# Patient Record
Sex: Female | Born: 1955 | Race: White | Hispanic: No | Marital: Married | Smoking: Never smoker
Health system: Southern US, Community
[De-identification: ages and names within clinical notes are randomized; demographics above are authoritative.]

## PROBLEM LIST (undated history)

## (undated) DIAGNOSIS — Z8619 Personal history of other infectious and parasitic diseases: Secondary | ICD-10-CM

## (undated) DIAGNOSIS — K8020 Calculus of gallbladder without cholecystitis without obstruction: Secondary | ICD-10-CM

## (undated) DIAGNOSIS — C50919 Malignant neoplasm of unspecified site of unspecified female breast: Secondary | ICD-10-CM

## (undated) DIAGNOSIS — Z9889 Other specified postprocedural states: Secondary | ICD-10-CM

## (undated) DIAGNOSIS — Z923 Personal history of irradiation: Secondary | ICD-10-CM

## (undated) DIAGNOSIS — Z853 Personal history of malignant neoplasm of breast: Secondary | ICD-10-CM

## (undated) DIAGNOSIS — Z9221 Personal history of antineoplastic chemotherapy: Secondary | ICD-10-CM

## (undated) DIAGNOSIS — Z8679 Personal history of other diseases of the circulatory system: Secondary | ICD-10-CM

## (undated) HISTORY — PX: OTHER SURGICAL HISTORY: SHX169

## (undated) HISTORY — DX: Calculus of gallbladder without cholecystitis without obstruction: K80.20

## (undated) HISTORY — DX: Personal history of malignant neoplasm of breast: Z85.3

## (undated) HISTORY — DX: Malignant neoplasm of unspecified site of unspecified female breast: C50.919

## (undated) HISTORY — PX: COLONOSCOPY: SHX174

## (undated) HISTORY — DX: Personal history of other infectious and parasitic diseases: Z86.19

## (undated) HISTORY — DX: Personal history of other diseases of the circulatory system: Z86.79

---

## 1999-09-04 ENCOUNTER — Other Ambulatory Visit: Admission: RE | Admit: 1999-09-04 | Discharge: 1999-09-04 | Payer: Self-pay | Admitting: Internal Medicine

## 2000-03-10 DIAGNOSIS — Z9221 Personal history of antineoplastic chemotherapy: Secondary | ICD-10-CM

## 2000-03-10 DIAGNOSIS — Z923 Personal history of irradiation: Secondary | ICD-10-CM

## 2000-03-10 HISTORY — DX: Personal history of irradiation: Z92.3

## 2000-03-10 HISTORY — DX: Personal history of antineoplastic chemotherapy: Z92.21

## 2000-03-10 HISTORY — PX: BREAST LUMPECTOMY: SHX2

## 2000-08-26 ENCOUNTER — Encounter: Payer: Self-pay | Admitting: Internal Medicine

## 2000-08-26 ENCOUNTER — Encounter: Admission: RE | Admit: 2000-08-26 | Discharge: 2000-08-26 | Payer: Self-pay | Admitting: Internal Medicine

## 2000-08-26 ENCOUNTER — Encounter (INDEPENDENT_AMBULATORY_CARE_PROVIDER_SITE_OTHER): Payer: Self-pay | Admitting: *Deleted

## 2000-08-26 ENCOUNTER — Other Ambulatory Visit: Admission: RE | Admit: 2000-08-26 | Discharge: 2000-08-26 | Payer: Self-pay | Admitting: Internal Medicine

## 2000-09-02 ENCOUNTER — Encounter: Admission: RE | Admit: 2000-09-02 | Discharge: 2000-09-02 | Payer: Self-pay | Admitting: General Surgery

## 2000-09-02 ENCOUNTER — Encounter: Payer: Self-pay | Admitting: General Surgery

## 2000-09-04 ENCOUNTER — Encounter (INDEPENDENT_AMBULATORY_CARE_PROVIDER_SITE_OTHER): Payer: Self-pay | Admitting: Specialist

## 2000-09-04 ENCOUNTER — Encounter: Payer: Self-pay | Admitting: General Surgery

## 2000-09-04 ENCOUNTER — Ambulatory Visit (HOSPITAL_BASED_OUTPATIENT_CLINIC_OR_DEPARTMENT_OTHER): Admission: RE | Admit: 2000-09-04 | Discharge: 2000-09-04 | Payer: Self-pay | Admitting: General Surgery

## 2000-09-11 ENCOUNTER — Ambulatory Visit: Admission: RE | Admit: 2000-09-11 | Discharge: 2000-12-10 | Payer: Self-pay | Admitting: Radiation Oncology

## 2000-09-29 ENCOUNTER — Encounter: Payer: Self-pay | Admitting: *Deleted

## 2000-09-29 ENCOUNTER — Encounter: Admission: RE | Admit: 2000-09-29 | Discharge: 2000-09-29 | Payer: Self-pay | Admitting: *Deleted

## 2000-10-02 ENCOUNTER — Encounter: Payer: Self-pay | Admitting: *Deleted

## 2000-10-02 ENCOUNTER — Ambulatory Visit (HOSPITAL_COMMUNITY): Admission: RE | Admit: 2000-10-02 | Discharge: 2000-10-02 | Payer: Self-pay | Admitting: *Deleted

## 2000-10-05 ENCOUNTER — Encounter: Payer: Self-pay | Admitting: General Surgery

## 2000-10-05 ENCOUNTER — Ambulatory Visit (HOSPITAL_BASED_OUTPATIENT_CLINIC_OR_DEPARTMENT_OTHER): Admission: RE | Admit: 2000-10-05 | Discharge: 2000-10-05 | Payer: Self-pay | Admitting: General Surgery

## 2000-10-26 ENCOUNTER — Other Ambulatory Visit: Admission: RE | Admit: 2000-10-26 | Discharge: 2000-10-26 | Payer: Self-pay | Admitting: Internal Medicine

## 2001-01-06 ENCOUNTER — Ambulatory Visit: Admission: RE | Admit: 2001-01-06 | Discharge: 2001-04-06 | Payer: Self-pay | Admitting: Radiation Oncology

## 2001-02-12 ENCOUNTER — Ambulatory Visit (HOSPITAL_BASED_OUTPATIENT_CLINIC_OR_DEPARTMENT_OTHER): Admission: RE | Admit: 2001-02-12 | Discharge: 2001-02-12 | Payer: Self-pay | Admitting: General Surgery

## 2001-08-31 ENCOUNTER — Encounter: Admission: RE | Admit: 2001-08-31 | Discharge: 2001-08-31 | Payer: Self-pay | Admitting: *Deleted

## 2001-08-31 ENCOUNTER — Encounter: Payer: Self-pay | Admitting: *Deleted

## 2001-11-03 ENCOUNTER — Other Ambulatory Visit: Admission: RE | Admit: 2001-11-03 | Discharge: 2001-11-03 | Payer: Self-pay | Admitting: Internal Medicine

## 2002-09-02 ENCOUNTER — Encounter: Admission: RE | Admit: 2002-09-02 | Discharge: 2002-09-02 | Payer: Self-pay | Admitting: *Deleted

## 2002-09-02 ENCOUNTER — Encounter: Payer: Self-pay | Admitting: *Deleted

## 2002-11-16 ENCOUNTER — Other Ambulatory Visit: Admission: RE | Admit: 2002-11-16 | Discharge: 2002-11-16 | Payer: Self-pay | Admitting: Internal Medicine

## 2003-03-07 ENCOUNTER — Encounter: Admission: RE | Admit: 2003-03-07 | Discharge: 2003-03-07 | Payer: Self-pay | Admitting: Oncology

## 2003-09-04 ENCOUNTER — Encounter: Admission: RE | Admit: 2003-09-04 | Discharge: 2003-09-04 | Payer: Self-pay | Admitting: Oncology

## 2004-01-05 ENCOUNTER — Other Ambulatory Visit: Admission: RE | Admit: 2004-01-05 | Discharge: 2004-01-05 | Payer: Self-pay | Admitting: Internal Medicine

## 2004-01-12 ENCOUNTER — Encounter: Admission: RE | Admit: 2004-01-12 | Discharge: 2004-01-12 | Payer: Self-pay | Admitting: Internal Medicine

## 2004-01-12 ENCOUNTER — Encounter: Payer: Self-pay | Admitting: Internal Medicine

## 2004-02-07 ENCOUNTER — Ambulatory Visit: Payer: Self-pay | Admitting: Oncology

## 2004-03-21 ENCOUNTER — Ambulatory Visit (HOSPITAL_COMMUNITY): Admission: RE | Admit: 2004-03-21 | Discharge: 2004-03-21 | Payer: Self-pay | Admitting: Oncology

## 2004-03-29 ENCOUNTER — Ambulatory Visit: Payer: Self-pay | Admitting: Oncology

## 2004-04-01 ENCOUNTER — Ambulatory Visit (HOSPITAL_COMMUNITY): Admission: RE | Admit: 2004-04-01 | Discharge: 2004-04-01 | Payer: Self-pay | Admitting: Oncology

## 2004-06-25 ENCOUNTER — Ambulatory Visit: Payer: Self-pay | Admitting: Oncology

## 2004-06-26 ENCOUNTER — Ambulatory Visit (HOSPITAL_COMMUNITY): Admission: RE | Admit: 2004-06-26 | Discharge: 2004-06-26 | Payer: Self-pay | Admitting: Oncology

## 2004-09-04 ENCOUNTER — Encounter: Admission: RE | Admit: 2004-09-04 | Discharge: 2004-09-04 | Payer: Self-pay | Admitting: Internal Medicine

## 2004-11-06 ENCOUNTER — Ambulatory Visit: Payer: Self-pay | Admitting: Internal Medicine

## 2004-11-12 ENCOUNTER — Encounter: Payer: Self-pay | Admitting: Internal Medicine

## 2004-11-12 ENCOUNTER — Other Ambulatory Visit: Admission: RE | Admit: 2004-11-12 | Discharge: 2004-11-12 | Payer: Self-pay | Admitting: Internal Medicine

## 2004-11-12 ENCOUNTER — Ambulatory Visit: Payer: Self-pay | Admitting: Internal Medicine

## 2004-12-31 ENCOUNTER — Ambulatory Visit: Payer: Self-pay | Admitting: Oncology

## 2005-01-08 HISTORY — PX: PARATHYROIDECTOMY: SHX19

## 2005-01-20 ENCOUNTER — Ambulatory Visit: Payer: Self-pay | Admitting: Family Medicine

## 2005-01-20 ENCOUNTER — Ambulatory Visit: Payer: Self-pay | Admitting: Internal Medicine

## 2005-02-17 ENCOUNTER — Ambulatory Visit: Payer: Self-pay | Admitting: Internal Medicine

## 2005-02-19 ENCOUNTER — Encounter (HOSPITAL_COMMUNITY): Admission: RE | Admit: 2005-02-19 | Discharge: 2005-05-20 | Payer: Self-pay | Admitting: Internal Medicine

## 2005-04-14 ENCOUNTER — Encounter (INDEPENDENT_AMBULATORY_CARE_PROVIDER_SITE_OTHER): Payer: Self-pay | Admitting: Specialist

## 2005-04-14 ENCOUNTER — Ambulatory Visit (HOSPITAL_COMMUNITY): Admission: RE | Admit: 2005-04-14 | Discharge: 2005-04-14 | Payer: Self-pay | Admitting: Surgery

## 2005-07-01 ENCOUNTER — Ambulatory Visit: Payer: Self-pay | Admitting: Oncology

## 2005-08-15 LAB — CBC WITH DIFFERENTIAL/PLATELET
BASO%: 0.4 % (ref 0.0–2.0)
Eosinophils Absolute: 0.1 10*3/uL (ref 0.0–0.5)
MONO#: 0.4 10*3/uL (ref 0.1–0.9)
NEUT#: 3.7 10*3/uL (ref 1.5–6.5)
RBC: 4.47 10*6/uL (ref 3.70–5.32)
RDW: 13.7 % (ref 11.3–14.5)
WBC: 6.2 10*3/uL (ref 3.9–10.0)

## 2005-08-15 LAB — COMPREHENSIVE METABOLIC PANEL
ALT: 36 U/L (ref 0–40)
Albumin: 4.4 g/dL (ref 3.5–5.2)
Alkaline Phosphatase: 132 U/L — ABNORMAL HIGH (ref 39–117)
CO2: 31 mEq/L (ref 19–32)
Glucose, Bld: 110 mg/dL — ABNORMAL HIGH (ref 70–99)
Potassium: 4.3 mEq/L (ref 3.5–5.3)
Sodium: 146 mEq/L — ABNORMAL HIGH (ref 135–145)
Total Protein: 7.1 g/dL (ref 6.0–8.3)

## 2005-08-21 ENCOUNTER — Encounter: Admission: RE | Admit: 2005-08-21 | Discharge: 2005-08-21 | Payer: Self-pay | Admitting: Oncology

## 2005-08-24 ENCOUNTER — Encounter: Admission: RE | Admit: 2005-08-24 | Discharge: 2005-08-24 | Payer: Self-pay | Admitting: Oncology

## 2005-12-26 ENCOUNTER — Ambulatory Visit: Payer: Self-pay | Admitting: Internal Medicine

## 2005-12-26 LAB — CONVERTED CEMR LAB
ALT: 41 units/L — ABNORMAL HIGH (ref 0–40)
AST: 31 units/L (ref 0–37)
Albumin: 4.1 g/dL (ref 3.5–5.2)
Alkaline Phosphatase: 113 units/L (ref 39–117)
BUN: 16 mg/dL (ref 6–23)
Basophils Absolute: 0 10*3/uL (ref 0.0–0.1)
Basophils Relative: 0.7 % (ref 0.0–1.0)
CO2: 28 meq/L (ref 19–32)
Calcium: 9.7 mg/dL (ref 8.4–10.5)
Chloride: 106 meq/L (ref 96–112)
Chol/HDL Ratio, serum: 3.4
Cholesterol: 148 mg/dL (ref 0–200)
Creatinine, Ser: 0.8 mg/dL (ref 0.4–1.2)
Eosinophil percent: 1.9 % (ref 0.0–5.0)
GFR calc non Af Amer: 81 mL/min
Glomerular Filtration Rate, Af Am: 98 mL/min/{1.73_m2}
Glucose, Bld: 89 mg/dL (ref 70–99)
HCT: 40.7 % (ref 36.0–46.0)
HDL: 43.2 mg/dL (ref >39.0)
Hemoglobin: 13.3 g/dL (ref 12.0–15.0)
LDL Cholesterol: 92 mg/dL (ref 0–99)
Lymphocytes Relative: 28.8 % (ref 12.0–46.0)
MCHC: 32.7 g/dL (ref 30.0–36.0)
MCV: 86.7 fL (ref 78.0–100.0)
Monocytes Absolute: 0.5 10*3/uL (ref 0.2–0.7)
Monocytes Relative: 7.8 % (ref 3.0–11.0)
Neutro Abs: 4.2 10*3/uL (ref 1.4–7.7)
Neutrophils Relative %: 60.8 % (ref 43.0–77.0)
Platelets: 339 10*3/uL (ref 150–400)
Potassium: 4.4 meq/L (ref 3.5–5.1)
RBC: 4.7 M/uL (ref 3.87–5.11)
RDW: 13.1 % (ref 11.5–14.6)
Sodium: 140 meq/L (ref 135–145)
TSH: 1.2 microintl units/mL (ref 0.35–5.50)
Total Bilirubin: 0.4 mg/dL (ref 0.3–1.2)
Total Protein: 7.2 g/dL (ref 6.0–8.3)
Triglyceride fasting, serum: 62 mg/dL (ref 0–149)
VLDL: 12 mg/dL (ref 0–40)
WBC: 6.7 10*3/uL (ref 4.5–10.5)

## 2006-01-02 ENCOUNTER — Other Ambulatory Visit: Admission: RE | Admit: 2006-01-02 | Discharge: 2006-01-02 | Payer: Self-pay | Admitting: Internal Medicine

## 2006-01-02 ENCOUNTER — Encounter: Payer: Self-pay | Admitting: Internal Medicine

## 2006-01-02 ENCOUNTER — Ambulatory Visit: Payer: Self-pay | Admitting: Internal Medicine

## 2006-02-10 ENCOUNTER — Ambulatory Visit: Payer: Self-pay | Admitting: Oncology

## 2006-02-13 ENCOUNTER — Encounter: Payer: Self-pay | Admitting: *Deleted

## 2006-02-13 LAB — CBC WITH DIFFERENTIAL/PLATELET
Eosinophils Absolute: 0.1 10*3/uL (ref 0.0–0.5)
LYMPH%: 31.5 % (ref 14.0–48.0)
MONO#: 0.5 10*3/uL (ref 0.1–0.9)
NEUT#: 4.5 10*3/uL (ref 1.5–6.5)
Platelets: 390 10*3/uL (ref 145–400)
RBC: 4.64 10*6/uL (ref 3.70–5.32)
WBC: 7.5 10*3/uL (ref 3.9–10.0)
lymph#: 2.4 10*3/uL (ref 0.9–3.3)

## 2006-02-13 LAB — COMPREHENSIVE METABOLIC PANEL
Albumin: 4.7 g/dL (ref 3.5–5.2)
CO2: 28 mEq/L (ref 19–32)
Calcium: 10.8 mg/dL — ABNORMAL HIGH (ref 8.4–10.5)
Chloride: 100 mEq/L (ref 96–112)
Glucose, Bld: 99 mg/dL (ref 70–99)
Potassium: 4.2 mEq/L (ref 3.5–5.3)
Sodium: 139 mEq/L (ref 135–145)
Total Protein: 7.4 g/dL (ref 6.0–8.3)

## 2006-02-13 LAB — CONVERTED CEMR LAB: Calcium: 10.8 mg/dL

## 2006-08-11 ENCOUNTER — Ambulatory Visit: Payer: Self-pay | Admitting: Oncology

## 2006-08-26 ENCOUNTER — Encounter: Admission: RE | Admit: 2006-08-26 | Discharge: 2006-08-26 | Payer: Self-pay | Admitting: Oncology

## 2006-10-05 ENCOUNTER — Ambulatory Visit: Payer: Self-pay | Admitting: Oncology

## 2006-11-05 DIAGNOSIS — M810 Age-related osteoporosis without current pathological fracture: Secondary | ICD-10-CM | POA: Insufficient documentation

## 2006-11-05 DIAGNOSIS — J309 Allergic rhinitis, unspecified: Secondary | ICD-10-CM | POA: Insufficient documentation

## 2006-12-01 ENCOUNTER — Ambulatory Visit: Payer: Self-pay | Admitting: Internal Medicine

## 2006-12-01 LAB — CONVERTED CEMR LAB
ALT: 51 units/L — ABNORMAL HIGH (ref 0–35)
AST: 37 units/L (ref 0–37)
Albumin: 4.2 g/dL (ref 3.5–5.2)
Alkaline Phosphatase: 113 units/L (ref 39–117)
BUN: 9 mg/dL (ref 6–23)
Basophils Absolute: 0 10*3/uL (ref 0.0–0.1)
Basophils Relative: 0 % (ref 0.0–1.0)
Bilirubin Urine: NEGATIVE
Bilirubin, Direct: 0.1 mg/dL (ref 0.0–0.3)
CO2: 29 meq/L (ref 19–32)
Calcium: 10.1 mg/dL (ref 8.4–10.5)
Chloride: 105 meq/L (ref 96–112)
Cholesterol: 164 mg/dL (ref 0–200)
Creatinine, Ser: 0.7 mg/dL (ref 0.4–1.2)
Eosinophils Absolute: 0.2 10*3/uL (ref 0.0–0.6)
Eosinophils Relative: 2.4 % (ref 0.0–5.0)
GFR calc Af Amer: 114 mL/min
GFR calc non Af Amer: 95 mL/min
Glucose, Bld: 89 mg/dL (ref 70–99)
Glucose, Urine, Semiquant: NEGATIVE
HCT: 37.7 % (ref 36.0–46.0)
HDL: 43.5 mg/dL (ref >39.0)
Hemoglobin: 13.2 g/dL (ref 12.0–15.0)
Ketones, urine, test strip: NEGATIVE
LDL Cholesterol: 100 mg/dL — ABNORMAL HIGH (ref 0–99)
Lymphocytes Relative: 27.4 % (ref 12.0–46.0)
MCHC: 35.1 g/dL (ref 30.0–36.0)
MCV: 86.4 fL (ref 78.0–100.0)
Monocytes Absolute: 0.6 10*3/uL (ref 0.2–0.7)
Monocytes Relative: 8.4 % (ref 3.0–11.0)
Neutro Abs: 4.5 10*3/uL (ref 1.4–7.7)
Neutrophils Relative %: 61.8 % (ref 43.0–77.0)
Nitrite: NEGATIVE
Platelets: 354 10*3/uL (ref 150–400)
Potassium: 4.6 meq/L (ref 3.5–5.1)
Protein, U semiquant: NEGATIVE
RBC: 4.36 M/uL (ref 3.87–5.11)
RDW: 13.2 % (ref 11.5–14.6)
Sodium: 141 meq/L (ref 135–145)
Specific Gravity, Urine: 1.025
TSH: 1.27 microintl units/mL (ref 0.35–5.50)
Total Bilirubin: 0.6 mg/dL (ref 0.3–1.2)
Total CHOL/HDL Ratio: 3.8
Total Protein: 7 g/dL (ref 6.0–8.3)
Triglycerides: 105 mg/dL (ref 0–149)
Urobilinogen, UA: 0.2
VLDL: 21 mg/dL (ref 0–40)
WBC Urine, dipstick: NEGATIVE
WBC: 7.3 10*3/uL (ref 4.5–10.5)
pH: 5.5

## 2006-12-08 ENCOUNTER — Encounter: Payer: Self-pay | Admitting: Internal Medicine

## 2006-12-08 ENCOUNTER — Ambulatory Visit: Payer: Self-pay | Admitting: Internal Medicine

## 2006-12-08 ENCOUNTER — Other Ambulatory Visit: Admission: RE | Admit: 2006-12-08 | Discharge: 2006-12-08 | Payer: Self-pay | Admitting: Internal Medicine

## 2006-12-08 DIAGNOSIS — R740 Nonspecific elevation of levels of transaminase and lactic acid dehydrogenase [LDH]: Secondary | ICD-10-CM

## 2006-12-08 DIAGNOSIS — R7401 Elevation of levels of liver transaminase levels: Secondary | ICD-10-CM | POA: Insufficient documentation

## 2007-03-17 ENCOUNTER — Ambulatory Visit: Payer: Self-pay | Admitting: Internal Medicine

## 2007-03-17 ENCOUNTER — Encounter: Payer: Self-pay | Admitting: Internal Medicine

## 2007-03-19 ENCOUNTER — Ambulatory Visit: Payer: Self-pay | Admitting: Internal Medicine

## 2007-03-19 LAB — CONVERTED CEMR LAB
HCV Ab: NEGATIVE
Hep B C IgM: NEGATIVE
Hep B Core Total Ab: NEGATIVE
Hep B S Ab: NEGATIVE
Hepatitis B Surface Ag: NEGATIVE
Vit D, 1,25-Dihydroxy: 46 (ref 30–89)

## 2007-03-21 LAB — CONVERTED CEMR LAB
ALT: 23 units/L (ref 0–35)
AST: 21 units/L (ref 0–37)
Albumin: 4.1 g/dL (ref 3.5–5.2)
Alkaline Phosphatase: 104 units/L (ref 39–117)
Bilirubin, Direct: 0.1 mg/dL (ref 0.0–0.3)
Ferritin: 56.9 ng/mL (ref 10.0–291.0)
Total Bilirubin: 0.7 mg/dL (ref 0.3–1.2)
Total Protein: 6.9 g/dL (ref 6.0–8.3)

## 2007-03-31 ENCOUNTER — Ambulatory Visit: Payer: Self-pay | Admitting: Internal Medicine

## 2007-03-31 DIAGNOSIS — M899 Disorder of bone, unspecified: Secondary | ICD-10-CM | POA: Insufficient documentation

## 2007-03-31 DIAGNOSIS — Z8719 Personal history of other diseases of the digestive system: Secondary | ICD-10-CM | POA: Insufficient documentation

## 2007-03-31 DIAGNOSIS — M949 Disorder of cartilage, unspecified: Secondary | ICD-10-CM

## 2007-08-18 ENCOUNTER — Ambulatory Visit: Payer: Self-pay | Admitting: Internal Medicine

## 2007-08-30 ENCOUNTER — Encounter: Admission: RE | Admit: 2007-08-30 | Discharge: 2007-08-30 | Payer: Self-pay | Admitting: Oncology

## 2007-09-21 ENCOUNTER — Telehealth (INDEPENDENT_AMBULATORY_CARE_PROVIDER_SITE_OTHER): Payer: Self-pay | Admitting: *Deleted

## 2007-10-24 ENCOUNTER — Ambulatory Visit: Payer: Self-pay | Admitting: Oncology

## 2007-10-29 ENCOUNTER — Encounter: Payer: Self-pay | Admitting: *Deleted

## 2007-10-29 ENCOUNTER — Encounter: Payer: Self-pay | Admitting: Internal Medicine

## 2007-10-29 LAB — CONVERTED CEMR LAB
ALT: 29 units/L
AST: 21 units/L
Calcium: 10.7 mg/dL

## 2007-10-29 LAB — COMPREHENSIVE METABOLIC PANEL
CO2: 28 mEq/L (ref 19–32)
Creatinine, Ser: 0.75 mg/dL (ref 0.40–1.20)
Glucose, Bld: 95 mg/dL (ref 70–99)
Sodium: 140 mEq/L (ref 135–145)
Total Bilirubin: 0.4 mg/dL (ref 0.3–1.2)
Total Protein: 7.1 g/dL (ref 6.0–8.3)

## 2007-10-29 LAB — CBC WITH DIFFERENTIAL/PLATELET
Eosinophils Absolute: 0.1 10*3/uL (ref 0.0–0.5)
HCT: 38.2 % (ref 34.8–46.6)
HGB: 13 g/dL (ref 11.6–15.9)
LYMPH%: 28.2 % (ref 14.0–48.0)
MONO#: 0.5 10*3/uL (ref 0.1–0.9)
NEUT#: 4.4 10*3/uL (ref 1.5–6.5)
NEUT%: 62.3 % (ref 39.6–76.8)
Platelets: 324 10*3/uL (ref 145–400)
WBC: 7 10*3/uL (ref 3.9–10.0)

## 2008-04-06 ENCOUNTER — Ambulatory Visit: Payer: Self-pay | Admitting: Internal Medicine

## 2008-04-06 LAB — CONVERTED CEMR LAB
ALT: 23 units/L (ref 0–35)
AST: 23 units/L (ref 0–37)
Albumin: 4.1 g/dL (ref 3.5–5.2)
Alkaline Phosphatase: 76 units/L (ref 39–117)
BUN: 15 mg/dL (ref 6–23)
Basophils Absolute: 0 10*3/uL (ref 0.0–0.1)
Basophils Relative: 0.2 % (ref 0.0–3.0)
Bilirubin Urine: NEGATIVE
Bilirubin, Direct: 0.1 mg/dL (ref 0.0–0.3)
Blood in Urine, dipstick: NEGATIVE
CO2: 32 meq/L (ref 19–32)
Calcium: 9.9 mg/dL (ref 8.4–10.5)
Chloride: 106 meq/L (ref 96–112)
Cholesterol: 149 mg/dL (ref 0–200)
Creatinine, Ser: 0.7 mg/dL (ref 0.4–1.2)
Eosinophils Absolute: 0.1 10*3/uL (ref 0.0–0.7)
Eosinophils Relative: 2.1 % (ref 0.0–5.0)
GFR calc Af Amer: 114 mL/min
GFR calc non Af Amer: 94 mL/min
Glucose, Bld: 89 mg/dL (ref 70–99)
Glucose, Urine, Semiquant: NEGATIVE
HCT: 40.4 % (ref 36.0–46.0)
HDL: 47.2 mg/dL (ref >39.0)
Hemoglobin: 13.7 g/dL (ref 12.0–15.0)
LDL Cholesterol: 86 mg/dL (ref 0–99)
Lymphocytes Relative: 29.1 % (ref 12.0–46.0)
MCHC: 33.8 g/dL (ref 30.0–36.0)
MCV: 87.7 fL (ref 78.0–100.0)
Monocytes Absolute: 0.5 10*3/uL (ref 0.1–1.0)
Monocytes Relative: 8.8 % (ref 3.0–12.0)
Neutro Abs: 3.6 10*3/uL (ref 1.4–7.7)
Neutrophils Relative %: 59.8 % (ref 43.0–77.0)
Nitrite: NEGATIVE
Platelets: 303 10*3/uL (ref 150–400)
Potassium: 4.5 meq/L (ref 3.5–5.1)
RBC: 4.61 M/uL (ref 3.87–5.11)
RDW: 13 % (ref 11.5–14.6)
Sodium: 144 meq/L (ref 135–145)
Specific Gravity, Urine: 1.02
TSH: 0.83 microintl units/mL (ref 0.35–5.50)
Total Bilirubin: 0.8 mg/dL (ref 0.3–1.2)
Total CHOL/HDL Ratio: 3.2
Total Protein: 7.2 g/dL (ref 6.0–8.3)
Triglycerides: 81 mg/dL (ref 0–149)
Urobilinogen, UA: 0.2
VLDL: 16 mg/dL (ref 0–40)
WBC: 5.9 10*3/uL (ref 4.5–10.5)
pH: 7

## 2008-04-17 ENCOUNTER — Ambulatory Visit: Payer: Self-pay | Admitting: Internal Medicine

## 2008-04-19 ENCOUNTER — Encounter: Payer: Self-pay | Admitting: Internal Medicine

## 2008-04-19 ENCOUNTER — Other Ambulatory Visit: Admission: RE | Admit: 2008-04-19 | Discharge: 2008-04-19 | Payer: Self-pay | Admitting: Internal Medicine

## 2008-04-26 ENCOUNTER — Ambulatory Visit: Payer: Self-pay | Admitting: Oncology

## 2008-04-28 ENCOUNTER — Encounter: Payer: Self-pay | Admitting: Internal Medicine

## 2008-04-28 ENCOUNTER — Encounter: Payer: Self-pay | Admitting: *Deleted

## 2008-04-28 LAB — CBC WITH DIFFERENTIAL/PLATELET
Eosinophils Absolute: 0.1 10*3/uL (ref 0.0–0.5)
HCT: 37.8 % (ref 34.8–46.6)
LYMPH%: 26.6 % (ref 14.0–49.7)
MCHC: 34.5 g/dL (ref 31.5–36.0)
MCV: 86.4 fL (ref 79.5–101.0)
MONO#: 0.5 10*3/uL (ref 0.1–0.9)
NEUT#: 4.5 10*3/uL (ref 1.5–6.5)
NEUT%: 64.5 % (ref 38.4–76.8)
Platelets: 340 10*3/uL (ref 145–400)
WBC: 7 10*3/uL (ref 3.9–10.3)

## 2008-04-28 LAB — CONVERTED CEMR LAB
ALT: 41 units/L
AST: 18 units/L
Alkaline Phosphatase: 80 units/L
Calcium: 10.6 mg/dL
Creatinine, Ser: 0.68 mg/dL
HCT: 37.8 %
Hemoglobin: 13 g/dL

## 2008-04-28 LAB — COMPREHENSIVE METABOLIC PANEL
CO2: 29 mEq/L (ref 19–32)
Creatinine, Ser: 0.68 mg/dL (ref 0.40–1.20)
Glucose, Bld: 91 mg/dL (ref 70–99)
Total Bilirubin: 0.4 mg/dL (ref 0.3–1.2)

## 2008-07-28 ENCOUNTER — Encounter: Payer: Self-pay | Admitting: *Deleted

## 2008-08-30 ENCOUNTER — Encounter: Admission: RE | Admit: 2008-08-30 | Discharge: 2008-08-30 | Payer: Self-pay | Admitting: Oncology

## 2008-10-11 ENCOUNTER — Ambulatory Visit: Payer: Self-pay | Admitting: Oncology

## 2010-04-01 ENCOUNTER — Encounter: Payer: Self-pay | Admitting: Oncology

## 2010-07-26 NOTE — Op Note (Signed)
Tracy Chen, Tracy Chen                ACCOUNT NO.:  [id number]   MEDICAL RECORD NO.:  [id number]          PATIENT TYPE:  AMB   LOCATION:  DAY                          FACILITY:  [facility]   PHYSICIAN:  Velora Heckler, MD      DATE OF BIRTH:  02/19/1956   DATE OF PROCEDURE:  04/14/2005  DATE OF DISCHARGE:                                 OPERATIVE REPORT   PREOPERATIVE DIAGNOSIS:  Primary hyperparathyroidism.   POSTOPERATIVE DIAGNOSIS:  Primary hyperparathyroidism.   PROCEDURE:  Minimally-invasive left inferior parathyroidectomy (image-  guided).   SURGEON:  Velora Heckler, M.D.   ASSISTANT:  Jerelene Redden, M.D.   ANESTHESIA:  General.   ESTIMATED BLOOD LOSS:  Minimal.   PREPARATION:  Betadine.   COMPLICATIONS:  None.   INDICATIONS:  The patient is a 55 year old white female from [place],  [place], referred by [employer]. Panosh, M.D. hypercalcemia.  The  patient was noted to have elevated serum calcium level of 11.2.  Parathyroid  hormone level was monitored and was elevated at 176.2.  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  The patient now comes to surgery for resection.   BODY OF REPORT.:  The procedure is done in OR #6 at the [facility].  The patient is brought to the operating room, placed in  a supine position on the operating room table.  Following administration of  general anesthesia, the patient is positioned and then prepped and draped in  the usual strict aseptic fashion.  After ascertaining that an adequate level  of anesthesia been obtained, a left inferior incision is made with a #15  blade.  Dissection is carried down through subcutaneous tissues and  platysma.  Skin flaps are developed.  Strap muscles are incised in the  midline and  reflected laterally.  The left thyroid lobe is exposed.  Venous  tributaries are divided between small and medium hemoclips.  The left lobe  is rolled anteriorly.  In the tracheoesophageal groove is an abnormally  enlarged parathyroid gland consistent with adenoma.  It is gently dissected  out.  Vascular structures are divided between small hemoclips.  The gland is  excised in its entirety.  It measures 3 x 1.5 x 1.5 cm in size.  It is  submitted to pathology, where frozen section confirms parathyroid tissue  consistent with adenoma.  Good hemostasis is noted.  The neck is irrigated  with warm saline.  Surgicel is placed in the bed of the parathyroid gland.  Strap muscles are reapproximated midline with interrupted 3-0 Vicryl  sutures.  Platysma is closed with interrupted 3-0 Vicryl sutures.  The skin  is anesthetized with local anesthetic.  Skin edges are reapproximated with a  running 4-0  Vicryl subcuticular suture.  The wound is washed and dried and Benzoin and  Steri-Strips are applied.  Sterile dressings are applied.  The patient is  awakened from anesthesia and brought to the recovery room in stable  condition.  The patient tolerated the procedure well.      Velora Heckler, MD  Electronically Signed     TMG/MEDQ  D:  04/14/2005  T:  04/14/2005  Job:  [id number]   cc:   Neta Mends. Fabian Sharp, M.D. [facility]  [address] [place]  [place] [address]

## 2010-07-26 NOTE — Op Note (Signed)
[place]  Patient:    Tracy Chen, Tracy Chen                         MRN: [id number] Proc. Date: 10/05/00 Adm. Date:  [id number] Disc. Date: [id number] Attending:  Janalyn Rouse                           Operative Report  PREOPERATIVE DIAGNOSIS:  Carcinoma of the left breast.  POSTOPERATIVE DIAGNOSIS:  Carcinoma of the left breast.  OPERATION PERFORMED:  Port-A-Cath placement.  SURGEON:  Rose Phi. Maple Hudson, M.D.  ANESTHESIA:  MAC  DESCRIPTION OF PROCEDURE:  The patient was placed on the operating table with a roll between the shoulders and upper chest and neck prepped and draped in the usual fashion.  Under local anesthesia, a right subclavian puncture was carried out without difficulty with passage of the guide wire and its proper positioning in the vena cava confirmed by fluoroscopy.  We then made an incision on the anterior chest wall and developed a pocket for the implantable port.  We then tunneled between the subclavian puncture site and the implantable port site and passed the preattached Bard catheter through this. The port was then anchored into the pocket with two 2-0 Prolene sutures.  The catheter tip was trimmed to  the appropriate length and then a dilator and peel-away sheath were passed over the wire.  The wire was removed followed by the dilator and we passed the catheter through the peel-away sheath and then removed it.  Positioning of the catheter was then confirmed by the fluoroscopy with proper position of the tip and no kinking.  We then fully heparinized the system and closed the pocket with a 3-0 Vicryl and a subcuticular 4-0 Monocryl and then Steri-Strips.  Dressings applied. The patient was transferred to the recovery room in satisfactory condition, having tolerated the procedure well. DD:  10/05/00 TD:  10/05/00 Job: [id number]

## 2010-07-26 NOTE — Op Note (Signed)
[place]  Patient:    Tracy Chen, Tracy Chen                         MRN: [id number] Adm. Date:  [id number] Attending:  Janalyn Rouse CC:         Neta Mends. Panosh, M.D. [facility]  Dr. Doloris Hall   Operative Report  PREOPERATIVE DIAGNOSIS:  Carcinoma of the left breast.  POSTOPERATIVE DIAGNOSIS:  Carcinoma of the left breast.  PROCEDURE:  Blue dye injection.  Left axillary sentinel lymph node biopsy. Left partial mastectomy.  SURGEON:  Rose Phi. Maple Hudson, M.D.  ANESTHESIA:  General anesthesia.  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  Prior to coming on the operating room, 1 millicurie of technezium sulfur colloid was injected intradermally in the periareolar location.  After anesthesia had been induced, I injected 4 cc of lymphozuran blue in the subareolar tissue and the breast was compressed for five minutes.  After suitable general anesthesia was induced, the patient was placed in the supine position with the left arm extended on the armboard.  After injecting the blue dye, we prepped and draped in the usual sterile fashion.  We carefully scanned the breast and the internal mammary chain supraclavicular area and the left axilla with the neoprobe and there was one hot spot deep in the left axilla.  A short transverse incision was made with dissection through the subcutaneous tissue down to the clavopectoral fascia.  Hemostasis obtained with the cautery.  We opened the clavopectoral fascia and there were three blue and very hot lymph nodes which were excised as three sentinel nodes. There were no other palpable blue or hot nodes.  While those nodes were being examined, I then outlined an elliptical incision for the subareolar tumor which incorporated the nipple areolar complex.  This was done in a transverse fashion.  We then made the incision  and then I did an excision of the central portion of the breast.  After removing the specimen, I oriented it for the pathologist and it was submitted for touchpreps.  The sentinel nodes were negative and the touchprep on the margins were negative. With good hemostasis, we closed the deeper layers of the breast with 3-0 Vicryl and the skin with subcuticular 4-0 Monocryl and Steri-Strips.  The axillary incision was closed in a similar fashion, dressing applied.  The patient transferred to the recovery room in satisfactory condition having tolerated the procedure well. DD:  09/04/00 TD:  09/04/00 Job: [id number]

## 2010-08-14 ENCOUNTER — Other Ambulatory Visit: Payer: Self-pay | Admitting: Internal Medicine

## 2010-08-14 DIAGNOSIS — Z1231 Encounter for screening mammogram for malignant neoplasm of breast: Secondary | ICD-10-CM

## 2010-08-23 ENCOUNTER — Ambulatory Visit
Admission: RE | Admit: 2010-08-23 | Discharge: 2010-08-23 | Disposition: A | Discharge status: Home / Self Care | Payer: BC Managed Care – PPO | Source: Ambulatory Visit | Attending: Internal Medicine | Admitting: Internal Medicine

## 2010-08-23 DIAGNOSIS — Z1231 Encounter for screening mammogram for malignant neoplasm of breast: Secondary | ICD-10-CM

## 2010-09-23 ENCOUNTER — Encounter: Payer: Self-pay | Admitting: Internal Medicine

## 2010-09-24 ENCOUNTER — Ambulatory Visit (INDEPENDENT_AMBULATORY_CARE_PROVIDER_SITE_OTHER): Payer: BC Managed Care – PPO | Admitting: Internal Medicine

## 2010-09-24 ENCOUNTER — Encounter: Payer: Self-pay | Admitting: Internal Medicine

## 2010-09-24 VITALS — BP 130/80 | HR 78 | Temp 98.5°F | Ht 67.0 in | Wt 170.0 lb

## 2010-09-24 DIAGNOSIS — Z0000 Encounter for general adult medical examination without abnormal findings: Secondary | ICD-10-CM

## 2010-09-24 DIAGNOSIS — Z136 Encounter for screening for cardiovascular disorders: Secondary | ICD-10-CM

## 2010-09-24 DIAGNOSIS — Z01419 Encounter for gynecological examination (general) (routine) without abnormal findings: Secondary | ICD-10-CM

## 2010-09-24 LAB — CBC WITH DIFFERENTIAL/PLATELET
Basophils Relative: 0.5 % (ref 0.0–3.0)
Eosinophils Relative: 1.7 % (ref 0.0–5.0)
HCT: 36.8 % (ref 36.0–46.0)
Hemoglobin: 12.4 g/dL (ref 12.0–15.0)
Lymphs Abs: 1.7 10*3/uL (ref 0.7–4.0)
Monocytes Relative: 6.5 % (ref 3.0–12.0)
Platelets: 291 10*3/uL (ref 150.0–400.0)
RBC: 4.21 Mil/uL (ref 3.87–5.11)
WBC: 5.7 10*3/uL (ref 4.5–10.5)

## 2010-09-24 LAB — HEPATIC FUNCTION PANEL
ALT: 23 U/L (ref 0–35)
AST: 25 U/L (ref 0–37)
Alkaline Phosphatase: 92 U/L (ref 39–117)
Bilirubin, Direct: 0 mg/dL (ref 0.0–0.3)
Total Bilirubin: 0.9 mg/dL (ref 0.3–1.2)
Total Protein: 6.9 g/dL (ref 6.0–8.3)

## 2010-09-24 LAB — BASIC METABOLIC PANEL
CO2: 29 mEq/L (ref 19–32)
Chloride: 108 mEq/L (ref 96–112)
Creatinine, Ser: 0.7 mg/dL (ref 0.4–1.2)
Potassium: 3.7 mEq/L (ref 3.5–5.1)
Sodium: 141 mEq/L (ref 135–145)

## 2010-09-24 LAB — LIPID PANEL
LDL Cholesterol: 93 mg/dL (ref 0–99)
Total CHOL/HDL Ratio: 3

## 2010-09-24 LAB — TSH: TSH: 0.59 u[IU]/mL (ref 0.35–5.50)

## 2010-09-24 MED ORDER — ALENDRONATE SODIUM 70 MG PO TABS: 70.0000 mg | ORAL_TABLET | ORAL | Status: DC

## 2010-09-24 NOTE — Patient Instructions (Signed)
Will notify you  of labs when available. If ok then check up in a year. Continue   lifestyle intervention healthy eating and exercise .   calcium vit d in diet  Check on colonoscopy and contact us if we need to do a referral.

## 2010-09-24 NOTE — Progress Notes (Signed)
  Subjective:    Patient ID: Tracy Chen, female    DOB: 02/28/1956, 55 y.o.   MRN: [id number]  HPI Patient comes in today for Preventive Health Care visit  She missed her check up last year as she had no insurance . No major change in health status since last visit . Had mammogram that was ok ? If due for colonoscopy. No injury or hospitalization. last visit with Dr Darrold Span 2 years ago   Stopped the [place] on her own   . Through it was time to stop anyway. Dx of breast cancer was  2002  No fractures  Stopped th fosamax  Ran out  Has been on this less than 5 years ? Has a gallstone  No symptoms .  Review of Systems ROS:  GEN/ HEENTNo fever, significant weight changes sweats headaches vision problems hearing changes, CV/ PULM; No chest pain shortness of breath cough, syncope,edema  change in exercise tolerance. GI /GU: No adominal pain, vomiting, change in bowel habits. No blood in the stool. No significant GU symptoms. SKIN/HEME: ,no acute skin rashes suspicious lesions or bleeding. No lymphadenopathy, nodules, masses.  NEURO/ PSYCH:  No neurologic signs such as weakness numbness No depression anxiety. IMM/ Allergy: No unusual infections.  Allergy .   REST of 12 system review negative   Past history family history social history reviewed in the electronic medical record.     Objective:   Physical Exam Physical Exam: Vital signs reviewed JXB:JYNW is a well-developed well-nourished alert cooperative  white female who appears her stated age in no acute distress.  HEENT: normocephalic  traumatic , Eyes: PERRL EOM's full, conjunctiva clear, Nares: paten,t no deformity discharge or tenderness., Ears: no deformity EAC's clear TMs with normal landmarks. Mouth: clear OP, no lesions, edema.  Moist mucous membranes. Dentition in adequate repair. NECK: supple without masses, thyromegaly or bruits. Breast: normal by inspection . No dimpling, discharge, masses, tenderness or discharge   On right  .  Left nipple absent  No  Masses  CHEST/PULM:  Clear to auscultation and percussion breath sounds equal no wheeze , rales or rhonchi. No chest wall deformities or tenderness. CV: PMI is nondisplaced, S1 S2 no gallops, murmurs, rubs. Peripheral pulses are full without delay.No JVD .  ABDOMEN: Bowel sounds normal nontender  No guard or rebound, no hepato splenomegal no CVA tenderness.  No hernia. Extremtities:  No clubbing cyanosis or edema, no acute joint swelling or redness no focal atrophy NEURO:  Oriented x3, cranial nerves 3-12 appear to be intact, no obvious focal weakness,gait within normal limits no abnormal reflexes or asymmetrical SKIN: No acute rashes normal turgor, color, no bruising or petechiae. PSYCH: Oriented, good eye contact, no obvious depression anxiety, cognition and judgment appear normal. Pelvic: NL ext GU, labia clear without lesions or rash . Vagina no lesions .  UTERUS: Neg CMT Adnexa:  clear no masses . Rectal non tender. LN: no cervical axillary inguinal adenopathy Labs reviewed with patient.      Assessment & Plan:  Preventive Health Care Counseled regarding healthy nutrition, exercise, sleep, injury prevention, calcium vit d and healthy weight .   GYNE exam   UTD except ? Colon. Hx of breast cancer  Bone health  Get adequate vit d and calcium  Osteopenia  Allergies .  Hx gallstones  No  sx .

## 2010-09-28 ENCOUNTER — Encounter: Payer: Self-pay | Admitting: Internal Medicine

## 2010-09-28 DIAGNOSIS — Z01419 Encounter for gynecological examination (general) (routine) without abnormal findings: Secondary | ICD-10-CM | POA: Insufficient documentation

## 2010-09-30 ENCOUNTER — Encounter: Payer: Self-pay | Admitting: *Deleted

## 2012-11-03 ENCOUNTER — Other Ambulatory Visit: Payer: Self-pay

## 2012-11-03 DIAGNOSIS — Z1231 Encounter for screening mammogram for malignant neoplasm of breast: Secondary | ICD-10-CM

## 2012-11-12 ENCOUNTER — Ambulatory Visit (INDEPENDENT_AMBULATORY_CARE_PROVIDER_SITE_OTHER): Payer: BC Managed Care – PPO | Admitting: Internal Medicine

## 2012-11-12 ENCOUNTER — Other Ambulatory Visit (HOSPITAL_COMMUNITY)
Admission: RE | Admit: 2012-11-12 | Discharge: 2012-11-12 | Disposition: A | Discharge status: Home / Self Care | Payer: BC Managed Care – PPO | Source: Ambulatory Visit | Attending: Internal Medicine | Admitting: Internal Medicine

## 2012-11-12 ENCOUNTER — Encounter: Payer: Self-pay | Admitting: Internal Medicine

## 2012-11-12 VITALS — BP 138/78 | HR 82 | Temp 98.1°F | Ht 67.0 in | Wt 160.0 lb

## 2012-11-12 DIAGNOSIS — Z0000 Encounter for general adult medical examination without abnormal findings: Secondary | ICD-10-CM

## 2012-11-12 DIAGNOSIS — Z1211 Encounter for screening for malignant neoplasm of colon: Secondary | ICD-10-CM

## 2012-11-12 DIAGNOSIS — R011 Cardiac murmur, unspecified: Secondary | ICD-10-CM

## 2012-11-12 DIAGNOSIS — Z01419 Encounter for gynecological examination (general) (routine) without abnormal findings: Secondary | ICD-10-CM | POA: Insufficient documentation

## 2012-11-12 DIAGNOSIS — Z853 Personal history of malignant neoplasm of breast: Secondary | ICD-10-CM

## 2012-11-12 LAB — CBC WITH DIFFERENTIAL/PLATELET
Basophils Absolute: 0 10*3/uL (ref 0.0–0.1)
Eosinophils Relative: 2.8 % (ref 0.0–5.0)
HCT: 38.4 % (ref 36.0–46.0)
Lymphocytes Relative: 27.1 % (ref 12.0–46.0)
Lymphs Abs: 1.8 10*3/uL (ref 0.7–4.0)
Monocytes Relative: 6.8 % (ref 3.0–12.0)
Platelets: 326 10*3/uL (ref 150.0–400.0)
RDW: 13.7 % (ref 11.5–14.6)
WBC: 6.5 10*3/uL (ref 4.5–10.5)

## 2012-11-12 LAB — LIPID PANEL
LDL Cholesterol: 92 mg/dL (ref 0–99)
Total CHOL/HDL Ratio: 4
VLDL: 19.4 mg/dL (ref 0.0–40.0)

## 2012-11-12 LAB — BASIC METABOLIC PANEL
BUN: 17 mg/dL (ref 6–23)
Calcium: 9.3 mg/dL (ref 8.4–10.5)
GFR: 119.21 mL/min (ref >60.00)
Potassium: 3.8 mEq/L (ref 3.5–5.1)
Sodium: 139 mEq/L (ref 135–145)

## 2012-11-12 LAB — HEPATIC FUNCTION PANEL
ALT: 43 U/L — ABNORMAL HIGH (ref 0–35)
AST: 23 U/L (ref 0–37)
Alkaline Phosphatase: 90 U/L (ref 39–117)
Bilirubin, Direct: 0 mg/dL (ref 0.0–0.3)
Total Bilirubin: 0.5 mg/dL (ref 0.3–1.2)

## 2012-11-12 LAB — TSH: TSH: 1.05 u[IU]/mL (ref 0.35–5.50)

## 2012-11-12 NOTE — Progress Notes (Signed)
Chief Complaint  Patient presents with  . Annual Exam    HPI: Patient comes in today for Preventive Health Care visit  Last visit was 2012 she lost her health insurance and now has a new one [employer]. She is over 10 years out on her breast cancer diagnoses. On a routine mammogram schedule Once a year .  At breast center  Had one colonoscopy when 47  Due for colonoscopy. But the lady because of no insurance. Declined flu vaccine  She feels well no major injuries. She is on no prescription medicines at this time. Works 2 jobs. No history of a murmur no chest pain shortness of breath exercise intolerance ; younger brother was diagnosed a little while ago with a heart murmur picked up on exam some type of valve problem and being followed. ROS:  GEN/ HEENT: No fever, significant weight changes sweats headaches vision problems hearing changes, CV/ PULM; No chest pain shortness of breath cough, syncope,edema  change in exercise tolerance. GI /GU: No adominal pain, vomiting, change in bowel habits. No blood in the stool. No significant GU symptoms. SKIN/HEME: ,no acute skin rashes suspicious lesions or bleeding. No lymphadenopathy, nodules, masses.  NEURO/ PSYCH:  No neurologic signs such as weakness numbness. No depression anxiety. IMM/ Allergy: No unusual infections.  Allergy .   REST of 12 system review negative except as per HPI   Past Medical History  Diagnosis Date  . Allergic rhinitis   . HX: breast cancer     hx of 2002 left er/pr 3.2cm  . Osteoporosis     L5  . History of chickenpox     as a child  . Gallstone   . Breast cancer     Family History  Problem Relation Age of Onset  . Osteoporosis Maternal Aunt   . Thyroid disease    . Heart murmur Brother     Picked up on exam followed by echo.    History   Social History  . Marital Status: Married    Spouse Name: N/A    Number of Children: N/A  . Years of Education: N/A   Social History Main Topics  . Smoking  status: Never Smoker   . Smokeless tobacco: None  . Alcohol Use:   . Drug Use:   . Sexual Activity:    Other Topics Concern  . None   Social History Narrative   Regular exercise- some   HH of 3   Pets outside goats and chicken.   1 cat    No ets.   Now working in [employer] office . 40 hours  And  Also  At [employer]   26+   Neg ets   G2P2   6.5-7 hours    Neg ta caffiene .     Outpatient Encounter Prescriptions as of 11/12/2012  Medication Sig Dispense Refill  . loratadine (CLARITIN) 10 MG tablet Take 10 mg by mouth daily. seasonal      . [DISCONTINUED] alendronate (FOSAMAX) 70 MG tablet Take 1 tablet (70 mg total) by mouth every 7 (seven) days. Take with a full glass of water on an empty stomach.  4 tablet  11   No facility-administered encounter medications on file as of 11/12/2012.    EXAM:  BP 138/78  Pulse 82  Temp(Src) 98.1 F (36.7 C) (Oral)  Ht 5\' 7"  (1.702 m)  Wt 160 lb (72.576 kg)  BMI 25.05 kg/m2  SpO2 98%  Body mass index is 25.05  kg/(m^2).  Physical Exam: Vital signs reviewed [id number] is a well-developed well-nourished alert cooperative   female who appears her stated age in no acute distress.  HEENT: normocephalic atraumatic , Eyes: PERRL EOM's full, conjunctiva clear, Nares: paten,t no deformity discharge or tenderness., Ears: no deformity EAC's clear TMs with normal landmarks. Mouth: clear OP, no lesions, edema.  Moist mucous membranes. Dentition in adequate repair. NECK: supple without masses, thyromegaly or bruits. CHEST/PULM:  Clear to auscultation and percussion breath sounds equal no wheeze , rales or rhonchi. No chest wall deformities or tenderness. Breast: normal by inspection . No dimpling, discharge, masses, tenderness or discharge .right left no massses  Nipple absent no adenopathy  CV: PMI is nondisplaced, S1 S2 no gallops, , rubs. Peripheral pulses are full without delay.No JVD . whispy 2-3/6 short nurmur heard mid syst upper sb and along no  axillary radiation not into neck. ? Diastole clear? pmi nl  nl pulses  ABDOMEN: Bowel sounds normal nontender  No guard or rebound, no hepato splenomegal no CVA tenderness.  No hernia. Extremtities:  No clubbing cyanosis or edema, no acute joint swelling or redness no focal atrophy NEURO:  Oriented x3, cranial nerves 3-12 appear to be intact, no obvious focal weakness,gait within normal limits no abnormal reflexes or asymmetrical SKIN: No acute rashes normal turgor, color, no bruising or petechiae. Localized sunburn medial shin from kayaking PSYCH: Oriented, good eye contact, no obvious depression anxiety, cognition and judgment appear normal. LN: no cervical axillary inguinal adenopathy Pelvic: NL ext GU, labia clear without lesions or rash . Vagina no lesions .Cervix: clear  UTERUS: Neg CMT Adnexa:  clear no masses . PAP done. Rectal no masses minimal stool smear heme-negative   Lab Results  Component Value Date   WBC 6.5 11/12/2012   HGB 12.9 11/12/2012   HCT 38.4 11/12/2012   PLT 326.0 11/12/2012   GLUCOSE 92 11/12/2012   CHOL 149 11/12/2012   TRIG 97.0 11/12/2012   HDL 37.70* 11/12/2012   LDLCALC 92 11/12/2012   ALT 43* 11/12/2012   AST 23 11/12/2012   NA 139 11/12/2012   K 3.8 11/12/2012   CL 105 11/12/2012   CREATININE 0.6 11/12/2012   BUN 17 11/12/2012   CO2 30 11/12/2012   TSH 1.05 11/12/2012    ASSESSMENT AND PLAN:  Discussed the following assessment and plan:  Preventative health care - Declined flu vaccine today discussed maintenance did not address bone health at this visit today. - Plan: Basic metabolic panel, CBC with Differential, Hepatic function panel, Lipid panel, TSH, PAP [[place]]  Routine gynecological examination - Plan: Basic metabolic panel, CBC with Differential, Hepatic function panel, Lipid panel, TSH, PAP [[place]]  HX: breast cancer  Heart murmur previously undiagnosed - mid syst ? lusb and apx not radiating fam hx   heart valve murmur in younger brother.  - Plan: 2D  Echocardiogram without contrast  Special screening for malignant neoplasms, colon - Plan: Ambulatory referral to Gastroenterology Has a high deductible order put in in for echocardiogram can check out the cost options of rechecking exam seeing cardiology also. Her murmur seems different than a hyperdynamic murmur from anxiety. Either way we'll follow.  Patient Care Team: Madelin Headings, MD as PCP - General Iva Boop, MD (Gastroenterology) Reece Packer, MD (Hematology and Oncology) Patient Instructions  Continue lifestyle intervention healthy eating and exercise . Will order the echocardiogram for the evaluation of heart murmur but you can check with insurance about the  cost . Will  Do referral for colonoscopy also as you are due for this. Call breast center to get your mammogram they should contact us if order for imaging is needed . Yearly check unless need fu .   Preventive Care for Adults, Female A healthy lifestyle and preventive care can promote health and wellness. Preventive health guidelines for women include the following key practices.  A routine yearly physical is a good way to check with your caregiver about your health and preventive screening. It is a chance to share any concerns and updates on your health, and to receive a thorough exam.  Visit your dentist for a routine exam and preventive care every 6 months. Brush your teeth twice a day and floss once a day. Good oral hygiene prevents tooth decay and gum disease.  The frequency of eye exams is based on your age, health, family medical history, use of contact lenses, and other factors. Follow your caregiver's recommendations for frequency of eye exams.  Eat a healthy diet. Foods like vegetables, fruits, whole grains, low-fat dairy products, and lean protein foods contain the nutrients you need without too many calories. Decrease your intake of foods high in solid fats, added sugars, and salt. Eat the right amount of  calories for you.Get information about a proper diet from your caregiver, if necessary.  Regular physical exercise is one of the most important things you can do for your health. Most adults should get at least 150 minutes of moderate-intensity exercise (any activity that increases your heart rate and causes you to sweat) each week. In addition, most adults need muscle-strengthening exercises on 2 or more days a week.  Maintain a healthy weight. The body mass index (BMI) is a screening tool to identify possible weight problems. It provides an estimate of body fat based on height and weight. Your caregiver can help determine your BMI, and can help you achieve or maintain a healthy weight.For adults 20 years and older:  A BMI below 18.5 is considered underweight.  A BMI of 18.5 to 24.9 is normal.  A BMI of 25 to 29.9 is considered overweight.  A BMI of 30 and above is considered obese.  Maintain normal blood lipids and cholesterol levels by exercising and minimizing your intake of saturated fat. Eat a balanced diet with plenty of fruit and vegetables. Blood tests for lipids and cholesterol should begin at age 35 and be repeated every 5 years. If your lipid or cholesterol levels are high, you are over 50, or you are at high risk for heart disease, you may need your cholesterol levels checked more frequently.Ongoing high lipid and cholesterol levels should be treated with medicines if diet and exercise are not effective.  If you smoke, find out from your caregiver how to quit. If you do not use tobacco, do not start.  If you are pregnant, do not drink alcohol. If you are breastfeeding, be very cautious about drinking alcohol. If you are not pregnant and choose to drink alcohol, do not exceed 1 drink per day. One drink is considered to be 12 ounces (355 mL) of beer, 5 ounces (148 mL) of wine, or 1.5 ounces (44 mL) of liquor.  Avoid use of street drugs. Do not share needles with anyone. Ask for help  if you need support or instructions about stopping the use of drugs.  High blood pressure causes heart disease and increases the risk of stroke. Your blood pressure should be checked at least every 1  to 2 years. Ongoing high blood pressure should be treated with medicines if weight loss and exercise are not effective.  If you are 73 to 55 years old, ask your caregiver if you should take aspirin to prevent strokes.  Diabetes screening involves taking a blood sample to check your fasting blood sugar level. This should be done once every 3 years, after age 58, if you are within normal weight and without risk factors for diabetes. Testing should be considered at a younger age or be carried out more frequently if you are overweight and have at least 1 risk factor for diabetes.  Breast cancer screening is essential preventive care for women. You should practice "breast self-awareness." This means understanding the normal appearance and feel of your breasts and may include breast self-examination. Any changes detected, no matter how small, should be reported to a caregiver. Women in their 29s and 30s should have a clinical breast exam (CBE) by a caregiver as part of a regular health exam every 1 to 3 years. After age 59, women should have a CBE every year. Starting at age 38, women should consider having a mammography (breast X-ray test) every year. Women who have a family history of breast cancer should talk to their caregiver about genetic screening. Women at a high risk of breast cancer should talk to their caregivers about having magnetic resonance imaging (MRI) and a mammography every year.  The Pap test is a screening test for cervical cancer. A Pap test can show cell changes on the cervix that might become cervical cancer if left untreated. A Pap test is a procedure in which cells are obtained and examined from the lower end of the uterus (cervix).  Women should have a Pap test starting at age  11.  Between ages 72 and 22, Pap tests should be repeated every 2 years.  Beginning at age 31, you should have a Pap test every 3 years as long as the past 3 Pap tests have been normal.  Some women have medical problems that increase the chance of getting cervical cancer. Talk to your caregiver about these problems. It is especially important to talk to your caregiver if a new problem develops soon after your last Pap test. In these cases, your caregiver may recommend more frequent screening and Pap tests.  The above recommendations are the same for women who have or have not gotten the vaccine for human papillomavirus (HPV).  If you had a hysterectomy for a problem that was not cancer or a condition that could lead to cancer, then you no longer need Pap tests. Even if you no longer need a Pap test, a regular exam is a good idea to make sure no other problems are starting.  If you are between ages 55 and 44, and you have had normal Pap tests going back 10 years, you no longer need Pap tests. Even if you no longer need a Pap test, a regular exam is a good idea to make sure no other problems are starting.  If you have had past treatment for cervical cancer or a condition that could lead to cancer, you need Pap tests and screening for cancer for at least 20 years after your treatment.  If Pap tests have been discontinued, risk factors (such as a new sexual partner) need to be reassessed to determine if screening should be resumed.  The HPV test is an additional test that may be used for cervical cancer screening. The HPV test  looks for the virus that can cause the cell changes on the cervix. The cells collected during the Pap test can be tested for HPV. The HPV test could be used to screen women aged 44 years and older, and should be used in women of any age who have unclear Pap test results. After the age of 40, women should have HPV testing at the same frequency as a Pap test.  Colorectal cancer  can be detected and often prevented. Most routine colorectal cancer screening begins at the age of 2 and continues through age 59. However, your caregiver may recommend screening at an earlier age if you have risk factors for colon cancer. On a yearly basis, your caregiver may provide home test kits to check for hidden blood in the stool. Use of a small camera at the end of a tube, to directly examine the colon (sigmoidoscopy or colonoscopy), can detect the earliest forms of colorectal cancer. Talk to your caregiver about this at age 73, when routine screening begins. Direct examination of the colon should be repeated every 5 to 10 years through age 72, unless early forms of pre-cancerous polyps or small growths are found.  Hepatitis C blood testing is recommended for all people born from [birth year] through 1965 and any individual with known risks for hepatitis C.  Practice safe sex. Use condoms and avoid high-risk sexual practices to reduce the spread of sexually transmitted infections (STIs). STIs include gonorrhea, chlamydia, syphilis, trichomonas, herpes, HPV, and human immunodeficiency virus (HIV). Herpes, HIV, and HPV are viral illnesses that have no cure. They can result in disability, cancer, and death. Sexually active women aged 64 and younger should be checked for chlamydia. Older women with new or multiple partners should also be tested for chlamydia. Testing for other STIs is recommended if you are sexually active and at increased risk.  Osteoporosis is a disease in which the bones lose minerals and strength with aging. This can result in serious bone fractures. The risk of osteoporosis can be identified using a bone density scan. Women ages 45 and over and women at risk for fractures or osteoporosis should discuss screening with their caregivers. Ask your caregiver whether you should take a calcium supplement or vitamin D to reduce the rate of osteoporosis.  Menopause can be associated with physical  symptoms and risks. Hormone replacement therapy is available to decrease symptoms and risks. You should talk to your caregiver about whether hormone replacement therapy is right for you.  Use sunscreen with sun protection factor (SPF) of 30 or more. Apply sunscreen liberally and repeatedly throughout the day. You should seek shade when your shadow is shorter than you. Protect yourself by wearing long sleeves, pants, a wide-brimmed hat, and sunglasses year round, whenever you are outdoors.  Once a month, do a whole body skin exam, using a mirror to look at the skin on your back. Notify your caregiver of new moles, moles that have irregular borders, moles that are larger than a pencil eraser, or moles that have changed in shape or color.  Stay current with required immunizations.  Influenza. You need a dose every fall (or winter). The composition of the flu vaccine changes each year, so being vaccinated once is not enough.  Pneumococcal polysaccharide. You need 1 to 2 doses if you smoke cigarettes or if you have certain chronic medical conditions. You need 1 dose at age 51 (or older) if you have never been vaccinated.  Tetanus, diphtheria, pertussis (Tdap, Td). Get 1  dose of Tdap vaccine if you are younger than age 16, are over 11 and have contact with an infant, are a Research scientist (physical sciences), are pregnant, or simply want to be protected from whooping cough. After that, you need a Td booster dose every 10 years. Consult your caregiver if you have not had at least 3 tetanus and diphtheria-containing shots sometime in your life or have a deep or dirty wound.  HPV. You need this vaccine if you are a woman age 66 or younger. The vaccine is given in 3 doses over 6 months.  Measles, mumps, rubella (MMR). You need at least 1 dose of MMR if you were born in 1957 or later. You may also need a second dose.  Meningococcal. If you are age 59 to 67 and a first-year college student living in a residence hall, or have  one of several medical conditions, you need to get vaccinated against meningococcal disease. You may also need additional booster doses.  Zoster (shingles). If you are age 13 or older, you should get this vaccine.  Varicella (chickenpox). If you have never had chickenpox or you were vaccinated but received only 1 dose, talk to your caregiver to find out if you need this vaccine.  Hepatitis A. You need this vaccine if you have a specific risk factor for hepatitis A virus infection or you simply wish to be protected from this disease. The vaccine is usually given as 2 doses, 6 to 18 months apart.  Hepatitis B. You need this vaccine if you have a specific risk factor for hepatitis B virus infection or you simply wish to be protected from this disease. The vaccine is given in 3 doses, usually over 6 months. Preventive Services / Frequency Ages 76 to 15  Blood pressure check.** / Every 1 to 2 years.  Lipid and cholesterol check.** / Every 5 years beginning at age 39.  Clinical breast exam.** / Every 3 years for women in their 55s and 30s.  Pap test.** / Every 2 years from ages 53 through 13. Every 3 years starting at age 66 through age 63 or 11 with a history of 3 consecutive normal Pap tests.  HPV screening.** / Every 3 years from ages 43 through ages 12 to 103 with a history of 3 consecutive normal Pap tests.  Hepatitis C blood test.** / For any individual with known risks for hepatitis C.  Skin self-exam. / Monthly.  Influenza immunization.** / Every year.  Pneumococcal polysaccharide immunization.** / 1 to 2 doses if you smoke cigarettes or if you have certain chronic medical conditions.  Tetanus, diphtheria, pertussis (Tdap, Td) immunization. / A one-time dose of Tdap vaccine. After that, you need a Td booster dose every 10 years.  HPV immunization. / 3 doses over 6 months, if you are 29 and younger.  Measles, mumps, rubella (MMR) immunization. / You need at least 1 dose of MMR if  you were born in 1957 or later. You may also need a second dose.  Meningococcal immunization. / 1 dose if you are age 79 to 58 and a first-year college student living in a residence hall, or have one of several medical conditions, you need to get vaccinated against meningococcal disease. You may also need additional booster doses.  Varicella immunization.** / Consult your caregiver.  Hepatitis A immunization.** / Consult your caregiver. 2 doses, 6 to 18 months apart.  Hepatitis B immunization.** / Consult your caregiver. 3 doses usually over 6 months. Ages 28 to 60  Blood pressure check.** / Every 1 to 2 years.  Lipid and cholesterol check.** / Every 5 years beginning at age 77.  Clinical breast exam.** / Every year after age 56.  Mammogram.** / Every year beginning at age 64 and continuing for as long as you are in good health. Consult with your caregiver.  Pap test.** / Every 3 years starting at age 50 through age 57 or 85 with a history of 3 consecutive normal Pap tests.  HPV screening.** / Every 3 years from ages 62 through ages 22 to 79 with a history of 3 consecutive normal Pap tests.  Fecal occult blood test (FOBT) of stool. / Every year beginning at age 35 and continuing until age 64. You may not need to do this test if you get a colonoscopy every 10 years.  Flexible sigmoidoscopy or colonoscopy.** / Every 5 years for a flexible sigmoidoscopy or every 10 years for a colonoscopy beginning at age 59 and continuing until age 60.  Hepatitis C blood test.** / For all people born from [birth year] through 1963 and any individual with known risks for hepatitis C.  Skin self-exam. / Monthly.  Influenza immunization.** / Every year.  Pneumococcal polysaccharide immunization.** / 1 to 2 doses if you smoke cigarettes or if you have certain chronic medical conditions.  Tetanus, diphtheria, pertussis (Tdap, Td) immunization.** / A one-time dose of Tdap vaccine. After that, you need a Td booster  dose every 10 years.  Measles, mumps, rubella (MMR) immunization. / You need at least 1 dose of MMR if you were born in 1957 or later. You may also need a second dose.  Varicella immunization.** / Consult your caregiver.  Meningococcal immunization.** / Consult your caregiver.  Hepatitis A immunization.** / Consult your caregiver. 2 doses, 6 to 18 months apart.  Hepatitis B immunization.** / Consult your caregiver. 3 doses, usually over 6 months. Ages 42 and over  Blood pressure check.** / Every 1 to 2 years.  Lipid and cholesterol check.** / Every 5 years beginning at age 89.  Clinical breast exam.** / Every year after age 40.  Mammogram.** / Every year beginning at age 32 and continuing for as long as you are in good health. Consult with your caregiver.  Pap test.** / Every 3 years starting at age 19 through age 5 or 13 with a 3 consecutive normal Pap tests. Testing can be stopped between 65 and 70 with 3 consecutive normal Pap tests and no abnormal Pap or HPV tests in the past 10 years.  HPV screening.** / Every 3 years from ages 88 through ages 77 or 20 with a history of 3 consecutive normal Pap tests. Testing can be stopped between 65 and 70 with 3 consecutive normal Pap tests and no abnormal Pap or HPV tests in the past 10 years.  Fecal occult blood test (FOBT) of stool. / Every year beginning at age 77 and continuing until age 97. You may not need to do this test if you get a colonoscopy every 10 years.  Flexible sigmoidoscopy or colonoscopy.** / Every 5 years for a flexible sigmoidoscopy or every 10 years for a colonoscopy beginning at age 48 and continuing until age 21.  Hepatitis C blood test.** / For all people born from [birth year] through 1963 and any individual with known risks for hepatitis C.  Osteoporosis screening.** / A one-time screening for women ages 51 and over and women at risk for fractures or osteoporosis.  Skin self-exam. / Monthly.  Influenza  immunization.** /  Every year.  Pneumococcal polysaccharide immunization.** / 1 dose at age 22 (or older) if you have never been vaccinated.  Tetanus, diphtheria, pertussis (Tdap, Td) immunization. / A one-time dose of Tdap vaccine if you are over 65 and have contact with an infant, are a Research scientist (physical sciences), or simply want to be protected from whooping cough. After that, you need a Td booster dose every 10 years.  Varicella immunization.** / Consult your caregiver.  Meningococcal immunization.** / Consult your caregiver.  Hepatitis A immunization.** / Consult your caregiver. 2 doses, 6 to 18 months apart.  Hepatitis B immunization.** / Check with your caregiver. 3 doses, usually over 6 months. ** Family history and personal history of risk and conditions may change your caregiver's recommendations. Document Released: 04/22/2001 Document Revised: 05/19/2011 Document Reviewed: 07/22/2010 [facility] Patient Information 2014 [place], [place].     Neta Mends. Panosh M.D.   Health Maintenance  Topic Date Due  . Pap Smear  04/20/2011  . Mammogram  08/22/2012  . Colonoscopy  09/07/2012  . Influenza Vaccine  11/08/2013  . Tetanus/tdap  11/13/2014   Health Maintenance Review

## 2012-11-12 NOTE — Patient Instructions (Signed)
Continue lifestyle intervention healthy eating and exercise . Will order the echocardiogram for the evaluation of heart murmur but you can check with insurance about the cost . Will  Do referral for colonoscopy also as you are due for this. Call breast center to get your mammogram they should contact us if order for imaging is needed . Yearly check unless need fu .   Preventive Care for Adults, Female A healthy lifestyle and preventive care can promote health and wellness. Preventive health guidelines for women include the following key practices.  A routine yearly physical is a good way to check with your caregiver about your health and preventive screening. It is a chance to share any concerns and updates on your health, and to receive a thorough exam.  Visit your dentist for a routine exam and preventive care every 6 months. Brush your teeth twice a day and floss once a day. Good oral hygiene prevents tooth decay and gum disease.  The frequency of eye exams is based on your age, health, family medical history, use of contact lenses, and other factors. Follow your caregiver's recommendations for frequency of eye exams.  Eat a healthy diet. Foods like vegetables, fruits, whole grains, low-fat dairy products, and lean protein foods contain the nutrients you need without too many calories. Decrease your intake of foods high in solid fats, added sugars, and salt. Eat the right amount of calories for you.Get information about a proper diet from your caregiver, if necessary.  Regular physical exercise is one of the most important things you can do for your health. Most adults should get at least 150 minutes of moderate-intensity exercise (any activity that increases your heart rate and causes you to sweat) each week. In addition, most adults need muscle-strengthening exercises on 2 or more days a week.  Maintain a healthy weight. The body mass index (BMI) is a screening tool to identify possible  weight problems. It provides an estimate of body fat based on height and weight. Your caregiver can help determine your BMI, and can help you achieve or maintain a healthy weight.For adults 20 years and older:  A BMI below 18.5 is considered underweight.  A BMI of 18.5 to 24.9 is normal.  A BMI of 25 to 29.9 is considered overweight.  A BMI of 30 and above is considered obese.  Maintain normal blood lipids and cholesterol levels by exercising and minimizing your intake of saturated fat. Eat a balanced diet with plenty of fruit and vegetables. Blood tests for lipids and cholesterol should begin at age 4 and be repeated every 5 years. If your lipid or cholesterol levels are high, you are over 50, or you are at high risk for heart disease, you may need your cholesterol levels checked more frequently.Ongoing high lipid and cholesterol levels should be treated with medicines if diet and exercise are not effective.  If you smoke, find out from your caregiver how to quit. If you do not use tobacco, do not start.  If you are pregnant, do not drink alcohol. If you are breastfeeding, be very cautious about drinking alcohol. If you are not pregnant and choose to drink alcohol, do not exceed 1 drink per day. One drink is considered to be 12 ounces (355 mL) of beer, 5 ounces (148 mL) of wine, or 1.5 ounces (44 mL) of liquor.  Avoid use of street drugs. Do not share needles with anyone. Ask for help if you need support or instructions about stopping the use  of drugs.  High blood pressure causes heart disease and increases the risk of stroke. Your blood pressure should be checked at least every 1 to 2 years. Ongoing high blood pressure should be treated with medicines if weight loss and exercise are not effective.  If you are 32 to 55 years old, ask your caregiver if you should take aspirin to prevent strokes.  Diabetes screening involves taking a blood sample to check your fasting blood sugar level. This  should be done once every 3 years, after age 1, if you are within normal weight and without risk factors for diabetes. Testing should be considered at a younger age or be carried out more frequently if you are overweight and have at least 1 risk factor for diabetes.  Breast cancer screening is essential preventive care for women. You should practice "breast self-awareness." This means understanding the normal appearance and feel of your breasts and may include breast self-examination. Any changes detected, no matter how small, should be reported to a caregiver. Women in their 50s and 30s should have a clinical breast exam (CBE) by a caregiver as part of a regular health exam every 1 to 3 years. After age 29, women should have a CBE every year. Starting at age 58, women should consider having a mammography (breast X-ray test) every year. Women who have a family history of breast cancer should talk to their caregiver about genetic screening. Women at a high risk of breast cancer should talk to their caregivers about having magnetic resonance imaging (MRI) and a mammography every year.  The Pap test is a screening test for cervical cancer. A Pap test can show cell changes on the cervix that might become cervical cancer if left untreated. A Pap test is a procedure in which cells are obtained and examined from the lower end of the uterus (cervix).  Women should have a Pap test starting at age 39.  Between ages 63 and 54, Pap tests should be repeated every 2 years.  Beginning at age 49, you should have a Pap test every 3 years as long as the past 3 Pap tests have been normal.  Some women have medical problems that increase the chance of getting cervical cancer. Talk to your caregiver about these problems. It is especially important to talk to your caregiver if a new problem develops soon after your last Pap test. In these cases, your caregiver may recommend more frequent screening and Pap tests.  The above  recommendations are the same for women who have or have not gotten the vaccine for human papillomavirus (HPV).  If you had a hysterectomy for a problem that was not cancer or a condition that could lead to cancer, then you no longer need Pap tests. Even if you no longer need a Pap test, a regular exam is a good idea to make sure no other problems are starting.  If you are between ages 16 and 55, and you have had normal Pap tests going back 10 years, you no longer need Pap tests. Even if you no longer need a Pap test, a regular exam is a good idea to make sure no other problems are starting.  If you have had past treatment for cervical cancer or a condition that could lead to cancer, you need Pap tests and screening for cancer for at least 20 years after your treatment.  If Pap tests have been discontinued, risk factors (such as a new sexual partner) need to be reassessed  to determine if screening should be resumed.  The HPV test is an additional test that may be used for cervical cancer screening. The HPV test looks for the virus that can cause the cell changes on the cervix. The cells collected during the Pap test can be tested for HPV. The HPV test could be used to screen women aged 60 years and older, and should be used in women of any age who have unclear Pap test results. After the age of 15, women should have HPV testing at the same frequency as a Pap test.  Colorectal cancer can be detected and often prevented. Most routine colorectal cancer screening begins at the age of 40 and continues through age 40. However, your caregiver may recommend screening at an earlier age if you have risk factors for colon cancer. On a yearly basis, your caregiver may provide home test kits to check for hidden blood in the stool. Use of a small camera at the end of a tube, to directly examine the colon (sigmoidoscopy or colonoscopy), can detect the earliest forms of colorectal cancer. Talk to your caregiver about  this at age 35, when routine screening begins. Direct examination of the colon should be repeated every 5 to 10 years through age 9, unless early forms of pre-cancerous polyps or small growths are found.  Hepatitis C blood testing is recommended for all people born from [birth year] through 1965 and any individual with known risks for hepatitis C.  Practice safe sex. Use condoms and avoid high-risk sexual practices to reduce the spread of sexually transmitted infections (STIs). STIs include gonorrhea, chlamydia, syphilis, trichomonas, herpes, HPV, and human immunodeficiency virus (HIV). Herpes, HIV, and HPV are viral illnesses that have no cure. They can result in disability, cancer, and death. Sexually active women aged 39 and younger should be checked for chlamydia. Older women with new or multiple partners should also be tested for chlamydia. Testing for other STIs is recommended if you are sexually active and at increased risk.  Osteoporosis is a disease in which the bones lose minerals and strength with aging. This can result in serious bone fractures. The risk of osteoporosis can be identified using a bone density scan. Women ages 33 and over and women at risk for fractures or osteoporosis should discuss screening with their caregivers. Ask your caregiver whether you should take a calcium supplement or vitamin D to reduce the rate of osteoporosis.  Menopause can be associated with physical symptoms and risks. Hormone replacement therapy is available to decrease symptoms and risks. You should talk to your caregiver about whether hormone replacement therapy is right for you.  Use sunscreen with sun protection factor (SPF) of 30 or more. Apply sunscreen liberally and repeatedly throughout the day. You should seek shade when your shadow is shorter than you. Protect yourself by wearing long sleeves, pants, a wide-brimmed hat, and sunglasses year round, whenever you are outdoors.  Once a month, do a whole body  skin exam, using a mirror to look at the skin on your back. Notify your caregiver of new moles, moles that have irregular borders, moles that are larger than a pencil eraser, or moles that have changed in shape or color.  Stay current with required immunizations.  Influenza. You need a dose every fall (or winter). The composition of the flu vaccine changes each year, so being vaccinated once is not enough.  Pneumococcal polysaccharide. You need 1 to 2 doses if you smoke cigarettes or if you have certain  chronic medical conditions. You need 1 dose at age 78 (or older) if you have never been vaccinated.  Tetanus, diphtheria, pertussis (Tdap, Td). Get 1 dose of Tdap vaccine if you are younger than age 46, are over 60 and have contact with an infant, are a Research scientist (physical sciences), are pregnant, or simply want to be protected from whooping cough. After that, you need a Td booster dose every 10 years. Consult your caregiver if you have not had at least 3 tetanus and diphtheria-containing shots sometime in your life or have a deep or dirty wound.  HPV. You need this vaccine if you are a woman age 23 or younger. The vaccine is given in 3 doses over 6 months.  Measles, mumps, rubella (MMR). You need at least 1 dose of MMR if you were born in 1957 or later. You may also need a second dose.  Meningococcal. If you are age 90 to 50 and a first-year college student living in a residence hall, or have one of several medical conditions, you need to get vaccinated against meningococcal disease. You may also need additional booster doses.  Zoster (shingles). If you are age 55 or older, you should get this vaccine.  Varicella (chickenpox). If you have never had chickenpox or you were vaccinated but received only 1 dose, talk to your caregiver to find out if you need this vaccine.  Hepatitis A. You need this vaccine if you have a specific risk factor for hepatitis A virus infection or you simply wish to be protected from  this disease. The vaccine is usually given as 2 doses, 6 to 18 months apart.  Hepatitis B. You need this vaccine if you have a specific risk factor for hepatitis B virus infection or you simply wish to be protected from this disease. The vaccine is given in 3 doses, usually over 6 months. Preventive Services / Frequency Ages 62 to 22  Blood pressure check.** / Every 1 to 2 years.  Lipid and cholesterol check.** / Every 5 years beginning at age 74.  Clinical breast exam.** / Every 3 years for women in their 31s and 30s.  Pap test.** / Every 2 years from ages 29 through 51. Every 3 years starting at age 72 through age 38 or 48 with a history of 3 consecutive normal Pap tests.  HPV screening.** / Every 3 years from ages 73 through ages 6 to 67 with a history of 3 consecutive normal Pap tests.  Hepatitis C blood test.** / For any individual with known risks for hepatitis C.  Skin self-exam. / Monthly.  Influenza immunization.** / Every year.  Pneumococcal polysaccharide immunization.** / 1 to 2 doses if you smoke cigarettes or if you have certain chronic medical conditions.  Tetanus, diphtheria, pertussis (Tdap, Td) immunization. / A one-time dose of Tdap vaccine. After that, you need a Td booster dose every 10 years.  HPV immunization. / 3 doses over 6 months, if you are 80 and younger.  Measles, mumps, rubella (MMR) immunization. / You need at least 1 dose of MMR if you were born in 1957 or later. You may also need a second dose.  Meningococcal immunization. / 1 dose if you are age 14 to 23 and a first-year college student living in a residence hall, or have one of several medical conditions, you need to get vaccinated against meningococcal disease. You may also need additional booster doses.  Varicella immunization.** / Consult your caregiver.  Hepatitis A immunization.** / Consult your caregiver.  2 doses, 6 to 18 months apart.  Hepatitis B immunization.** / Consult your  caregiver. 3 doses usually over 6 months. Ages 37 to 39  Blood pressure check.** / Every 1 to 2 years.  Lipid and cholesterol check.** / Every 5 years beginning at age 39.  Clinical breast exam.** / Every year after age 61.  Mammogram.** / Every year beginning at age 39 and continuing for as long as you are in good health. Consult with your caregiver.  Pap test.** / Every 3 years starting at age 60 through age 34 or 66 with a history of 3 consecutive normal Pap tests.  HPV screening.** / Every 3 years from ages 65 through ages 74 to 22 with a history of 3 consecutive normal Pap tests.  Fecal occult blood test (FOBT) of stool. / Every year beginning at age 50 and continuing until age 71. You may not need to do this test if you get a colonoscopy every 10 years.  Flexible sigmoidoscopy or colonoscopy.** / Every 5 years for a flexible sigmoidoscopy or every 10 years for a colonoscopy beginning at age 75 and continuing until age 51.  Hepatitis C blood test.** / For all people born from [birth year] through 1963 and any individual with known risks for hepatitis C.  Skin self-exam. / Monthly.  Influenza immunization.** / Every year.  Pneumococcal polysaccharide immunization.** / 1 to 2 doses if you smoke cigarettes or if you have certain chronic medical conditions.  Tetanus, diphtheria, pertussis (Tdap, Td) immunization.** / A one-time dose of Tdap vaccine. After that, you need a Td booster dose every 10 years.  Measles, mumps, rubella (MMR) immunization. / You need at least 1 dose of MMR if you were born in 1957 or later. You may also need a second dose.  Varicella immunization.** / Consult your caregiver.  Meningococcal immunization.** / Consult your caregiver.  Hepatitis A immunization.** / Consult your caregiver. 2 doses, 6 to 18 months apart.  Hepatitis B immunization.** / Consult your caregiver. 3 doses, usually over 6 months. Ages 67 and over  Blood pressure check.** / Every 1 to 2  years.  Lipid and cholesterol check.** / Every 5 years beginning at age 31.  Clinical breast exam.** / Every year after age 69.  Mammogram.** / Every year beginning at age 6 and continuing for as long as you are in good health. Consult with your caregiver.  Pap test.** / Every 3 years starting at age 23 through age 22 or 78 with a 3 consecutive normal Pap tests. Testing can be stopped between 65 and 70 with 3 consecutive normal Pap tests and no abnormal Pap or HPV tests in the past 10 years.  HPV screening.** / Every 3 years from ages 34 through ages 59 or 65 with a history of 3 consecutive normal Pap tests. Testing can be stopped between 65 and 70 with 3 consecutive normal Pap tests and no abnormal Pap or HPV tests in the past 10 years.  Fecal occult blood test (FOBT) of stool. / Every year beginning at age 44 and continuing until age 10. You may not need to do this test if you get a colonoscopy every 10 years.  Flexible sigmoidoscopy or colonoscopy.** / Every 5 years for a flexible sigmoidoscopy or every 10 years for a colonoscopy beginning at age 51 and continuing until age 76.  Hepatitis C blood test.** / For all people born from [birth year] through 1963 and any individual with known risks for hepatitis C.  Osteoporosis screening.** / A one-time screening for women ages 51 and over and women at risk for fractures or osteoporosis.  Skin self-exam. / Monthly.  Influenza immunization.** / Every year.  Pneumococcal polysaccharide immunization.** / 1 dose at age 51 (or older) if you have never been vaccinated.  Tetanus, diphtheria, pertussis (Tdap, Td) immunization. / A one-time dose of Tdap vaccine if you are over 65 and have contact with an infant, are a Research scientist (physical sciences), or simply want to be protected from whooping cough. After that, you need a Td booster dose every 10 years.  Varicella immunization.** / Consult your caregiver.  Meningococcal immunization.** / Consult your  caregiver.  Hepatitis A immunization.** / Consult your caregiver. 2 doses, 6 to 18 months apart.  Hepatitis B immunization.** / Check with your caregiver. 3 doses, usually over 6 months. ** Family history and personal history of risk and conditions may change your caregiver's recommendations. Document Released: 04/22/2001 Document Revised: 05/19/2011 Document Reviewed: 07/22/2010 [facility] Patient Information 2014 [place], [place].

## 2012-11-15 ENCOUNTER — Telehealth: Payer: Self-pay | Admitting: Internal Medicine

## 2012-11-15 NOTE — Telephone Encounter (Signed)
Note not needed.  See lab results.

## 2012-11-15 NOTE — Telephone Encounter (Signed)
Pt returning your call

## 2012-11-17 NOTE — Progress Notes (Signed)
Quick Note:  Tell patient PAP is normal. ______ 

## 2012-11-18 ENCOUNTER — Encounter: Payer: Self-pay | Admitting: Family Medicine

## 2012-11-19 ENCOUNTER — Encounter: Payer: Self-pay | Admitting: Internal Medicine

## 2012-11-19 ENCOUNTER — Ambulatory Visit
Admission: RE | Admit: 2012-11-19 | Discharge: 2012-11-19 | Disposition: A | Discharge status: Home / Self Care | Payer: BC Managed Care – PPO | Source: Ambulatory Visit

## 2012-11-19 DIAGNOSIS — Z1231 Encounter for screening mammogram for malignant neoplasm of breast: Secondary | ICD-10-CM

## 2012-11-25 ENCOUNTER — Telehealth: Payer: Self-pay | Admitting: Internal Medicine

## 2012-11-25 NOTE — Telephone Encounter (Signed)
Pt  Left me a voicemail stating that she did not want to have the ECHO until 2015 due to her insurance deductible. Pt said that she also did not want to jump into the ECHO and she felt like it would be best to come back in 2015 and see Dr. Fabian Sharp and have to listen to her heart again. Pt is not set up for an ECHO at this time please advise

## 2012-11-25 NOTE — Telephone Encounter (Signed)
I agree with that plan. 

## 2012-11-26 NOTE — Telephone Encounter (Signed)
Left message on home # that [facility] agreed with her plan.  Will see her in 2015.

## 2012-11-30 ENCOUNTER — Other Ambulatory Visit (HOSPITAL_COMMUNITY): Payer: BC Managed Care – PPO

## 2013-11-01 ENCOUNTER — Telehealth: Payer: Self-pay | Admitting: Internal Medicine

## 2013-11-01 ENCOUNTER — Other Ambulatory Visit: Payer: Self-pay

## 2013-11-01 DIAGNOSIS — Z1231 Encounter for screening mammogram for malignant neoplasm of breast: Secondary | ICD-10-CM

## 2013-11-01 NOTE — Telephone Encounter (Signed)
Pt is needing cpe in sept, no available phy slots, ok to use to slots?

## 2013-11-01 NOTE — Telephone Encounter (Signed)
Ok to make a slot

## 2013-11-02 NOTE — Telephone Encounter (Signed)
appt scheduled for pt.  

## 2013-11-17 ENCOUNTER — Encounter: Payer: Self-pay | Admitting: Internal Medicine

## 2013-11-17 ENCOUNTER — Ambulatory Visit (INDEPENDENT_AMBULATORY_CARE_PROVIDER_SITE_OTHER): Payer: BC Managed Care – PPO | Admitting: Internal Medicine

## 2013-11-17 VITALS — BP 135/85 | Temp 98.2°F | Ht 66.5 in | Wt 155.7 lb

## 2013-11-17 DIAGNOSIS — IMO0001 Reserved for inherently not codable concepts without codable children: Secondary | ICD-10-CM

## 2013-11-17 DIAGNOSIS — L989 Disorder of the skin and subcutaneous tissue, unspecified: Secondary | ICD-10-CM

## 2013-11-17 DIAGNOSIS — R740 Nonspecific elevation of levels of transaminase and lactic acid dehydrogenase [LDH]: Secondary | ICD-10-CM

## 2013-11-17 DIAGNOSIS — R7401 Elevation of levels of liver transaminase levels: Secondary | ICD-10-CM

## 2013-11-17 DIAGNOSIS — R030 Elevated blood-pressure reading, without diagnosis of hypertension: Secondary | ICD-10-CM

## 2013-11-17 DIAGNOSIS — Z853 Personal history of malignant neoplasm of breast: Secondary | ICD-10-CM

## 2013-11-17 DIAGNOSIS — Z0000 Encounter for general adult medical examination without abnormal findings: Secondary | ICD-10-CM | POA: Insufficient documentation

## 2013-11-17 DIAGNOSIS — R7402 Elevation of levels of lactic acid dehydrogenase (LDH): Secondary | ICD-10-CM

## 2013-11-17 LAB — LIPID PANEL
CHOL/HDL RATIO: 4
Cholesterol: 162 mg/dL (ref 0–200)
HDL: 45.2 mg/dL (ref >39.00)
LDL Cholesterol: 103 mg/dL — ABNORMAL HIGH (ref 0–99)
NONHDL: 116.8
Triglycerides: 70 mg/dL (ref 0.0–149.0)
VLDL: 14 mg/dL (ref 0.0–40.0)

## 2013-11-17 LAB — HEPATIC FUNCTION PANEL
ALBUMIN: 4.2 g/dL (ref 3.5–5.2)
ALT: 22 U/L (ref 0–35)
AST: 23 U/L (ref 0–37)
Alkaline Phosphatase: 92 U/L (ref 39–117)
Bilirubin, Direct: 0.1 mg/dL (ref 0.0–0.3)
Total Bilirubin: 0.5 mg/dL (ref 0.2–1.2)
Total Protein: 7.6 g/dL (ref 6.0–8.3)

## 2013-11-17 NOTE — Patient Instructions (Addendum)
Get colonscopy  As you ar planning   Plan bone density  Next year.    Take blood pressure readings twice a day for 7- 10 days and then periodically .To ensure below 140/90   .Send in readings  .  Relax for about 5 minutes .   before readings sitting.   Repeat liver panel and cholesterol  Today . Will notify you  of labs when available.  Healthy lifestyle includes : At least 150 minutes of exercise weeks  , weight at healthy levels, which is usually   BMI 19-25. Avoid trans fats and processed foods;  Increase fresh fruits and veges to 5 servings per day. And avoid sweet beverages including tea and juice. Mediterranean diet with olive oil and nuts have been noted to be heart and brain healthy . Avoid tobacco products . Limit  alcohol to  7 per week for women and 14 servings for men.  Get adequate sleep . Wear seat belts . Don't text and drive .  Consider fluy vaccine based on  converstaion of high value low risk  Intervention  If you want can make appt for "mole removal) try wart med on leg lesion and or see  Derm at [facility]

## 2013-11-17 NOTE — Progress Notes (Signed)
Pre visit review using our clinic review tool, if applicable. No additional management support is needed unless otherwise documented below in the visit note.  Chief Complaint  Patient presents with  . Annual Exam    HPI: Patient comes in today for Preventive Health Care visit  She is a 78 her ALT married nonsmoking lady status post diagnosis breast cancer no major changes in her health history since her last visit. She denies significant depression is getting her mammogram next week declined flu vaccine is unsure about this. Has colonoscopy screening which is overdue on her list to do with no symptoms. 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  Health Maintenance  Topic Date Due  . Colonoscopy  09/07/2012  . Influenza Vaccine  11/09/2014 (Originally 10/08/2013)  . Tetanus/tdap  11/13/2014  . Mammogram  11/20/2014  . Pap Smear  11/13/2015   Health Maintenance Review LIFESTYLE:  Exercise:   Work second job. Physical job. Bike  Tobacco/ETS:    No  Alcohol: none  Sugar beverages:   Soda 1 + per day.  Sleep:   About 7-8 hours  Drug use: no  Bone density:  Osteopenia .    Colonoscopy:  Due ?  Told 5 years.    ROS:  GEN/ HEENT: No fever, significant weight changes sweats headaches vision problems hearing changes, CV/ PULM; No chest pain shortness of breath cough, syncope,edema  change in exercise tolerance. GI /GU: No adominal pain, vomiting, change in bowel habits. No blood in the stool. No significant GU symptoms. SKIN/HEME: ,no acute skin rashes suspicious lesions or bleeding. No lymphadenopathy, nodules, masses. She does have an area that scaling on her right lower leg that is fairly new wonders if it should be treated or removed she has one that's been on her left arm for years also consider removal. NEURO/ PSYCH:  No neurologic signs such as weakness numbness. No depression anxiety. IMM/ Allergy: No  unusual infections.  Allergy .   REST of 12 system review negative except as per HPI   Past Medical History  Diagnosis Date  . Allergic rhinitis   . HX: breast cancer     hx of 2002 left er/pr 3.2cm  . Osteoporosis     L5  . History of chickenpox     as a child  . Gallstone   . Breast cancer     Family History  Problem Relation Age of Onset  . Osteoporosis Maternal Aunt   . Thyroid disease    . Heart murmur Brother     Picked up on exam followed by echo.    History   Social History  . Marital Status: Married    Spouse Name: N/A    Number of Children: N/A  . Years of Education: N/A   Social History Main Topics  . Smoking status: Never Smoker   . Smokeless tobacco: None  . Alcohol Use:   . Drug Use:   . Sexual Activity:    Other Topics Concern  . None   Social History Narrative   Regular exercise- some   HH of 3     Pets outside goats and chicken.   1 cat    No ets.   Now working in [employer] office . 40 hours  And  Also  At [employer]   26+   Neg ets   G2P2   6.5-7 hours  Neg ta caffiene .     Outpatient Encounter Prescriptions as of 11/17/2013  Medication Sig  . [DISCONTINUED] loratadine (CLARITIN) 10 MG tablet Take 10 mg by mouth daily. seasonal    EXAM:  BP 135/85  Temp(Src) 98.2 F (36.8 C) (Oral)  Ht 5' 6.5" (1.689 m)  Wt 155 lb 11.2 oz (70.625 kg)  BMI 24.76 kg/m2  Body mass index is 24.76 kg/(m^2).  Physical Exam: Vital signs reviewed DGU:YQIH is a well-developed well-nourished alert cooperative    who appearsr stated age in no acute distress.  HEENT: normocephalic atraumatic , Eyes: PERRL EOM's full, conjunctiva clear, Nares: paten,t no deformity discharge or tenderness., Ears: no deformity EAC's clear TMs with normal landmarks. Mouth: clear OP, no lesions, edema.  Moist mucous membranes. Dentition in adequate repair. NECK: supple without masses, thyromegaly or bruits. CHEST/PULM:  Clear to auscultation and percussion breath sounds  equal no wheeze , rales or rhonchi. No chest wall deformities or tenderness. CV: PMI is nondisplaced, S1 S2 no gallops, murmurs, rubs. Peripheral pulses are full without delay.No JVD .  Breast exam no nodules or discharge left there some thickening at the area of her breast cancer but no masses noted in axilla is clear ABDOMEN: Bowel sounds normal nontender  No guard or rebound, no hepato splenomegal no CVA tenderness.  No hernia. Extremtities:  No clubbing cyanosis or edema, no acute joint swelling or redness no focal atrophy NEURO:  Oriented x3, cranial nerves 3-12 appear to be intact, no obvious focal weakness,gait within normal limits no abnormal reflexes or asymmetrical SKIN: No acute rashes normal turgor, color, no bruising or petechiae. Right lower extremity at 3 mm verrucous-like lesion around [facility]. Left forearm a 3 mm flat firm pink lesion mole smooth uniform PSYCH: Oriented, good eye contact, no obvious depression anxiety, cognition and judgment appear normal. LN: no cervical axillary inguinal adenopathy  Lab Results  Component Value Date   WBC 6.5 11/12/2012   HGB 12.9 11/12/2012   HCT 38.4 11/12/2012   PLT 326.0 11/12/2012   GLUCOSE 92 11/12/2012   CHOL 149 11/12/2012   TRIG 97.0 11/12/2012   HDL 37.70* 11/12/2012   LDLCALC 92 11/12/2012   ALT 43* 11/12/2012   AST 23 11/12/2012   NA 139 11/12/2012   K 3.8 11/12/2012   CL 105 11/12/2012   CREATININE 0.6 11/12/2012   BUN 17 11/12/2012   CO2 30 11/12/2012   TSH 1.05 11/12/2012    ASSESSMENT AND PLAN:  Discussed the following assessment and plan:  Visit for preventive health examination - Discussed preventive measures can do bone density next year mammae 1 colonoscopy. However reconsider the flu vaccine - Plan: Lipid panel, Hepatic function panel  TRANSAMINASES, SERUM, ELEVATED - hx same; neg serologyin past repeat  with lipids today - Plan: Lipid panel, Hepatic function panel  White coat hypertension - advise confrim normal readings and disc how  to do this if not controlled plan medicaition intervention  Skin lesion - 1 on leg looks for rachis but uncertain can be removed make appointment or see dermatologist can use over-the-counter wart  HX: breast cancer  Patient Care Team: Burnis Medin, MD as PCP - General Gatha Mayer, MD (Gastroenterology) Patient Instructions  Get colonscopy  As you ar planning   Plan bone density  Next year.    Take blood pressure readings twice a day for 7- 10 days and then periodically .To ensure below 140/90   .Send in readings  .  Relax  for about 5 minutes .   before readings sitting.   Repeat liver panel and cholesterol  Today . Will notify you  of labs when available.  Healthy lifestyle includes : At least 150 minutes of exercise weeks  , weight at healthy levels, which is usually   BMI 19-25. Avoid trans fats and processed foods;  Increase fresh fruits and veges to 5 servings per day. And avoid sweet beverages including tea and juice. Mediterranean diet with olive oil and nuts have been noted to be heart and brain healthy . Avoid tobacco products . Limit  alcohol to  7 per week for women and 14 servings for men.  Get adequate sleep . Wear seat belts . Don't text and drive .  Consider fluy vaccine based on  converstaion of high value low risk  Intervention  If you want can make appt for "mole removal) try wart med on leg lesion and or see  Derm at [facility]     [facility] K. Devine Dant M.D.

## 2013-11-22 ENCOUNTER — Ambulatory Visit
Admission: RE | Admit: 2013-11-22 | Discharge: 2013-11-22 | Disposition: A | Discharge status: Home / Self Care | Payer: BC Managed Care – PPO | Source: Ambulatory Visit

## 2013-11-22 DIAGNOSIS — Z1231 Encounter for screening mammogram for malignant neoplasm of breast: Secondary | ICD-10-CM

## 2013-12-02 ENCOUNTER — Encounter: Payer: Self-pay | Admitting: Internal Medicine

## 2014-01-12 ENCOUNTER — Telehealth: Payer: Self-pay | Admitting: Internal Medicine

## 2014-01-12 NOTE — Telephone Encounter (Signed)
Pt called to ask the status of some paper work that she dropped off on 12/22/13 . Pt had asked that the form be filled out and faxed back to her. She will refax another form

## 2014-01-12 NOTE — Telephone Encounter (Signed)
Will wait on other form.

## 2014-01-13 NOTE — Telephone Encounter (Signed)
Form was given to [facility] today .Marland Kitchen 01/13/14

## 2014-01-17 NOTE — Telephone Encounter (Signed)
Given back to Libby Maw per her request.

## 2014-01-17 NOTE — Telephone Encounter (Signed)
Paper work was fax to pt on 01/17/14

## 2014-03-17 ENCOUNTER — Encounter: Payer: Self-pay | Admitting: Internal Medicine

## 2014-05-15 ENCOUNTER — Ambulatory Visit (AMBULATORY_SURGERY_CENTER): Payer: Self-pay

## 2014-05-15 VITALS — Ht 67.5 in | Wt 154.0 lb

## 2014-05-15 DIAGNOSIS — Z860100 Personal history of colon polyps, unspecified: Secondary | ICD-10-CM

## 2014-05-15 NOTE — Progress Notes (Signed)
No allergies to eggs or soy No diet/weight loss meds No home oxygen No past problems with anesthesia  Has email  Emmi instructions given for colonoscopy 

## 2014-05-18 ENCOUNTER — Telehealth: Payer: Self-pay | Admitting: Internal Medicine

## 2014-05-18 NOTE — Telephone Encounter (Signed)
Pt is a gessner pt and her prep is all over the counter. Called and left detailed message on number given that identifies pt by first and last name that all her prep is over the counter. Instructed her to get a box of 5 mg dulcolax tabs, a 238 gram bottle of miralax and a 64 oz of gatorade that is not red or purple and to mix and use as per her PV instructions. Instructed pt these can be purchased at any pharmacy, [facility], or [facility] and for her to call with further questions or concerns. Number left to [facility] if necessary.  Lelan Pons PV

## 2014-05-23 ENCOUNTER — Ambulatory Visit (AMBULATORY_SURGERY_CENTER): Payer: 59 | Admitting: Internal Medicine

## 2014-05-23 ENCOUNTER — Encounter: Payer: Self-pay | Admitting: Internal Medicine

## 2014-05-23 VITALS — BP 129/90 | HR 73 | Temp 95.8°F | Resp 46 | Ht 67.5 in | Wt 154.0 lb

## 2014-05-23 DIAGNOSIS — Z1211 Encounter for screening for malignant neoplasm of colon: Secondary | ICD-10-CM

## 2014-05-23 DIAGNOSIS — K5731 Diverticulosis of large intestine without perforation or abscess with bleeding: Secondary | ICD-10-CM

## 2014-05-23 DIAGNOSIS — K621 Rectal polyp: Secondary | ICD-10-CM

## 2014-05-23 DIAGNOSIS — D128 Benign neoplasm of rectum: Secondary | ICD-10-CM

## 2014-05-23 MED ORDER — SODIUM CHLORIDE 0.9 % IV SOLN: 500.0000 mL | INTRAVENOUS | Status: DC

## 2014-05-23 NOTE — Op Note (Signed)
[place]  [address] [place], [address]   COLONOSCOPY PROCEDURE REPORT  PATIENT: Tracy Chen, Tracy Chen  MR#: [id number] BIRTHDATE: 12-07-1955 , 59  yrs. old GENDER: female ENDOSCOPIST: Gatha Mayer, MD, [facility] PROCEDURE DATE:  05/23/2014 PROCEDURE:   Colonoscopy, screening and Colonoscopy with snare polypectomy First Screening Colonoscopy - Avg.  risk and is 50 yrs.  old or older - No.  Prior Negative Screening - Now for repeat screening. 10 or more years since last screening  History of Adenoma - Now for follow-up colonoscopy & has been > or = to 3 yrs.  N/A ASA CLASS:   Class II INDICATIONS:Screening for colonic neoplasia and Colorectal Neoplasm Risk Assessment for this procedure is average risk. MEDICATIONS: Propofol 200 mg IV and Monitored anesthesia care  DESCRIPTION OF PROCEDURE:   After the risks benefits and alternatives of the procedure were thoroughly explained, informed consent was obtained.  The digital rectal exam revealed no abnormalities of the rectum.   The LB [id number]  endoscope was introduced through the anus and advanced to the cecum, which was identified by both the appendix and ileocecal valve. No adverse events experienced.   The quality of the prep was excellent. (MiraLax was used)  The instrument was then slowly withdrawn as the colon was fully examined.      COLON FINDINGS: A sessile polyp measuring 4 mm in size was found in the rectum.  A polypectomy was performed with a cold snare.  The resection was complete, the polyp tissue was completely retrieved and sent to histology.   There was diverticulosis noted in the sigmoid colon.   The examination was otherwise normal.  Retroflexed views revealed no abnormalities. The time to cecum = 2.7 Withdrawal time = 13.2   The scope was withdrawn and the procedure completed. COMPLICATIONS: There were no immediate complications.  ENDOSCOPIC IMPRESSION: 1.   4 mm Sessile polyp was  found in the rectum; polypectomy was performed with a cold snare 2.   Diverticulosis was noted in the sigmoid colon 3.   The examination was otherwise normal - excellent prep - second screening  RECOMMENDATIONS: Timing of repeat colonoscopy will be determined by pathology findings.  eSigned:  Gatha Mayer, MD, [facility] 05/23/2014 8:52 AM   cc: the Patient

## 2014-05-23 NOTE — Progress Notes (Signed)
To recovery and report to Nyra Capes, Therapist, sports. VSS

## 2014-05-23 NOTE — Progress Notes (Signed)
Called to room to assist during endoscopic procedure.  Patient ID and intended procedure confirmed with present staff. Received instructions for my participation in the procedure from the performing physician.  

## 2014-05-23 NOTE — Patient Instructions (Addendum)
I found and removed one tiny polyp. You also have a condition called diverticulosis - common and not usually a problem. Please read the handout provided.  I will let you know pathology results and when to have another routine colonoscopy by mail.  I appreciate the opportunity to care for you. Gatha Mayer, MD, FACG  YOU HAD AN ENDOSCOPIC PROCEDURE TODAY AT [facility]:   Refer to the procedure report that was given to you for any specific questions about what was found during the examination.  If the procedure report does not answer your questions, please call your gastroenterologist to clarify.  If you requested that your care partner not be given the details of your procedure findings, then the procedure report has been included in a sealed envelope for you to review at your convenience later.  YOU SHOULD EXPECT: Some feelings of bloating in the abdomen. Passage of more gas than usual.  Walking can help get rid of the air that was put into your GI tract during the procedure and reduce the bloating. If you had a lower endoscopy (such as a colonoscopy or flexible sigmoidoscopy) you may notice spotting of blood in your stool or on the toilet paper. If you underwent a bowel prep for your procedure, you may not have a normal bowel movement for a few days.  Please Note:  You might notice some irritation and congestion in your nose or some drainage.  This is from the oxygen used during your procedure.  There is no need for concern and it should clear up in a day or so.  SYMPTOMS TO REPORT IMMEDIATELY:   Following lower endoscopy (colonoscopy or flexible sigmoidoscopy):  Excessive amounts of blood in the stool  Significant tenderness or worsening of abdominal pains  Swelling of the abdomen that is new, acute  Fever of 100F or higher   For urgent or emergent issues, a gastroenterologist can be reached at any hour by calling [phone].   DIET: Your first meal  following the procedure should be a small meal and then it is ok to progress to your normal diet. Heavy or fried foods are harder to digest and may make you feel nauseous or bloated.  Likewise, meals heavy in dairy and vegetables can increase bloating.  Drink plenty of fluids but you should avoid alcoholic beverages for 24 hours. Try to increase the fiber in your diet.  ACTIVITY:  You should plan to take it easy for the rest of today and you should NOT DRIVE or use heavy machinery until tomorrow (because of the sedation medicines used during the test).    FOLLOW UP: Our staff will call the number listed on your records the next business day following your procedure to check on you and address any questions or concerns that you may have regarding the information given to you following your procedure. If we do not reach you, we will leave a message.  However, if you are feeling well and you are not experiencing any problems, there is no need to return our call.  We will assume that you have returned to your regular daily activities without incident.  Read the handouts given to you by your recovery room nurse.  If any biopsies were taken you will be contacted by phone or by letter within the next 1-3 weeks.  Please call us at [phone] if you have not heard about the biopsies in 3 weeks.    SIGNATURES/CONFIDENTIALITY: You and/or  your care partner have signed paperwork which will be entered into your electronic medical record.  These signatures attest to the fact that that the information above on your After Visit Summary has been reviewed and is understood.  Full responsibility of the confidentiality of this discharge information lies with you and/or your care-partner.

## 2014-05-24 ENCOUNTER — Telehealth: Payer: Self-pay

## 2014-05-24 NOTE — Telephone Encounter (Signed)
No answer, left message

## 2014-05-31 ENCOUNTER — Encounter: Payer: Self-pay | Admitting: Internal Medicine

## 2014-05-31 NOTE — Progress Notes (Signed)
Quick Note:  Prolapse polyp - not pre-cancerous Repeat colonoscopy 2026 ______

## 2014-12-21 ENCOUNTER — Other Ambulatory Visit: Payer: Self-pay

## 2014-12-21 ENCOUNTER — Telehealth: Payer: Self-pay | Admitting: Internal Medicine

## 2014-12-21 DIAGNOSIS — Z1231 Encounter for screening mammogram for malignant neoplasm of breast: Secondary | ICD-10-CM

## 2014-12-21 NOTE — Telephone Encounter (Signed)
Pt said she would like to have her physical this year can we create something for her.

## 2014-12-22 NOTE — Telephone Encounter (Signed)
Sure  Leave sdas on a given day

## 2014-12-25 NOTE — Telephone Encounter (Signed)
Pt scheduled  

## 2015-01-09 ENCOUNTER — Ambulatory Visit
Admission: RE | Admit: 2015-01-09 | Discharge: 2015-01-09 | Disposition: A | Discharge status: Home / Self Care | Payer: BLUE CROSS/BLUE SHIELD | Source: Ambulatory Visit

## 2015-01-09 DIAGNOSIS — Z1231 Encounter for screening mammogram for malignant neoplasm of breast: Secondary | ICD-10-CM

## 2015-01-23 ENCOUNTER — Other Ambulatory Visit (INDEPENDENT_AMBULATORY_CARE_PROVIDER_SITE_OTHER): Payer: BLUE CROSS/BLUE SHIELD

## 2015-01-23 DIAGNOSIS — Z0000 Encounter for general adult medical examination without abnormal findings: Secondary | ICD-10-CM | POA: Diagnosis not present

## 2015-01-23 LAB — CBC WITH DIFFERENTIAL/PLATELET
BASOS PCT: 0.5 % (ref 0.0–3.0)
Basophils Absolute: 0 10*3/uL (ref 0.0–0.1)
EOS ABS: 0.2 10*3/uL (ref 0.0–0.7)
EOS PCT: 3.1 % (ref 0.0–5.0)
HCT: 39.7 % (ref 36.0–46.0)
HEMOGLOBIN: 13.2 g/dL (ref 12.0–15.0)
LYMPHS ABS: 1.8 10*3/uL (ref 0.7–4.0)
Lymphocytes Relative: 28.2 % (ref 12.0–46.0)
MCHC: 33.2 g/dL (ref 30.0–36.0)
MCV: 85.9 fl (ref 78.0–100.0)
MONO ABS: 0.5 10*3/uL (ref 0.1–1.0)
Monocytes Relative: 8.3 % (ref 3.0–12.0)
NEUTROS ABS: 3.8 10*3/uL (ref 1.4–7.7)
Neutrophils Relative %: 59.9 % (ref 43.0–77.0)
PLATELETS: 293 10*3/uL (ref 150.0–400.0)
RBC: 4.62 Mil/uL (ref 3.87–5.11)
RDW: 14 % (ref 11.5–15.5)
WBC: 6.3 10*3/uL (ref 4.0–10.5)

## 2015-01-23 LAB — LIPID PANEL
CHOLESTEROL: 148 mg/dL (ref 0–200)
HDL: 47.4 mg/dL (ref >39.00)
LDL CALC: 86 mg/dL (ref 0–99)
NonHDL: 100.91
TRIGLYCERIDES: 74 mg/dL (ref 0.0–149.0)
Total CHOL/HDL Ratio: 3
VLDL: 14.8 mg/dL (ref 0.0–40.0)

## 2015-01-23 LAB — HEPATIC FUNCTION PANEL
ALT: 27 U/L (ref 0–35)
AST: 23 U/L (ref 0–37)
Albumin: 4.3 g/dL (ref 3.5–5.2)
Alkaline Phosphatase: 85 U/L (ref 39–117)
BILIRUBIN TOTAL: 0.6 mg/dL (ref 0.2–1.2)
Bilirubin, Direct: 0.1 mg/dL (ref 0.0–0.3)
Total Protein: 6.7 g/dL (ref 6.0–8.3)

## 2015-01-23 LAB — BASIC METABOLIC PANEL
BUN: 12 mg/dL (ref 6–23)
CHLORIDE: 108 meq/L (ref 96–112)
CO2: 27 meq/L (ref 19–32)
CREATININE: 0.62 mg/dL (ref 0.40–1.20)
Calcium: 9.9 mg/dL (ref 8.4–10.5)
GFR: 105.17 mL/min (ref >60.00)
GLUCOSE: 94 mg/dL (ref 70–99)
POTASSIUM: 4.5 meq/L (ref 3.5–5.1)
Sodium: 142 mEq/L (ref 135–145)

## 2015-01-23 LAB — TSH: TSH: 1.15 u[IU]/mL (ref 0.35–4.50)

## 2015-01-29 ENCOUNTER — Encounter: Payer: Self-pay | Admitting: Internal Medicine

## 2015-01-29 ENCOUNTER — Ambulatory Visit (INDEPENDENT_AMBULATORY_CARE_PROVIDER_SITE_OTHER): Payer: BLUE CROSS/BLUE SHIELD | Admitting: Internal Medicine

## 2015-01-29 VITALS — BP 160/86 | Temp 98.2°F | Ht 66.25 in | Wt 153.1 lb

## 2015-01-29 DIAGNOSIS — Z853 Personal history of malignant neoplasm of breast: Secondary | ICD-10-CM

## 2015-01-29 DIAGNOSIS — R030 Elevated blood-pressure reading, without diagnosis of hypertension: Secondary | ICD-10-CM

## 2015-01-29 DIAGNOSIS — Z0000 Encounter for general adult medical examination without abnormal findings: Secondary | ICD-10-CM

## 2015-01-29 DIAGNOSIS — IMO0001 Reserved for inherently not codable concepts without codable children: Secondary | ICD-10-CM

## 2015-01-29 DIAGNOSIS — R010 Benign and innocent cardiac murmurs: Secondary | ICD-10-CM

## 2015-01-29 DIAGNOSIS — R011 Cardiac murmur, unspecified: Secondary | ICD-10-CM

## 2015-01-29 NOTE — Patient Instructions (Signed)
Check BP   readings at home   For a week to assess readings  Goal below 140/90   Your exam shows a much louder murmur   Than in past .  Usually related to a valve  problem .  Need echo test of heart and cardiology visit  For evaluation.   Continue lifestyle intervention healthy eating and exercise .     Health Maintenance, Female Adopting a healthy lifestyle and getting preventive care can go a long way to promote health and wellness. Talk with your health care provider about what schedule of regular examinations is right for you. This is a good chance for you to check in with your provider about disease prevention and staying healthy. In between checkups, there are plenty of things you can do on your own. Experts have done a lot of research about which lifestyle changes and preventive measures are most likely to keep you healthy. Ask your health care provider for more information. WEIGHT AND DIET  Eat a healthy diet  Be sure to include plenty of vegetables, fruits, low-fat dairy products, and lean protein.  Do not eat a lot of foods high in solid fats, added sugars, or salt.  Get regular exercise. This is one of the most important things you can do for your health.  Most adults should exercise for at least 150 minutes each week. The exercise should increase your heart rate and make you sweat (moderate-intensity exercise).  Most adults should also do strengthening exercises at least twice a week. This is in addition to the moderate-intensity exercise.  Maintain a healthy weight  Body mass index (BMI) is a measurement that can be used to identify possible weight problems. It estimates body fat based on height and weight. Your health care provider can help determine your BMI and help you achieve or maintain a healthy weight.  For females 67 years of age and older:   A BMI below 18.5 is considered underweight.  A BMI of 18.5 to 24.9 is normal.  A BMI of 25 to 29.9 is considered  overweight.  A BMI of 30 and above is considered obese.  Watch levels of cholesterol and blood lipids  You should start having your blood tested for lipids and cholesterol at 57 years of age, then have this test every 5 years.  You may need to have your cholesterol levels checked more often if:  Your lipid or cholesterol levels are high.  You are older than 57 years of age.  You are at high risk for heart disease.  CANCER SCREENING   Lung Cancer  Lung cancer screening is recommended for adults 37-83 years old who are at high risk for lung cancer because of a history of smoking.  A yearly low-dose CT scan of the lungs is recommended for people who:  Currently smoke.  Have quit within the past 15 years.  Have at least a 30-pack-year history of smoking. A pack year is smoking an average of one pack of cigarettes a day for 1 year.  Yearly screening should continue until it has been 15 years since you quit.  Yearly screening should stop if you develop a health problem that would prevent you from having lung cancer treatment.  Breast Cancer  Practice breast self-awareness. This means understanding how your breasts normally appear and feel.  It also means doing regular breast self-exams. Let your health care provider know about any changes, no matter how small.  If you are in your  6s or 30s, you should have a clinical breast exam (CBE) by a health care provider every 1-3 years as part of a regular health exam.  If you are 67 or older, have a CBE every year. Also consider having a breast X-ray (mammogram) every year.  If you have a family history of breast cancer, talk to your health care provider about genetic screening.  If you are at high risk for breast cancer, talk to your health care provider about having an MRI and a mammogram every year.  Breast cancer gene (BRCA) assessment is recommended for women who have family members with BRCA-related cancers. BRCA-related  cancers include:  Breast.  Ovarian.  Tubal.  Peritoneal cancers.  Results of the assessment will determine the need for genetic counseling and BRCA1 and BRCA2 testing. Cervical Cancer Your health care provider may recommend that you be screened regularly for cancer of the pelvic organs (ovaries, uterus, and vagina). This screening involves a pelvic examination, including checking for microscopic changes to the surface of your cervix (Pap test). You may be encouraged to have this screening done every 3 years, beginning at age 63.  For women ages 66-65, health care providers may recommend pelvic exams and Pap testing every 3 years, or they may recommend the Pap and pelvic exam, combined with testing for human papilloma virus (HPV), every 5 years. Some types of HPV increase your risk of cervical cancer. Testing for HPV may also be done on women of any age with unclear Pap test results.  Other health care providers may not recommend any screening for nonpregnant women who are considered low risk for pelvic cancer and who do not have symptoms. Ask your health care provider if a screening pelvic exam is right for you.  If you have had past treatment for cervical cancer or a condition that could lead to cancer, you need Pap tests and screening for cancer for at least 20 years after your treatment. If Pap tests have been discontinued, your risk factors (such as having a new sexual partner) need to be reassessed to determine if screening should resume. Some women have medical problems that increase the chance of getting cervical cancer. In these cases, your health care provider may recommend more frequent screening and Pap tests. Colorectal Cancer  This type of cancer can be detected and often prevented.  Routine colorectal cancer screening usually begins at 57 years of age and continues through 57 years of age.  Your health care provider may recommend screening at an earlier age if you have risk  factors for colon cancer.  Your health care provider may also recommend using home test kits to check for hidden blood in the stool.  A small camera at the end of a tube can be used to examine your colon directly (sigmoidoscopy or colonoscopy). This is done to check for the earliest forms of colorectal cancer.  Routine screening usually begins at age 37.  Direct examination of the colon should be repeated every 5-10 years through 57 years of age. However, you may need to be screened more often if early forms of precancerous polyps or small growths are found. Skin Cancer  Check your skin from head to toe regularly.  Tell your health care provider about any new moles or changes in moles, especially if there is a change in a mole's shape or color.  Also tell your health care provider if you have a mole that is larger than the size of a pencil eraser.  Always use sunscreen. Apply sunscreen liberally and repeatedly throughout the day.  Protect yourself by wearing long sleeves, pants, a wide-brimmed hat, and sunglasses whenever you are outside. HEART DISEASE, DIABETES, AND HIGH BLOOD PRESSURE   High blood pressure causes heart disease and increases the risk of stroke. High blood pressure is more likely to develop in:  People who have blood pressure in the high end of the normal range (130-139/85-89 mm Hg).  People who are overweight or obese.  People who are African American.  If you are 13-40 years of age, have your blood pressure checked every 3-5 years. If you are 37 years of age or older, have your blood pressure checked every year. You should have your blood pressure measured twice--once when you are at a hospital or clinic, and once when you are not at a hospital or clinic. Record the average of the two measurements. To check your blood pressure when you are not at a hospital or clinic, you can use:  An automated blood pressure machine at a pharmacy.  A home blood pressure  monitor.  If you are between 39 years and 80 years old, ask your health care provider if you should take aspirin to prevent strokes.  Have regular diabetes screenings. This involves taking a blood sample to check your fasting blood sugar level.  If you are at a normal weight and have a low risk for diabetes, have this test once every three years after 57 years of age.  If you are overweight and have a high risk for diabetes, consider being tested at a younger age or more often. PREVENTING INFECTION  Hepatitis B  If you have a higher risk for hepatitis B, you should be screened for this virus. You are considered at high risk for hepatitis B if:  You were born in a country where hepatitis B is common. Ask your health care provider which countries are considered high risk.  Your parents were born in a high-risk country, and you have not been immunized against hepatitis B (hepatitis B vaccine).  You have HIV or AIDS.  You use needles to inject street drugs.  You live with someone who has hepatitis B.  You have had sex with someone who has hepatitis B.  You get hemodialysis treatment.  You take certain medicines for conditions, including cancer, organ transplantation, and autoimmune conditions. Hepatitis C  Blood testing is recommended for:  Everyone born from [birth year] through 1963.  Anyone with known risk factors for hepatitis C. Sexually transmitted infections (STIs)  You should be screened for sexually transmitted infections (STIs) including gonorrhea and chlamydia if:  You are sexually active and are younger than 57 years of age.  You are older than 57 years of age and your health care provider tells you that you are at risk for this type of infection.  Your sexual activity has changed since you were last screened and you are at an increased risk for chlamydia or gonorrhea. Ask your health care provider if you are at risk.  If you do not have HIV, but are at risk, it may be  recommended that you take a prescription medicine daily to prevent HIV infection. This is called pre-exposure prophylaxis (PrEP). You are considered at risk if:  You are sexually active and do not regularly use condoms or know the HIV status of your partner(s).  You take drugs by injection.  You are sexually active with a partner who has HIV. Talk with your health  care provider about whether you are at high risk of being infected with HIV. If you choose to begin PrEP, you should first be tested for HIV. You should then be tested every 3 months for as long as you are taking PrEP.  PREGNANCY   If you are premenopausal and you may become pregnant, ask your health care provider about preconception counseling.  If you may become pregnant, take 400 to 800 micrograms (mcg) of folic acid every day.  If you want to prevent pregnancy, talk to your health care provider about birth control (contraception). OSTEOPOROSIS AND MENOPAUSE   Osteoporosis is a disease in which the bones lose minerals and strength with aging. This can result in serious bone fractures. Your risk for osteoporosis can be identified using a bone density scan.  If you are 21 years of age or older, or if you are at risk for osteoporosis and fractures, ask your health care provider if you should be screened.  Ask your health care provider whether you should take a calcium or vitamin D supplement to lower your risk for osteoporosis.  Menopause may have certain physical symptoms and risks.  Hormone replacement therapy may reduce some of these symptoms and risks. Talk to your health care provider about whether hormone replacement therapy is right for you.  HOME CARE INSTRUCTIONS   Schedule regular health, dental, and eye exams.  Stay current with your immunizations.   Do not use any tobacco products including cigarettes, chewing tobacco, or electronic cigarettes.  If you are pregnant, do not drink alcohol.  If you are  breastfeeding, limit how much and how often you drink alcohol.  Limit alcohol intake to no more than 1 drink per day for nonpregnant women. One drink equals 12 ounces of beer, 5 ounces of wine, or 1 ounces of hard liquor.  Do not use street drugs.  Do not share needles.  Ask your health care provider for help if you need support or information about quitting drugs.  Tell your health care provider if you often feel depressed.  Tell your health care provider if you have ever been abused or do not feel safe at home.   This information is not intended to replace advice given to you by your health care provider. Make sure you discuss any questions you have with your health care provider.   Document Released: 09/09/2010 Document Revised: 03/17/2014 Document Reviewed: 01/26/2013 Elsevier Interactive Patient Education 2016 [place] A heart murmur is an extra sound heard by your health care provider when listening to your heart with a device called a stethoscope. The sound comes from turbulence when blood flows through the heart and may be a "hum" or "whoosh" sound heard when the heart beats. There are two types of heart murmurs:  Innocent murmurs. Most people with this type of heart murmur do not have a heart problem. Many children have innocent heart murmurs. Your health care provider may suggest some basic testing to know whether your murmur is an innocent murmur. If an innocent heart murmur is found, there is no need for further tests or treatment and no need to restrict activities or stop playing sports.  Abnormal murmurs. These types of murmurs can occur in children and adults. In children, abnormal heart murmurs are typically caused from heart defects that are present at birth (congenital). In adults, abnormal murmurs are usually from heart valve problems caused by disease, infection, or aging. CAUSES  Normally, these valves open to  let blood flow through or out of your  heart and then shut to keep it from flowing backward. If they do not work properly, you could have:  Regurgitation--When blood leaks back through the valve in the wrong direction.  Mitral valve prolapse--When the mitral valve of the heart has a loose flap and does not close tightly.  Stenosis--When the valve does not open enough and blocks blood flow. SIGNS AND SYMPTOMS  Innocent murmurs do not cause symptoms, and many people with abnormal murmurs may or may not have symptoms. If symptoms do develop, they may include:  Shortness of breath.  Blue coloring of the skin, especially on the fingertips.  Chest pain.  Palpitations, or feeling a fluttering or skipped heartbeat.  Fainting.  Persistent cough.  Getting tired much faster than expected. DIAGNOSIS  A heart murmur might be heard during a sports physical or during any type of examination. When a murmur is heard, it may suggest a possible problem. When this happens, your health care provider may ask you to see a heart specialist (cardiologist). You may also be asked to have one or more heart tests. In these cases, testing may vary depending on what your health care provider heard. Tests for a heart murmur may include:  Electrocardiogram.  Echocardiogram.  MRI. For children and adults who have an abnormal heart murmur and want to play sports, it is important to complete testing, review test results, and receive recommendations from your health care provider. If heart disease is present, it may not be safe to play. TREATMENT  Innocent murmurs require no treatment or activity restriction. If an abnormal murmur represents a problem with the heart, treatment will depend on the exact nature of the problem. In these cases, medicine or surgery may be needed to treat the problem. HOME CARE INSTRUCTIONS If you want to participate in sports or other types of strenuous physical activity, it is important to discuss this first with your health  care provider. If the murmur represents a problem with the heart and you choose to participate in sports, there is a small chance that a serious problem (including sudden death) could result.  SEEK MEDICAL CARE IF:   You feel that your symptoms are slowly worsening.  You develop any new symptoms that cause concern.  You feel that you are having side effects from any medicines prescribed. SEEK IMMEDIATE MEDICAL CARE IF:   You develop chest pain.  You have shortness of breath.  You notice that your heart beats irregularly often enough to cause you to worry.  You have fainting spells.  Your symptoms suddenly get worse.   This information is not intended to replace advice given to you by your health care provider. Make sure you discuss any questions you have with your health care provider.   Document Released: 04/03/2004 Document Revised: 03/17/2014 Document Reviewed: 11/01/2012 Elsevier Interactive Patient Education [employer].

## 2015-01-29 NOTE — Assessment & Plan Note (Addendum)
Much louder  Pansystolic and blowing  Radiating No thrills  No sx  apprently can walk 2.5 miles without  sx  Never got  The echo test from 2 years ago  .  Need echo test and cardiology appt .

## 2015-01-29 NOTE — Progress Notes (Signed)
Pre visit review using our clinic review tool, if applicable. No additional management support is needed unless otherwise documented below in the visit note.  Chief Complaint  Patient presents with  . Annual Exam    HPI: Patient  Tracy Chen  59 y.o. comes in today for Preventive Health Care visit   No change in health  Says bp ok at home  In past See last year with nl bp at home  Up in office  .  Health Maintenance  Topic Date Due  . INFLUENZA VACCINE  01/29/2016 (Originally 10/09/2014)  . TETANUS/TDAP  01/29/2016 (Originally 11/13/2014)  . HIV Screening  01/29/2016 (Originally 04/23/1970)  . PAP SMEAR  11/13/2015  . MAMMOGRAM  01/08/2017  . COLONOSCOPY  05/22/2024  . Hepatitis C Screening  Completed   Health Maintenance Review LIFESTYLE:  Exercise:  2.5 miles  4 x per week. Tobacco/ETS:n Alcohol: per day n Sugar beverages:n Sleep:7-8  Drug use: no Bone density: ? Dx osteopenia  Last dexa 2009? -2    ROS:  GEN/ HEENT: No fever, significant weight changes sweats headaches vision problems hearing changes, CV/ PULM; No chest pain shortness of breath cough, syncope,edema  change in exercise tolerance. GI /GU: No adominal pain, vomiting, change in bowel habits. No blood in the stool. No significant GU symptoms. SKIN/HEME: ,no acute skin rashes suspicious lesions or bleeding. No lymphadenopathy, nodules, masses.  NEURO/ PSYCH:  No neurologic signs such as weakness numbness. No depression anxiety. IMM/ Allergy: No unusual infections.  Allergy .   REST of 12 system review negative except as per HPI   Past Medical History  Diagnosis Date  . Allergic rhinitis   . HX: breast cancer     hx of 2002 left er/pr 3.2cm  . Osteoporosis     L5  . History of chickenpox     as a child  . Gallstone   . Breast cancer [facility])     Past Surgical History  Procedure Laterality Date  . Abd [place]      gallstones CT gallstones 1/06  . Parathyroidectomy  11/06    left inferior  . Partial  left mastectomy     . Colonoscopy      Family History  Problem Relation Age of Onset  . Osteoporosis Maternal Aunt   . Colon cancer Maternal Aunt   . Thyroid disease    . Heart murmur Brother     Picked up on exam followed by echo.  . Colon cancer Mother   . Valvular heart disease Father     followed   " minor leaky heart valve brother and father  See     My chart ...  Social History   Social History  . Marital Status: Married    Spouse Name: N/A  . Number of Children: N/A  . Years of Education: N/A   Social History Main Topics  . Smoking status: Never Smoker   . Smokeless tobacco: Never Used  . Alcohol Use: No  . Drug Use: No  . Sexual Activity: Not Asked   Other Topics Concern  . None   Social History Narrative   Regular exercise- some   HH of 3     Pets outside goats and chicken.   1 cat    No ets.   Now working in [employer] office . 40 hours  Stopped second job At [employer]   26+   Neg ets   G2P2   6.5-7 hours  Neg ta caffiene .     No outpatient prescriptions prior to visit.   No facility-administered medications prior to visit.     EXAM:  BP 160/86 mmHg  Temp(Src) 98.2 F (36.8 C) (Oral)  Ht 5' 6.25" (1.683 m)  Wt 153 lb 1.6 oz (69.446 kg)  BMI 24.52 kg/m2  Body mass index is 24.52 kg/(m^2).  Physical Exam: Vital signs reviewed UEK:CMKL is a well-developed well-nourished alert cooperative    who appearsr stated age in no acute distress.  HEENT: normocephalic atraumatic , Eyes: PERRL EOM's full, conjunctiva clear, Nares: paten,t no deformity discharge or tenderness., Ears: no deformity EAC's clear TMs with normal landmarks. Mouth: clear OP, no lesions, edema.  Moist mucous membranes. Dentition in adequate repair. NECK: supple without masses, thyromegaly or bruits. CHEST/PULM:  Clear to auscultation and percussion breath sounds equal no wheeze , rales or rhonchi. No chest wall deformities or tenderness. Breast no nodules felt  Old scar  Left    CV: PMI is nondisplaced, S1 S2 no gallops,  3/6  Late  blowing  And systolic murmur  Heard throughout  Precordium   s1 s2 discerned cannot tell fif  s22 split    No rubs  Or thrills Peripheral pulses are full  Brisk without delay.No JVD .  ABDOMEN: Bowel sounds normal nontender  No guard or rebound, no hepato splenomegal no CVA tenderness. . Extremtities:  No clubbing cyanosis or edema, no acute joint swelling or redness no focal atrophy NEURO:  Oriented x3, cranial nerves 3-12 appear to be intact, no obvious focal weakness,gait within normal limits no abnormal reflexes or asymmetrical SKIN: No acute rashes normal turgor, color, no bruising or petechiae. PSYCH: Oriented, good eye contact, no obvious depression  Mild anxiety, cognition and judgment appear normal. LN: no cervical axillary inguinal adenopathy  Lab Results  Component Value Date   WBC 6.3 01/23/2015   HGB 13.2 01/23/2015   HCT 39.7 01/23/2015   PLT 293.0 01/23/2015   GLUCOSE 94 01/23/2015   CHOL 148 01/23/2015   TRIG 74.0 01/23/2015   HDL 47.40 01/23/2015   LDLCALC 86 01/23/2015   ALT 27 01/23/2015   AST 23 01/23/2015   NA 142 01/23/2015   K 4.5 01/23/2015   CL 108 01/23/2015   CREATININE 0.62 01/23/2015   BUN 12 01/23/2015   CO2 27 01/23/2015   TSH 1.15 01/23/2015   BP Readings from Last 3 Encounters:  01/29/15 160/86  05/23/14 129/90  11/17/13 135/85   EKG sinus rythym 81 dec anterior forces .   ASSESSMENT AND PLAN:  Discussed the following assessment and plan:  Visit for preventive health examination - Plan: EKG 12-Lead  White coat hypertension - ned to agiain documet at goal when not in office  or rx   Heart murmur previously undiagnosed - Plan: EKG 12-Lead, Ambulatory referral to Cardiology, ECHOCARDIOGRAM COMPLETE  HX: breast cancer Review of my last years exam  not did not comment on a murmur at her wellness visit in 2015  but in 2014 it was  heard.   And referred at that time.  For echo but she  didn't follow thought she has fami hx of some valvular problems.   Patient Care Team: Burnis Medin, MD as PCP - General Gatha Mayer, MD (Gastroenterology) Patient Instructions   Check BP   readings at home   For a week to assess readings  Goal below 140/90   Your exam shows a much louder murmur   Than  in past .  Usually related to a valve  problem .  Need echo test of heart and cardiology visit  For evaluation.   Continue lifestyle intervention healthy eating and exercise .     Health Maintenance, Female Adopting a healthy lifestyle and getting preventive care can go a long way to promote health and wellness. Talk with your health care provider about what schedule of regular examinations is right for you. This is a good chance for you to check in with your provider about disease prevention and staying healthy. In between checkups, there are plenty of things you can do on your own. Experts have done a lot of research about which lifestyle changes and preventive measures are most likely to keep you healthy. Ask your health care provider for more information. WEIGHT AND DIET  Eat a healthy diet  Be sure to include plenty of vegetables, fruits, low-fat dairy products, and lean protein.  Do not eat a lot of foods high in solid fats, added sugars, or salt.  Get regular exercise. This is one of the most important things you can do for your health.  Most adults should exercise for at least 150 minutes each week. The exercise should increase your heart rate and make you sweat (moderate-intensity exercise).  Most adults should also do strengthening exercises at least twice a week. This is in addition to the moderate-intensity exercise.  Maintain a healthy weight  Body mass index (BMI) is a measurement that can be used to identify possible weight problems. It estimates body fat based on height and weight. Your health care provider can help determine your BMI and help you achieve or maintain  a healthy weight.  For females 58 years of age and older:   A BMI below 18.5 is considered underweight.  A BMI of 18.5 to 24.9 is normal.  A BMI of 25 to 29.9 is considered overweight.  A BMI of 30 and above is considered obese.  Watch levels of cholesterol and blood lipids  You should start having your blood tested for lipids and cholesterol at 57 years of age, then have this test every 5 years.  You may need to have your cholesterol levels checked more often if:  Your lipid or cholesterol levels are high.  You are older than 57 years of age.  You are at high risk for heart disease.  CANCER SCREENING   Lung Cancer  Lung cancer screening is recommended for adults 86-42 years old who are at high risk for lung cancer because of a history of smoking.  A yearly low-dose CT scan of the lungs is recommended for people who:  Currently smoke.  Have quit within the past 15 years.  Have at least a 30-pack-year history of smoking. A pack year is smoking an average of one pack of cigarettes a day for 1 year.  Yearly screening should continue until it has been 15 years since you quit.  Yearly screening should stop if you develop a health problem that would prevent you from having lung cancer treatment.  Breast Cancer  Practice breast self-awareness. This means understanding how your breasts normally appear and feel.  It also means doing regular breast self-exams. Let your health care provider know about any changes, no matter how small.  If you are in your 20s or 30s, you should have a clinical breast exam (CBE) by a health care provider every 1-3 years as part of a regular health exam.  If you are 40  or older, have a CBE every year. Also consider having a breast X-ray (mammogram) every year.  If you have a family history of breast cancer, talk to your health care provider about genetic screening.  If you are at high risk for breast cancer, talk to your health care provider  about having an MRI and a mammogram every year.  Breast cancer gene (BRCA) assessment is recommended for women who have family members with BRCA-related cancers. BRCA-related cancers include:  Breast.  Ovarian.  Tubal.  Peritoneal cancers.  Results of the assessment will determine the need for genetic counseling and BRCA1 and BRCA2 testing. Cervical Cancer Your health care provider may recommend that you be screened regularly for cancer of the pelvic organs (ovaries, uterus, and vagina). This screening involves a pelvic examination, including checking for microscopic changes to the surface of your cervix (Pap test). You may be encouraged to have this screening done every 3 years, beginning at age 32.  For women ages 63-65, health care providers may recommend pelvic exams and Pap testing every 3 years, or they may recommend the Pap and pelvic exam, combined with testing for human papilloma virus (HPV), every 5 years. Some types of HPV increase your risk of cervical cancer. Testing for HPV may also be done on women of any age with unclear Pap test results.  Other health care providers may not recommend any screening for nonpregnant women who are considered low risk for pelvic cancer and who do not have symptoms. Ask your health care provider if a screening pelvic exam is right for you.  If you have had past treatment for cervical cancer or a condition that could lead to cancer, you need Pap tests and screening for cancer for at least 20 years after your treatment. If Pap tests have been discontinued, your risk factors (such as having a new sexual partner) need to be reassessed to determine if screening should resume. Some women have medical problems that increase the chance of getting cervical cancer. In these cases, your health care provider may recommend more frequent screening and Pap tests. Colorectal Cancer  This type of cancer can be detected and often prevented.  Routine colorectal  cancer screening usually begins at 57 years of age and continues through 57 years of age.  Your health care provider may recommend screening at an earlier age if you have risk factors for colon cancer.  Your health care provider may also recommend using home test kits to check for hidden blood in the stool.  A small camera at the end of a tube can be used to examine your colon directly (sigmoidoscopy or colonoscopy). This is done to check for the earliest forms of colorectal cancer.  Routine screening usually begins at age 76.  Direct examination of the colon should be repeated every 5-10 years through 57 years of age. However, you may need to be screened more often if early forms of precancerous polyps or small growths are found. Skin Cancer  Check your skin from head to toe regularly.  Tell your health care provider about any new moles or changes in moles, especially if there is a change in a mole's shape or color.  Also tell your health care provider if you have a mole that is larger than the size of a pencil eraser.  Always use sunscreen. Apply sunscreen liberally and repeatedly throughout the day.  Protect yourself by wearing long sleeves, pants, a wide-brimmed hat, and sunglasses whenever you are outside. HEART DISEASE,  DIABETES, AND HIGH BLOOD PRESSURE   High blood pressure causes heart disease and increases the risk of stroke. High blood pressure is more likely to develop in:  People who have blood pressure in the high end of the normal range (130-139/85-89 mm Hg).  People who are overweight or obese.  People who are African American.  If you are 13-75 years of age, have your blood pressure checked every 3-5 years. If you are 71 years of age or older, have your blood pressure checked every year. You should have your blood pressure measured twice--once when you are at a hospital or clinic, and once when you are not at a hospital or clinic. Record the average of the two  measurements. To check your blood pressure when you are not at a hospital or clinic, you can use:  An automated blood pressure machine at a pharmacy.  A home blood pressure monitor.  If you are between 67 years and 49 years old, ask your health care provider if you should take aspirin to prevent strokes.  Have regular diabetes screenings. This involves taking a blood sample to check your fasting blood sugar level.  If you are at a normal weight and have a low risk for diabetes, have this test once every three years after 57 years of age.  If you are overweight and have a high risk for diabetes, consider being tested at a younger age or more often. PREVENTING INFECTION  Hepatitis B  If you have a higher risk for hepatitis B, you should be screened for this virus. You are considered at high risk for hepatitis B if:  You were born in a country where hepatitis B is common. Ask your health care provider which countries are considered high risk.  Your parents were born in a high-risk country, and you have not been immunized against hepatitis B (hepatitis B vaccine).  You have HIV or AIDS.  You use needles to inject street drugs.  You live with someone who has hepatitis B.  You have had sex with someone who has hepatitis B.  You get hemodialysis treatment.  You take certain medicines for conditions, including cancer, organ transplantation, and autoimmune conditions. Hepatitis C  Blood testing is recommended for:  Everyone born from [birth year] through 1963.  Anyone with known risk factors for hepatitis C. Sexually transmitted infections (STIs)  You should be screened for sexually transmitted infections (STIs) including gonorrhea and chlamydia if:  You are sexually active and are younger than 57 years of age.  You are older than 57 years of age and your health care provider tells you that you are at risk for this type of infection.  Your sexual activity has changed since you were  last screened and you are at an increased risk for chlamydia or gonorrhea. Ask your health care provider if you are at risk.  If you do not have HIV, but are at risk, it may be recommended that you take a prescription medicine daily to prevent HIV infection. This is called pre-exposure prophylaxis (PrEP). You are considered at risk if:  You are sexually active and do not regularly use condoms or know the HIV status of your partner(s).  You take drugs by injection.  You are sexually active with a partner who has HIV. Talk with your health care provider about whether you are at high risk of being infected with HIV. If you choose to begin PrEP, you should first be tested for HIV. You should then  be tested every 3 months for as long as you are taking PrEP.  PREGNANCY   If you are premenopausal and you may become pregnant, ask your health care provider about preconception counseling.  If you may become pregnant, take 400 to 800 micrograms (mcg) of folic acid every day.  If you want to prevent pregnancy, talk to your health care provider about birth control (contraception). OSTEOPOROSIS AND MENOPAUSE   Osteoporosis is a disease in which the bones lose minerals and strength with aging. This can result in serious bone fractures. Your risk for osteoporosis can be identified using a bone density scan.  If you are 68 years of age or older, or if you are at risk for osteoporosis and fractures, ask your health care provider if you should be screened.  Ask your health care provider whether you should take a calcium or vitamin D supplement to lower your risk for osteoporosis.  Menopause may have certain physical symptoms and risks.  Hormone replacement therapy may reduce some of these symptoms and risks. Talk to your health care provider about whether hormone replacement therapy is right for you.  HOME CARE INSTRUCTIONS   Schedule regular health, dental, and eye exams.  Stay current with your  immunizations.   Do not use any tobacco products including cigarettes, chewing tobacco, or electronic cigarettes.  If you are pregnant, do not drink alcohol.  If you are breastfeeding, limit how much and how often you drink alcohol.  Limit alcohol intake to no more than 1 drink per day for nonpregnant women. One drink equals 12 ounces of beer, 5 ounces of wine, or 1 ounces of hard liquor.  Do not use street drugs.  Do not share needles.  Ask your health care provider for help if you need support or information about quitting drugs.  Tell your health care provider if you often feel depressed.  Tell your health care provider if you have ever been abused or do not feel safe at home.   This information is not intended to replace advice given to you by your health care provider. Make sure you discuss any questions you have with your health care provider.   Document Released: 09/09/2010 Document Revised: 03/17/2014 Document Reviewed: 01/26/2013 Elsevier Interactive Patient Education 2016 [place] A heart murmur is an extra sound heard by your health care provider when listening to your heart with a device called a stethoscope. The sound comes from turbulence when blood flows through the heart and may be a "hum" or "whoosh" sound heard when the heart beats. There are two types of heart murmurs:  Innocent murmurs. Most people with this type of heart murmur do not have a heart problem. Many children have innocent heart murmurs. Your health care provider may suggest some basic testing to know whether your murmur is an innocent murmur. If an innocent heart murmur is found, there is no need for further tests or treatment and no need to restrict activities or stop playing sports.  Abnormal murmurs. These types of murmurs can occur in children and adults. In children, abnormal heart murmurs are typically caused from heart defects that are present at birth (congenital). In  adults, abnormal murmurs are usually from heart valve problems caused by disease, infection, or aging. CAUSES  Normally, these valves open to let blood flow through or out of your heart and then shut to keep it from flowing backward. If they do not work properly, you could have:  Regurgitation--When  blood leaks back through the valve in the wrong direction.  Mitral valve prolapse--When the mitral valve of the heart has a loose flap and does not close tightly.  Stenosis--When the valve does not open enough and blocks blood flow. SIGNS AND SYMPTOMS  Innocent murmurs do not cause symptoms, and many people with abnormal murmurs may or may not have symptoms. If symptoms do develop, they may include:  Shortness of breath.  Blue coloring of the skin, especially on the fingertips.  Chest pain.  Palpitations, or feeling a fluttering or skipped heartbeat.  Fainting.  Persistent cough.  Getting tired much faster than expected. DIAGNOSIS  A heart murmur might be heard during a sports physical or during any type of examination. When a murmur is heard, it may suggest a possible problem. When this happens, your health care provider may ask you to see a heart specialist (cardiologist). You may also be asked to have one or more heart tests. In these cases, testing may vary depending on what your health care provider heard. Tests for a heart murmur may include:  Electrocardiogram.  Echocardiogram.  MRI. For children and adults who have an abnormal heart murmur and want to play sports, it is important to complete testing, review test results, and receive recommendations from your health care provider. If heart disease is present, it may not be safe to play. TREATMENT  Innocent murmurs require no treatment or activity restriction. If an abnormal murmur represents a problem with the heart, treatment will depend on the exact nature of the problem. In these cases, medicine or surgery may be needed to  treat the problem. HOME CARE INSTRUCTIONS If you want to participate in sports or other types of strenuous physical activity, it is important to discuss this first with your health care provider. If the murmur represents a problem with the heart and you choose to participate in sports, there is a small chance that a serious problem (including sudden death) could result.  SEEK MEDICAL CARE IF:   You feel that your symptoms are slowly worsening.  You develop any new symptoms that cause concern.  You feel that you are having side effects from any medicines prescribed. SEEK IMMEDIATE MEDICAL CARE IF:   You develop chest pain.  You have shortness of breath.  You notice that your heart beats irregularly often enough to cause you to worry.  You have fainting spells.  Your symptoms suddenly get worse.   This information is not intended to replace advice given to you by your health care provider. Make sure you discuss any questions you have with your health care provider.   Document Released: 04/03/2004 Document Revised: 03/17/2014 Document Reviewed: 11/01/2012 Elsevier Interactive Patient Education 2016 [place] K. Granvil Djordjevic M.D.

## 2015-01-30 ENCOUNTER — Telehealth (HOSPITAL_COMMUNITY): Payer: Self-pay | Admitting: *Deleted

## 2015-01-31 ENCOUNTER — Encounter: Payer: Self-pay | Admitting: Internal Medicine

## 2015-02-07 ENCOUNTER — Encounter: Payer: Self-pay | Admitting: Internal Medicine

## 2015-02-07 NOTE — Telephone Encounter (Signed)
Does this need to be authorized?

## 2015-02-12 ENCOUNTER — Ambulatory Visit (HOSPITAL_COMMUNITY): Payer: BLUE CROSS/BLUE SHIELD

## 2015-02-21 ENCOUNTER — Other Ambulatory Visit: Payer: Self-pay

## 2015-02-21 ENCOUNTER — Ambulatory Visit (HOSPITAL_COMMUNITY): Payer: BLUE CROSS/BLUE SHIELD | Attending: Cardiology

## 2015-02-21 DIAGNOSIS — R010 Benign and innocent cardiac murmurs: Secondary | ICD-10-CM | POA: Diagnosis not present

## 2015-02-21 DIAGNOSIS — I341 Nonrheumatic mitral (valve) prolapse: Secondary | ICD-10-CM | POA: Insufficient documentation

## 2015-02-21 DIAGNOSIS — I517 Cardiomegaly: Secondary | ICD-10-CM | POA: Diagnosis not present

## 2015-02-21 DIAGNOSIS — R011 Cardiac murmur, unspecified: Secondary | ICD-10-CM | POA: Diagnosis present

## 2015-02-21 DIAGNOSIS — I071 Rheumatic tricuspid insufficiency: Secondary | ICD-10-CM | POA: Insufficient documentation

## 2015-02-21 DIAGNOSIS — Z8679 Personal history of other diseases of the circulatory system: Secondary | ICD-10-CM

## 2015-02-21 DIAGNOSIS — I340 Nonrheumatic mitral (valve) insufficiency: Secondary | ICD-10-CM | POA: Insufficient documentation

## 2015-02-21 HISTORY — DX: Personal history of other diseases of the circulatory system: Z86.79

## 2015-02-28 ENCOUNTER — Telehealth: Payer: Self-pay | Admitting: Internal Medicine

## 2015-02-28 NOTE — Telephone Encounter (Signed)
Left a message for a return call.  See result note. 

## 2015-02-28 NOTE — Telephone Encounter (Signed)
Pt returned your call.  

## 2015-03-02 NOTE — Telephone Encounter (Signed)
Left a message for return call; See result note.  

## 2015-03-06 NOTE — Telephone Encounter (Signed)
Pt notified of results by telephone.  See result note. 

## 2015-03-29 ENCOUNTER — Telehealth: Payer: Self-pay | Admitting: Cardiovascular Disease

## 2015-03-29 ENCOUNTER — Encounter: Payer: Self-pay | Admitting: Cardiovascular Disease

## 2015-03-29 ENCOUNTER — Ambulatory Visit (INDEPENDENT_AMBULATORY_CARE_PROVIDER_SITE_OTHER): Payer: BLUE CROSS/BLUE SHIELD | Admitting: Cardiovascular Disease

## 2015-03-29 ENCOUNTER — Encounter: Payer: Self-pay | Admitting: Nurse Practitioner

## 2015-03-29 VITALS — BP 148/100 | HR 74 | Ht 67.5 in | Wt 157.1 lb

## 2015-03-29 DIAGNOSIS — I340 Nonrheumatic mitral (valve) insufficiency: Secondary | ICD-10-CM

## 2015-03-29 DIAGNOSIS — I341 Nonrheumatic mitral (valve) prolapse: Secondary | ICD-10-CM

## 2015-03-29 LAB — CBC WITH DIFFERENTIAL/PLATELET
BASOS ABS: 0 10*3/uL (ref 0.0–0.1)
Basophils Relative: 0 % (ref 0–1)
Eosinophils Absolute: 0.2 10*3/uL (ref 0.0–0.7)
Eosinophils Relative: 3 % (ref 0–5)
HEMATOCRIT: 38.6 % (ref 36.0–46.0)
HEMOGLOBIN: 13.1 g/dL (ref 12.0–15.0)
LYMPHS ABS: 1.3 10*3/uL (ref 0.7–4.0)
LYMPHS PCT: 21 % (ref 12–46)
MCH: 28.7 pg (ref 26.0–34.0)
MCHC: 33.9 g/dL (ref 30.0–36.0)
MCV: 84.5 fL (ref 78.0–100.0)
MPV: 9.7 fL (ref 8.6–12.4)
Monocytes Absolute: 0.6 10*3/uL (ref 0.1–1.0)
Monocytes Relative: 9 % (ref 3–12)
NEUTROS PCT: 67 % (ref 43–77)
Neutro Abs: 4.2 10*3/uL (ref 1.7–7.7)
Platelets: 305 10*3/uL (ref 150–400)
RBC: 4.57 MIL/uL (ref 3.87–5.11)
RDW: 14 % (ref 11.5–15.5)
WBC: 6.3 10*3/uL (ref 4.0–10.5)

## 2015-03-29 LAB — BASIC METABOLIC PANEL
BUN: 10 mg/dL (ref 7–25)
CHLORIDE: 106 mmol/L (ref 98–110)
CO2: 26 mmol/L (ref 20–31)
Calcium: 9.2 mg/dL (ref 8.6–10.4)
Creat: 0.64 mg/dL (ref 0.50–1.05)
Glucose, Bld: 89 mg/dL (ref 65–99)
POTASSIUM: 3.7 mmol/L (ref 3.5–5.3)
Sodium: 138 mmol/L (ref 135–146)

## 2015-03-29 MED ORDER — POTASSIUM CHLORIDE CRYS ER 20 MEQ PO TBCR: 20.0000 meq | EXTENDED_RELEASE_TABLET | Freq: Once | ORAL | Status: DC

## 2015-03-29 MED ORDER — HYDROCHLOROTHIAZIDE 25 MG PO TABS: 25.0000 mg | ORAL_TABLET | Freq: Every day | ORAL | Status: DC

## 2015-03-29 NOTE — Telephone Encounter (Signed)
Left detailed message on patient's voice mail that I will cancel TEE for 1/26.  I advised that I will call her back next week to see if Dr. Elmarie Shiley next available day for this procedure, Feb. 15, will work for her.

## 2015-03-29 NOTE — Progress Notes (Signed)
Cardiology Office Note   Date:  03/29/2015   ID:  Henreitta Cea, DOB [birth year], MRN [id number]  PCP:  Lottie Dawson, MD  Cardiologist:   Thayer Headings, MD   Chief Complaint  Patient presents with  . Heart Murmur   Problem List 1. Heart murmur  2. Breast cancer . Left breast, s/p chemo and XRT    History of Present Illness: Tracy Chen is a 60 y.o. female who presents for evaluation of a heart murmur. She was recently found have a heart murmur on exam. She had an echo card gram which revealed mitral valve prolapse involving the posterior leaflet. She has associated severe mitral regurgitation.    no hx of murmur in the past.  No CP or dyspnea.   Exercises regularly without problems.  No limitations    Past Medical History  Diagnosis Date  . Allergic rhinitis   . HX: breast cancer     hx of 2002 left er/pr 3.2cm  . Osteoporosis     L5  . History of chickenpox     as a child  . Gallstone   . Breast cancer [facility])     Past Surgical History  Procedure Laterality Date  . Abd [place]      gallstones CT gallstones 1/06  . Parathyroidectomy  11/06    left inferior  . Partial left mastectomy     . Colonoscopy       No current outpatient prescriptions on file.   No current facility-administered medications for this visit.    Allergies:   Penicillins    Social History:  The patient  reports that she has never smoked. She has never used smokeless tobacco. She reports that she does not drink alcohol or use illicit drugs.   Family History:  The patient's family history includes Colon cancer in her maternal aunt and mother; Heart murmur in her brother; Osteoporosis in her maternal aunt; Valvular heart disease in her father.    ROS:  Please see the history of present illness.    Review of Systems: Constitutional:  denies fever, chills, diaphoresis, appetite change and fatigue.  HEENT: denies photophobia, eye pain, redness, hearing loss, ear pain,  congestion, sore throat, rhinorrhea, sneezing, neck pain, neck stiffness and tinnitus.  Respiratory: denies SOB, DOE, cough, chest tightness, and wheezing.  Cardiovascular: denies chest pain, palpitations and leg swelling.  Gastrointestinal: denies nausea, vomiting, abdominal pain, diarrhea, constipation, blood in stool.  Genitourinary: denies dysuria, urgency, frequency, hematuria, flank pain and difficulty urinating.  Musculoskeletal: denies  myalgias, back pain, joint swelling, arthralgias and gait problem.   Skin: denies pallor, rash and wound.  Neurological: denies dizziness, seizures, syncope, weakness, light-headedness, numbness and headaches.   Hematological: denies adenopathy, easy bruising, personal or family bleeding history.  Psychiatric/ Behavioral: denies suicidal ideation, mood changes, confusion, nervousness, sleep disturbance and agitation.       All other systems are reviewed and negative.    PHYSICAL EXAM: VS:  BP 148/100 mmHg  Pulse 74  Ht 5' 7.5" (1.715 m)  Wt 157 lb 1.9 oz (71.269 kg)  BMI 24.23 kg/m2 , BMI Body mass index is 24.23 kg/(m^2). GEN: Well nourished, well developed, in no acute distress HEENT: normal Neck: no JVD, carotid bruits, or masses Cardiac: RRR; 3/6 systolic murmur at the left sternal border with radiatio to the axilla , no rubs, or gallops,no edema  Respiratory:  clear to auscultation bilaterally, normal work of breathing GI: soft, nontender, nondistended, + BS  MS: no deformity or atrophy Skin: warm and dry, no rash Neuro:  Strength and sensation are intact Psych: normal   EKG:  EKG is not ordered today. The ekg ordered 01/29/15  demonstrates  NSR at 81,   Recent Labs: 01/23/2015: ALT 27; BUN 12; Creatinine, Ser 0.62; Hemoglobin 13.2; Platelets 293.0; Potassium 4.5; Sodium 142; TSH 1.15    Lipid Panel    Component Value Date/Time   CHOL 148 01/23/2015 0838   TRIG 74.0 01/23/2015 0838   TRIG 62 12/26/2005 1000   HDL 47.40  01/23/2015 0838   CHOLHDL 3 01/23/2015 0838   CHOLHDL 3.4 CALC 12/26/2005 1000   VLDL 14.8 01/23/2015 0838   LDLCALC 86 01/23/2015 0838      Wt Readings from Last 3 Encounters:  03/29/15 157 lb 1.9 oz (71.269 kg)  01/29/15 153 lb 1.6 oz (69.446 kg)  05/23/14 154 lb (69.854 kg)      Other studies Reviewed: Additional studies/ records that were reviewed today include: . Review of the above records demonstrates:    ASSESSMENT AND PLAN:  1.  MVP, / MR She has prolapse of the posterior leaflet of her mitral valve. She now has severe mitral regurgitation. Fortunately, she is asymptomatic. She is hypertensive and we will start her on HCTZ 25 mg a day and potassium chloride 20 mg a day which should help with the severity of her mitral regurgitation. We will schedule her for a transesophageal echo next week. I'll see her again in 3 months for follow-up visit.  We discussed the fact that she will likely need mitral valve repair sometime in the future. At this point she is asymptomatic and her left atrial size is only mildly - moderately enlarged.    Current medicines are reviewed at length with the patient today.  The patient does not have concerns regarding medicines.  The following changes have been made:  no change  Labs/ tests ordered today include:  No orders of the defined types were placed in this encounter.     Disposition:   FU with me in 3 months      Khriz Liddy, Wonda Cheng, MD  03/29/2015 9:29 AM    [facility] [place], [place], [place]  [address] Phone: [phone]; Fax: [phone]   [facility]  [address] [place], [place]  [address] [phone]   Fax [phone]

## 2015-03-29 NOTE — Patient Instructions (Signed)
Medication Instructions:  START HCTZ (Hydrochlorothiazide) 25 mg once daily START Kdur (potassium supplement) 20 meq once daily   Labwork: TODAY - CBC, Basic metabolic panel  Your physician recommends that you return for lab work in: 3 weeks for basic metabolic panel   Testing/Procedures: Your physician has requested that you have a TEE. During a TEE, sound waves are used to create images of your heart. It provides your doctor with information about the size and shape of your heart and how well your heart's chambers and valves are working. In this test, a transducer is attached to the end of a flexible tube that's guided down your throat and into your esophagus (the tube leading from you mouth to your stomach) to get a more detailed image of your heart. You are not awake for the procedure. Please see the instruction sheet given to you today. For further information please visit [email or web address].   Follow-Up: Your physician recommends that you schedule a follow-up appointment in: 3 months with Dr. Acie Fredrickson.    If you need a refill on your cardiac medications before your next appointment, please call your pharmacy.   Thank you for choosing [facility]! Christen Bame, RN [phone]

## 2015-03-29 NOTE — Telephone Encounter (Signed)
Pt calling back to let you know she can't do 04-05-15-pls call to rs [phone]

## 2015-04-02 ENCOUNTER — Telehealth: Payer: Self-pay | Admitting: *Deleted

## 2015-04-02 NOTE — Telephone Encounter (Signed)
Early called for clarification on potassium rx. It was sent in for the patient to take 20 meq once, and he was questioning should this be once or daily. Per recent office note 03/29/2015 9:59 AM After Visit Summary Printed Emmaline Life, RN      Patient Instructions     Medication Instructions:  START HCTZ (Hydrochlorothiazide) 25 mg once daily START Kdur (potassium supplement) 20 meq once daily    I made pharmacy aware that this should be taken daily.

## 2015-04-03 NOTE — Telephone Encounter (Signed)
Spoke with patient to advise her that TEE has been rescheduled for 2/15.  She is scheduled with Dr. Acie Fredrickson for 0900 on that date. She has appointment for blood work on 2/9 for follow-up bmet since starting HCTZ and kdur.  I advised her I will also order a CBC in preparation for TEE.  She verbalized understanding and agreement.

## 2015-04-19 ENCOUNTER — Other Ambulatory Visit (INDEPENDENT_AMBULATORY_CARE_PROVIDER_SITE_OTHER): Payer: BLUE CROSS/BLUE SHIELD | Admitting: *Deleted

## 2015-04-19 DIAGNOSIS — I341 Nonrheumatic mitral (valve) prolapse: Secondary | ICD-10-CM | POA: Diagnosis not present

## 2015-04-19 DIAGNOSIS — I340 Nonrheumatic mitral (valve) insufficiency: Secondary | ICD-10-CM

## 2015-04-19 LAB — CBC WITH DIFFERENTIAL/PLATELET
Basophils Absolute: 0 10*3/uL (ref 0.0–0.1)
Basophils Relative: 0 % (ref 0–1)
EOS ABS: 0.2 10*3/uL (ref 0.0–0.7)
EOS PCT: 3 % (ref 0–5)
HEMATOCRIT: 38.9 % (ref 36.0–46.0)
Hemoglobin: 13.5 g/dL (ref 12.0–15.0)
LYMPHS ABS: 1.9 10*3/uL (ref 0.7–4.0)
LYMPHS PCT: 30 % (ref 12–46)
MCH: 29.2 pg (ref 26.0–34.0)
MCHC: 34.7 g/dL (ref 30.0–36.0)
MCV: 84.2 fL (ref 78.0–100.0)
MONOS PCT: 11 % (ref 3–12)
MPV: 9.4 fL (ref 8.6–12.4)
Monocytes Absolute: 0.7 10*3/uL (ref 0.1–1.0)
Neutro Abs: 3.6 10*3/uL (ref 1.7–7.7)
Neutrophils Relative %: 56 % (ref 43–77)
PLATELETS: 313 10*3/uL (ref 150–400)
RBC: 4.62 MIL/uL (ref 3.87–5.11)
RDW: 14 % (ref 11.5–15.5)
WBC: 6.4 10*3/uL (ref 4.0–10.5)

## 2015-04-19 LAB — BASIC METABOLIC PANEL
BUN: 14 mg/dL (ref 7–25)
CALCIUM: 9.5 mg/dL (ref 8.6–10.4)
CO2: 26 mmol/L (ref 20–31)
CREATININE: 0.74 mg/dL (ref 0.50–1.05)
Chloride: 102 mmol/L (ref 98–110)
Glucose, Bld: 87 mg/dL (ref 65–99)
Potassium: 4.1 mmol/L (ref 3.5–5.3)
Sodium: 137 mmol/L (ref 135–146)

## 2015-04-24 ENCOUNTER — Other Ambulatory Visit: Payer: BLUE CROSS/BLUE SHIELD

## 2015-04-24 ENCOUNTER — Other Ambulatory Visit: Payer: Self-pay | Admitting: Cardiovascular Disease

## 2015-04-24 DIAGNOSIS — I341 Nonrheumatic mitral (valve) prolapse: Secondary | ICD-10-CM

## 2015-04-25 ENCOUNTER — Encounter (HOSPITAL_COMMUNITY): Payer: Self-pay | Admitting: *Deleted

## 2015-04-25 ENCOUNTER — Ambulatory Visit (HOSPITAL_COMMUNITY)
Admission: RE | Admit: 2015-04-25 | Discharge: 2015-04-25 | Disposition: A | Discharge status: Home / Self Care | Payer: BLUE CROSS/BLUE SHIELD | Source: Ambulatory Visit | Attending: Cardiovascular Disease | Admitting: Cardiovascular Disease

## 2015-04-25 ENCOUNTER — Ambulatory Visit (HOSPITAL_BASED_OUTPATIENT_CLINIC_OR_DEPARTMENT_OTHER): Payer: BLUE CROSS/BLUE SHIELD

## 2015-04-25 ENCOUNTER — Encounter (HOSPITAL_COMMUNITY)
Admission: RE | Disposition: A | Discharge status: Home / Self Care | Payer: Self-pay | Source: Ambulatory Visit | Attending: Cardiovascular Disease

## 2015-04-25 DIAGNOSIS — R011 Cardiac murmur, unspecified: Secondary | ICD-10-CM | POA: Insufficient documentation

## 2015-04-25 DIAGNOSIS — Z9221 Personal history of antineoplastic chemotherapy: Secondary | ICD-10-CM | POA: Insufficient documentation

## 2015-04-25 DIAGNOSIS — Z853 Personal history of malignant neoplasm of breast: Secondary | ICD-10-CM | POA: Diagnosis not present

## 2015-04-25 DIAGNOSIS — Z880 Allergy status to penicillin: Secondary | ICD-10-CM | POA: Insufficient documentation

## 2015-04-25 DIAGNOSIS — I341 Nonrheumatic mitral (valve) prolapse: Secondary | ICD-10-CM | POA: Diagnosis present

## 2015-04-25 DIAGNOSIS — I340 Nonrheumatic mitral (valve) insufficiency: Secondary | ICD-10-CM

## 2015-04-25 HISTORY — PX: TEE WITHOUT CARDIOVERSION: SHX5443

## 2015-04-25 SURGERY — ECHOCARDIOGRAM, TRANSESOPHAGEAL
Anesthesia: Moderate Sedation

## 2015-04-25 MED ORDER — MIDAZOLAM HCL 5 MG/ML IJ SOLN
INTRAMUSCULAR | Status: AC
  Filled 2015-04-25: qty 2

## 2015-04-25 MED ORDER — MIDAZOLAM HCL 10 MG/2ML IJ SOLN
INTRAMUSCULAR | Status: DC | PRN
  Administered 2015-04-25 (×3): 2 mg via INTRAVENOUS
  Administered 2015-04-25: 1 mg via INTRAVENOUS

## 2015-04-25 MED ORDER — BUTAMBEN-TETRACAINE-BENZOCAINE 2-2-14 % EX AERO
INHALATION_SPRAY | CUTANEOUS | Status: DC | PRN
  Administered 2015-04-25: 2 via TOPICAL

## 2015-04-25 MED ORDER — FENTANYL CITRATE (PF) 100 MCG/2ML IJ SOLN
INTRAMUSCULAR | Status: AC
  Filled 2015-04-25: qty 2

## 2015-04-25 MED ORDER — SODIUM CHLORIDE 0.9 % IV SOLN
INTRAVENOUS | Status: DC
  Administered 2015-04-25: 09:00:00 via INTRAVENOUS

## 2015-04-25 MED ORDER — FENTANYL CITRATE (PF) 100 MCG/2ML IJ SOLN
INTRAMUSCULAR | Status: DC | PRN
  Administered 2015-04-25: 25 ug via INTRAVENOUS
  Administered 2015-04-25: 50 ug via INTRAVENOUS
  Administered 2015-04-25: 25 ug via INTRAVENOUS

## 2015-04-25 NOTE — Discharge Instructions (Signed)

## 2015-04-25 NOTE — Interval H&P Note (Signed)
History and Physical Interval Note:  04/25/2015 9:06 AM  Tracy Chen  has presented today for surgery, with the diagnosis of [name]  The various methods of treatment have been discussed with the patient and family. After consideration of risks, benefits and other options for treatment, the patient has consented to  Procedure(s): TRANSESOPHAGEAL ECHOCARDIOGRAM (TEE) (N/A) as a surgical intervention .  The patient's history has been reviewed, patient examined, no change in status, stable for surgery.  I have reviewed the patient's chart and labs.  Questions were answered to the patient's satisfaction.     Jachob Mcclean, Wonda Cheng

## 2015-04-25 NOTE — Progress Notes (Deleted)
  Echocardiogram Echocardiogram Transesophageal has been performed.  Tracy Chen 04/25/2015, 11:30 AM

## 2015-04-25 NOTE — CV Procedure (Addendum)
    Transesophageal Echocardiogram Note  SHAWNTAI GAUR [id number] Jun 17, 1955  Procedure: Transesophageal Echocardiogram Indications: MVP , MR  Procedure Details Consent: Obtained Time Out: Verified patient identification, verified procedure, site/side was marked, verified correct patient position, special equipment/implants available, Radiology Safety Procedures followed,  medications/allergies/relevent history reviewed, required imaging and test results available.  Performed  The patient was under my care for the entire moderate sedation protocol.  ( 9:00 - 9:45)    Medications: Fentanyl: 100 mcg iv  Versed:  7 mg iv   Left Ventrical:  EF ~ 50-55%  Mitral Valve: prolapse , particularly of the posterior leaflet, partially flail with evidence of a ruptured chordae.     Severe MR.    There is evidence of reversal of flow in the pulmonary veins. PISA radius between 1.0 and 1.6.  Aortic Valve: normal   Tricuspid Valve: trace TR  Pulmonic Valve: no PI  Left Atrium/ Left atrial appendage: no thrombi   Atrial septum: no PFO by color duplex   Aorta: minimal athersclerosis    Complications: No apparent complications Patient did tolerate procedure well.     She has severe MR with evidence of a partially flail posterior leaflet. Will ask Dr. Roxy Manns to evaluate for MV repair.   Thayer Headings, Brooke Bonito., MD, [facility] 04/25/2015, 9:31 AM

## 2015-04-25 NOTE — H&P (View-Only) (Signed)
Cardiology Office Note   Date:  03/29/2015   ID:  Henreitta Cea, DOB 1955-09-23, MRN [id number]  PCP:  Lottie Dawson, MD  Cardiologist:   Thayer Headings, MD   Chief Complaint  Patient presents with  . Heart Murmur   Problem List 1. Heart murmur  2. Breast cancer . Left breast, s/p chemo and XRT    History of Present Illness: Tracy Chen is a 60 y.o. female who presents for evaluation of a heart murmur. She was recently found have a heart murmur on exam. She had an echo card gram which revealed mitral valve prolapse involving the posterior leaflet. She has associated severe mitral regurgitation.    no hx of murmur in the past.  No CP or dyspnea.   Exercises regularly without problems.  No limitations    Past Medical History  Diagnosis Date  . Allergic rhinitis   . HX: breast cancer     hx of 2002 left er/pr 3.2cm  . Osteoporosis     L5  . History of chickenpox     as a child  . Gallstone   . Breast cancer [facility])     Past Surgical History  Procedure Laterality Date  . Abd [place]      gallstones CT gallstones 1/06  . Parathyroidectomy  11/06    left inferior  . Partial left mastectomy     . Colonoscopy       No current outpatient prescriptions on file.   No current facility-administered medications for this visit.    Allergies:   Penicillins    Social History:  The patient  reports that she has never smoked. She has never used smokeless tobacco. She reports that she does not drink alcohol or use illicit drugs.   Family History:  The patient's family history includes Colon cancer in her maternal aunt and mother; Heart murmur in her brother; Osteoporosis in her maternal aunt; Valvular heart disease in her father.    ROS:  Please see the history of present illness.    Review of Systems: Constitutional:  denies fever, chills, diaphoresis, appetite change and fatigue.  HEENT: denies photophobia, eye pain, redness, hearing loss, ear pain,  congestion, sore throat, rhinorrhea, sneezing, neck pain, neck stiffness and tinnitus.  Respiratory: denies SOB, DOE, cough, chest tightness, and wheezing.  Cardiovascular: denies chest pain, palpitations and leg swelling.  Gastrointestinal: denies nausea, vomiting, abdominal pain, diarrhea, constipation, blood in stool.  Genitourinary: denies dysuria, urgency, frequency, hematuria, flank pain and difficulty urinating.  Musculoskeletal: denies  myalgias, back pain, joint swelling, arthralgias and gait problem.   Skin: denies pallor, rash and wound.  Neurological: denies dizziness, seizures, syncope, weakness, light-headedness, numbness and headaches.   Hematological: denies adenopathy, easy bruising, personal or family bleeding history.  Psychiatric/ Behavioral: denies suicidal ideation, mood changes, confusion, nervousness, sleep disturbance and agitation.       All other systems are reviewed and negative.    PHYSICAL EXAM: VS:  BP 148/100 mmHg  Pulse 74  Ht 5' 7.5" (1.715 m)  Wt 157 lb 1.9 oz (71.269 kg)  BMI 24.23 kg/m2 , BMI Body mass index is 24.23 kg/(m^2). GEN: Well nourished, well developed, in no acute distress HEENT: normal Neck: no JVD, carotid bruits, or masses Cardiac: RRR; 3/6 systolic murmur at the left sternal border with radiatio to the axilla , no rubs, or gallops,no edema  Respiratory:  clear to auscultation bilaterally, normal work of breathing GI: soft, nontender, nondistended, + BS  MS: no deformity or atrophy Skin: warm and dry, no rash Neuro:  Strength and sensation are intact Psych: normal   EKG:  EKG is not ordered today. The ekg ordered 01/29/15  demonstrates  NSR at 81,   Recent Labs: 01/23/2015: ALT 27; BUN 12; Creatinine, Ser 0.62; Hemoglobin 13.2; Platelets 293.0; Potassium 4.5; Sodium 142; TSH 1.15    Lipid Panel    Component Value Date/Time   CHOL 148 01/23/2015 0838   TRIG 74.0 01/23/2015 0838   TRIG 62 12/26/2005 1000   HDL 47.40  01/23/2015 0838   CHOLHDL 3 01/23/2015 0838   CHOLHDL 3.4 CALC 12/26/2005 1000   VLDL 14.8 01/23/2015 0838   LDLCALC 86 01/23/2015 0838      Wt Readings from Last 3 Encounters:  03/29/15 157 lb 1.9 oz (71.269 kg)  01/29/15 153 lb 1.6 oz (69.446 kg)  05/23/14 154 lb (69.854 kg)      Other studies Reviewed: Additional studies/ records that were reviewed today include: . Review of the above records demonstrates:    ASSESSMENT AND PLAN:  1.  MVP, / MR She has prolapse of the posterior leaflet of her mitral valve. She now has severe mitral regurgitation. Fortunately, she is asymptomatic. She is hypertensive and we will start her on HCTZ 25 mg a day and potassium chloride 20 mg a day which should help with the severity of her mitral regurgitation. We will schedule her for a transesophageal echo next week. I'll see her again in 3 months for follow-up visit.  We discussed the fact that she will likely need mitral valve repair sometime in the future. At this point she is asymptomatic and her left atrial size is only mildly - moderately enlarged.    Current medicines are reviewed at length with the patient today.  The patient does not have concerns regarding medicines.  The following changes have been made:  no change  Labs/ tests ordered today include:  No orders of the defined types were placed in this encounter.     Disposition:   FU with me in 3 months      Nahser, Wonda Cheng, MD  03/29/2015 9:29 AM    [facility] [place], [place], [place]  [address] Phone: [phone]; Fax: [phone]   [facility]  [address] [place], [place]  [address] [phone]   Fax [phone]

## 2015-04-25 NOTE — Progress Notes (Signed)
  Echocardiogram Echocardiogram Transesophageal has been performed.  Tracy Chen 04/25/2015, 1:28 PM

## 2015-04-26 ENCOUNTER — Encounter (HOSPITAL_COMMUNITY): Payer: Self-pay | Admitting: Cardiovascular Disease

## 2015-04-26 ENCOUNTER — Telehealth: Payer: Self-pay | Admitting: Cardiovascular Disease

## 2015-04-26 DIAGNOSIS — I340 Nonrheumatic mitral (valve) insufficiency: Secondary | ICD-10-CM

## 2015-04-26 NOTE — Telephone Encounter (Signed)
New Message:  Pt is calling you back to get a procedure scheduled. Please call back

## 2015-04-26 NOTE — Telephone Encounter (Signed)
Spoke with patient who states she would like to set up cath for the 2nd or 3rd week in March.  She will talk to her husband about a good date for both of them to be off work and call me back to schedule.

## 2015-04-26 NOTE — Telephone Encounter (Signed)
Left message for patient to call back to schedule cath

## 2015-04-30 ENCOUNTER — Encounter: Payer: BLUE CROSS/BLUE SHIELD | Admitting: Thoracic Surgery (Cardiothoracic Vascular Surgery)

## 2015-05-01 NOTE — Telephone Encounter (Signed)
Follow Up   Pt called to discuss scheduling the CATH please call back to discuss.

## 2015-05-02 ENCOUNTER — Encounter: Payer: Self-pay | Admitting: Nurse Practitioner

## 2015-05-02 NOTE — Telephone Encounter (Signed)
Spoke with patient and advised that I have scheduled her for left heart cath on March 21 with Dr. [place].  I reviewed pre-cath instructions with her and advised that I am placing a copy in the mail.  She is scheduled for lab on March 14.  I advised her to call back with questions or concerns prior to procedure.  She verbalized understanding and agreement and thanked me for the call.

## 2015-05-02 NOTE — Telephone Encounter (Signed)
Spoke with patient who states she would like to go ahead and schedule cardiac cath for March 21.  I advised her that I will call to schedule and call her back with details.  She thanked me for the call.

## 2015-05-02 NOTE — Telephone Encounter (Signed)
Follow Up ° °Pt returned call//  °

## 2015-05-07 ENCOUNTER — Other Ambulatory Visit: Payer: Self-pay | Admitting: *Deleted

## 2015-05-07 ENCOUNTER — Encounter: Payer: Self-pay | Admitting: Thoracic Surgery (Cardiothoracic Vascular Surgery)

## 2015-05-07 ENCOUNTER — Institutional Professional Consult (permissible substitution) (INDEPENDENT_AMBULATORY_CARE_PROVIDER_SITE_OTHER): Payer: BLUE CROSS/BLUE SHIELD | Admitting: Thoracic Surgery (Cardiothoracic Vascular Surgery)

## 2015-05-07 VITALS — BP 154/84 | HR 79 | Resp 16 | Ht 67.5 in | Wt 157.0 lb

## 2015-05-07 DIAGNOSIS — I340 Nonrheumatic mitral (valve) insufficiency: Secondary | ICD-10-CM

## 2015-05-07 DIAGNOSIS — I7409 Other arterial embolism and thrombosis of abdominal aorta: Secondary | ICD-10-CM

## 2015-05-07 DIAGNOSIS — I341 Nonrheumatic mitral (valve) prolapse: Secondary | ICD-10-CM

## 2015-05-07 DIAGNOSIS — I712 Thoracic aortic aneurysm, without rupture, unspecified: Secondary | ICD-10-CM

## 2015-05-07 NOTE — Patient Instructions (Signed)
Continue all previous medications without any changes at this time  

## 2015-05-07 NOTE — Progress Notes (Signed)
[address]       [place] [address]             [phone]     CARDIOTHORACIC SURGERY CONSULTATION REPORT  Referring Provider is Nahser, Wonda Cheng, MD PCP is Lottie Dawson, MD  Chief Complaint  Patient presents with  . Mitral Regurgitation    Surgical eval, TEE 04/25/15,Cardiac Cath scheduled for 05/29/15    HPI:  Patient is a 60 year old female with no previous cardiac history who was recently found to have a prominent systolic murmur on routine physical examination by her primary care physician. A transthoracic echocardiogram was performed demonstrating the presence of mitral valve prolapse with severe mitral regurgitation and normal left ventricular systolic function. The patient was referred to Dr. Acie Fredrickson for consultation and underwent transesophageal echocardiogram on 04/25/2015. This confirmed the presence of mitral valve prolapse with a flail segment of the posterior leaflet and severe mitral regurgitation. Left ventricular systolic function remains preserved. The patient was referred for surgical consultation.  The patient is married and lives locally in [place] with her husband. 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. She is originally from [facility] [place] and relocated to [facility] several years ago. She has been physically active and healthy for her entire adult life with exception of the development of breast cancer for which she was treated nearly 15 years ago. She remains physically active and states that she exercises on a regular basis. She does not push her self terribly hard when she exercises, but she specifically denies any symptoms of exertional shortness of breath or chest discomfort. She has not noticed any significant change in her exercise tolerance in recent years. She denies any history of resting shortness of breath, PND, orthopnea, palpitations, dizzy spells, or syncope.   Past Medical History  Diagnosis  Date  . Allergic rhinitis   . HX: breast cancer     hx of 2002 left er/pr 3.2cm  . Osteoporosis     L5  . History of chickenpox     as a child  . Gallstone   . Breast cancer ([place])   . 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 Mitral valve prolapse 02/21/2015  . Severe mitral regurgitation 02/21/2015    Past Surgical History  Procedure Laterality Date  . Abd [place]      gallstones CT gallstones 1/06  . Parathyroidectomy  11/06    left inferior  . Partial left mastectomy     . Colonoscopy    . Tee without cardioversion N/A 04/25/2015    Procedure: TRANSESOPHAGEAL ECHOCARDIOGRAM (TEE);  Surgeon: Thayer Headings, MD;  Location: [facility] ENDOSCOPY;  Service: Cardiovascular;  Laterality: N/A;    Family History  Problem Relation Age of Onset  . Osteoporosis Maternal Aunt   . Colon cancer Maternal Aunt   . Thyroid disease    . Heart murmur Brother     Picked up on exam followed by echo.  . Colon cancer Mother   . Valvular heart disease Father     followed     Social History   Social History  . Marital Status: Married    Spouse Name: N/A  . Number of Children: N/A  . Years of Education: N/A   Occupational History  . Not on file.   Social History Main Topics  . Smoking status: Never Smoker   . Smokeless tobacco: Never Used  . Alcohol Use: No  .  Drug Use: No  . Sexual Activity: Not on file   Other Topics Concern  . Not on file   Social History Narrative   Regular exercise- some   HH of 3     Pets outside goats and chicken.   1 cat    No ets.   Now working in [employer] office . 40 hours  Stopped second job At [employer]   26+   Neg ets   G2P2   6.5-7 hours    Neg ta caffiene .     Current Outpatient Prescriptions  Medication Sig Dispense Refill  . hydrochlorothiazide (HYDRODIURIL) 25 MG tablet Take 1 tablet (25 mg total) by mouth daily. 30 tablet 11  . potassium chloride SA (K-DUR,KLOR-CON) 20 MEQ tablet Take 1 tablet (20 mEq total) by mouth once.  (Patient taking differently: Take 20 mEq by mouth daily. ) 30 tablet 11   No current facility-administered medications for this visit.    Allergies  Allergen Reactions  . Penicillins Other (See Comments)    Has patient had a PCN reaction causing immediate rash, facial/tongue/throat swelling, SOB or lightheadedness with hypotension: No Has patient had a PCN reaction causing severe rash involving mucus membranes or skin necrosis: No Has patient had a PCN reaction that required hospitalization No Has patient had a PCN reaction occurring within the last 10 years: No If all of the above answers are "NO", then may proceed with Cephalosporin use.      Review of Systems:   General:  normal appetite, normal energy, no weight gain, no weight loss, no fever  Cardiac:  no chest pain with exertion, no chest pain at rest, no SOB with exertion, no resting SOB, no PND, no orthopnea, no palpitations, no arrhythmia, no atrial fibrillation, no LE edema, no dizzy spells, no syncope  Respiratory:  no shortness of breath, no home oxygen, no productive cough, no dry cough, no bronchitis, no wheezing, no hemoptysis, no asthma, no pain with inspiration or cough, no sleep apnea, no CPAP at night  GI:   no difficulty swallowing, no reflux, no frequent heartburn, no hiatal hernia, no abdominal pain, no constipation, no diarrhea, no hematochezia, no hematemesis, no melena  GU:   no dysuria,  no frequency, no urinary tract infection, no hematuria, no kidney stones, no kidney disease  Vascular:  no pain suggestive of claudication, no pain in feet, no leg cramps, no varicose veins, no DVT, no non-healing foot ulcer  Neuro:   no stroke, no TIA's, no seizures, no headaches, no temporary blindness one eye,  no slurred speech, no peripheral neuropathy, no chronic pain, no instability of gait, no memory/cognitive dysfunction  Musculoskeletal: no arthritis, no joint swelling, no myalgias, no difficulty walking, normal mobility     Skin:   no rash, no itching, no skin infections, no pressure sores or ulcerations  Psych:   no anxiety, no depression, no nervousness, no unusual recent stress  Eyes:   no blurry vision, no floaters, no recent vision changes, does not wears glasses or contacts  ENT:   no hearing loss, no loose or painful teeth, no dentures, last saw dentist October 2016  Hematologic:  no easy bruising, no abnormal bleeding, no clotting disorder, no frequent epistaxis  Endocrine:  no diabetes, does not check CBG's at home     Physical Exam:   BP 154/84 mmHg  Pulse 79  Resp 16  Ht 5' 7.5" (1.715 m)  Wt 157 lb (71.215 kg)  BMI 24.21 kg/m2  SpO2 98%  General:  Slightly obese,  well-appearing  HEENT:  Unremarkable   Neck:   no JVD, no bruits, no adenopathy   Chest:   clear to auscultation, symmetrical breath sounds, no wheezes, no rhonchi   CV:   RRR, prominent grade IV/VI holosystolic murmur best at LLSB  Abdomen:  soft, non-tender, no masses   Extremities:  warm, well-perfused, pulses palpable, no LE edema  Rectal/GU  Deferred  Neuro:   Grossly non-focal and symmetrical throughout  Skin:   Clean and dry, no rashes, no breakdown   Diagnostic Tests:  Transthoracic Echocardiography  Patient:  Caila, Bonvillain MR #:    [id number] Study Date: 02/21/2015 Gender:   F Age:    24 Height:   167.6 cm Weight:   69.4 kg BSA:    1.81 m^2 Pt. Status: Room:  ATTENDING  Sonia Side   Panosh, Wanda K REFERRING  Panosh, Standley Brooking SONOGRAPHER Marygrace Drought, RCS PERFORMING  [facility], Outpatient  cc:  ------------------------------------------------------------------- LV EF: 55% -  60%  ------------------------------------------------------------------- Indications:   Murmur (R01.0).  ------------------------------------------------------------------- History:  PMH: [place] hypertension, Hx of Breast  cancer(2002)  ------------------------------------------------------------------- Study Conclusions  - Left ventricle: The cavity size was normal. There was mild concentric hypertrophy. Systolic function was normal. The estimated ejection fraction was in the range of 55% to 60%. Wall motion was normal; there were no regional wall motion abnormalities. Features are consistent with a pseudonormal left ventricular filling pattern, with concomitant abnormal relaxation and increased filling pressure (grade 2 diastolic dysfunction). Doppler parameters are consistent with elevated ventricular end-diastolic filling pressure. - Aortic valve: Trileaflet; normal thickness leaflets. There was no regurgitation. - Aortic root: The aortic root was normal in size. - Mitral valve: Mildly thickened leaflets. There is prolapse of the posterior mitral valve leaflet with posteriorly directed jet of mitral regurgitations. Mitral regurgitation appears severe. There was severe regurgitation. - Left atrium: The atrium was mildly dilated. - Right ventricle: Systolic function was normal. - Right atrium: The atrium was normal in size. - Tricuspid valve: Structurally normal valve. There was mild regurgitation. - Pulmonic valve: There was trivial regurgitation. - Pulmonary arteries: Systolic pressure was within the normal range. - Pericardium, extracardiac: There was no pericardial effusion.  Impressions:  - There is prolapse of the posterior mitral valve leaflet with posteriorly directed jet of mitral regurgitations. Mitral regurgitation appears severe.  Transthoracic echocardiography. M-mode, complete 2D, spectral Doppler, and color Doppler. Birthdate: Patient birthdate: 03-24-1955. Age: Patient is 60 yr old. Sex: Gender: female. BMI: 24.7 kg/m^2. Blood pressure:   162/88 Patient status: Outpatient. Study date: Study date: 02/21/2015. Study time: 08:44 AM.  Location: [facility]  -------------------------------------------------------------------  ------------------------------------------------------------------- Left ventricle: The cavity size was normal. There was mild concentric hypertrophy. Systolic function was normal. The estimated ejection fraction was in the range of 55% to 60%. Wall motion was normal; there were no regional wall motion abnormalities. Features are consistent with a pseudonormal left ventricular filling pattern, with concomitant abnormal relaxation and increased filling pressure (grade 2 diastolic dysfunction). Doppler parameters are consistent with elevated ventricular end-diastolic filling pressure.  ------------------------------------------------------------------- Aortic valve:  Trileaflet; normal thickness leaflets. Mobility was not restricted. Doppler: Transvalvular velocity was within the normal range. There was no stenosis. There was no regurgitation.  ------------------------------------------------------------------- Aorta: Aortic root: The aortic root was normal in size.  ------------------------------------------------------------------- Mitral valve: Mildly thickened leaflets. There is prolapse of the posterior mitral valve leaflet with posteriorly directed jet of mitral regurgitations.  Mitral regurgitation appears severe. Mobility was not restricted. Doppler: Transvalvular velocity was within the normal range. There was no evidence for stenosis. There was severe regurgitation.  Peak gradient (D): 8 mm Hg.  ------------------------------------------------------------------- Left atrium: The atrium was mildly dilated.  ------------------------------------------------------------------- Right ventricle: The cavity size was normal. Wall thickness was normal. Systolic function was normal.  ------------------------------------------------------------------- Pulmonic valve:   Doppler: Transvalvular velocity was within the normal range. There was no evidence for stenosis. There was trivial regurgitation.  ------------------------------------------------------------------- Tricuspid valve:  Structurally normal valve.  Doppler: Transvalvular velocity was within the normal range. There was mild regurgitation.  ------------------------------------------------------------------- Pulmonary artery:  The main pulmonary artery was normal-sized. Systolic pressure was within the normal range.  ------------------------------------------------------------------- Right atrium: The atrium was normal in size.  ------------------------------------------------------------------- Pericardium: There was no pericardial effusion.  ------------------------------------------------------------------- Systemic veins: Inferior vena cava: The vessel was normal in size. The respirophasic diameter changes were in the normal range (= 50%), consistent with normal central venous pressure. Diameter: 14 mm.  ------------------------------------------------------------------- Measurements  IVC                     Value    Reference ID                     14  mm   ---------  Left ventricle               Value    Reference LV ID, ED, PLAX chordal           51  mm   43 - 52 LV ID, ES, PLAX chordal           33.4 mm   23 - 38 LV fx shortening, PLAX chordal       35  %   >=29 LV PW thickness, ED             11.9 mm   --------- IVS/LV PW ratio, ED             0.99     <=1.3 Stroke volume, 2D              58  ml   --------- Stroke volume/bsa, 2D            32  ml/m^2 --------- LV ejection fraction, 1-p A4C        60  %   --------- LV e&', lateral               10.3 cm/s   --------- LV E/e&', lateral              13.59    --------- LV e&', medial                9.54 cm/s  --------- LV E/e&', medial               14.68    --------- LV e&', average               9.92 cm/s  --------- LV E/e&', average              14.11    ---------  Ventricular septum             Value    Reference IVS thickness, ED              11.8 mm   ---------  LVOT  Value    Reference LVOT ID, S                 19  mm   --------- LVOT area                  2.84 cm^2  --------- LVOT peak velocity, S            96.3 cm/s  --------- LVOT mean velocity, S            67.2 cm/s  --------- LVOT VTI, S                 20.3 cm   --------- LVOT peak gradient, S            4   mm Hg ---------  Aorta                    Value    Reference Aortic root ID, ED             31  mm   ---------  Left atrium                 Value    Reference LA ID, A-P, ES               39  mm   --------- LA ID/bsa, A-P               2.16 cm/m^2 <=2.2 LA volume, S                87  ml   --------- LA volume/bsa, S              48.1 ml/m^2 --------- LA volume, ES, 1-p A4C           88  ml   --------- LA volume/bsa, ES, 1-p A4C         48.7 ml/m^2 --------- LA volume, ES, 1-p A2C           79  ml   --------- LA volume/bsa, ES, 1-p A2C         43.7 ml/m^2 ---------  Mitral valve                Value    Reference Mitral E-wave peak velocity         140  cm/s  --------- Mitral A-wave peak velocity         82.4 cm/s   --------- Mitral deceleration time      (L)   148  ms   150 - 230 Mitral peak gradient, D           8   mm Hg --------- Mitral E/A ratio, peak           1.7     ---------  Pulmonary arteries             Value    Reference PA pressure, S, DP             29  mm Hg <=30  Tricuspid valve               Value    Reference Tricuspid regurg peak velocity       254  cm/s  --------- Tricuspid peak RV-RA gradient        26  mm Hg --------- Tricuspid maximal regurg velocity,     254  cm/s  --------- PISA  Systemic veins  Value    Reference Estimated CVP                3   mm Hg ---------  Right ventricle               Value    Reference RV pressure, S, DP             29  mm Hg <=30 RV s&', lateral, S              14.9 cm/s  ---------  Legend: (L) and (H) mark values outside specified reference range.  ------------------------------------------------------------------- Prepared and Electronically Authenticated by  Ena Dawley, M.D. 2016-12-14T15:15:36    Transesophageal Echocardiography  Patient:  Damani, Bardin MR #:    [id number] Study Date: 04/25/2015 Gender:   F Age:    84 Height:   170.2 cm Weight:   71.4 kg BSA:    1.85 m^2 Pt. Status: Room:  ATTENDING  Mertie Moores, M.D. PERFORMING  Mertie Moores, M.D. REFERRING  Mertie Moores, M.D. SONOGRAPHER Donata Clay ADMITTING  Nahser, Peggyann Juba   Nahser, Jr  cc:  ------------------------------------------------------------------- LV EF: 60% -  65%  ------------------------------------------------------------------- Indications:   Mitral valve prolapse 424.0.  ------------------------------------------------------------------- Study  Conclusions  - Left ventricle: Systolic function was normal. The estimated ejection fraction was in the range of 60% to 65%. Wall motion was normal; there were no regional wall motion abnormalities. - Aortic valve: No evidence of vegetation. - Mitral valve: Severe prolapse, involving the posterior leaflet. There was severe regurgitation. - Left atrium: The atrium was moderately dilated. No evidence of thrombus in the atrial cavity or appendage. - Right atrium: No evidence of thrombus in the atrial cavity or appendage. - Atrial septum: No defect or patent foramen ovale was identified. - Pulmonic valve: No evidence of vegetation.  Diagnostic transesophageal echocardiography. 2D and color Doppler. Birthdate: Patient birthdate: 1955/05/03. Age: Patient is 60 yr old. Sex: Gender: female.  BMI: 24.6 kg/m^2. Blood pressure: 145/65 Patient status: Outpatient. Study date: Study date: 04/25/2015. Study time: 09:04 AM. Location: Endoscopy.  -------------------------------------------------------------------  ------------------------------------------------------------------- Left ventricle: Systolic function was normal. The estimated ejection fraction was in the range of 60% to 65%. Wall motion was normal; there were no regional wall motion abnormalities.  ------------------------------------------------------------------- Aortic valve:  Structurally normal valve.  Cusp separation was normal. No evidence of vegetation. Doppler: There was no regurgitation.  ------------------------------------------------------------------- Aorta: The aorta was normal, not dilated, and non-diseased.  ------------------------------------------------------------------- Mitral valve: There is evidence for reversal of flow in the pulmonary veins. There is a partially flail scallop of the posterior leaflet. Severe prolapse, involving the posterior leaflet. Doppler:  There was severe regurgitation.  ------------------------------------------------------------------- Left atrium: The atrium was moderately dilated. No evidence of thrombus in the atrial cavity or appendage.  ------------------------------------------------------------------- Atrial septum: No defect or patent foramen ovale was identified.  ------------------------------------------------------------------- Right ventricle: The cavity size was normal. Wall thickness was normal. Systolic function was normal.  ------------------------------------------------------------------- Pulmonic valve:  Structurally normal valve.  Cusp separation was normal. No evidence of vegetation.  ------------------------------------------------------------------- Tricuspid valve:  The valve appears to be grossly normal. Doppler: There was no regurgitation.  ------------------------------------------------------------------- Right atrium: The atrium was normal in size. No evidence of thrombus in the atrial cavity or appendage.  ------------------------------------------------------------------- Post procedure conclusions Ascending Aorta:  - The aorta was normal, not dilated, and non-diseased.  ------------------------------------------------------------------- Prepared and Electronically Authenticated by  Mertie Moores, M.D. 2017-02-15T13:11:35    Impression:  Patient has stage C  severe asymptomatic primary mitral regurgitation.  I have personally reviewed the patient's recent transthoracic and transesophageal echocardiogram.  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 The jet of regurgitation wraps completely around the entire left atrium. There is flow reversal in the pulmonary veins. Left ventricular systolic function remains normal. There are no other complicating features. Options  include continued close observation on medical therapy versus proceeding with elective mitral valve repair in the near future. Risks associated with elective surgery should be very low and I feel that there is a greater than 95% likelihood her valve can be repaired with anticipation of a durable result.  She will likely be a good candidate for minimally invasive approach for surgery, although diagnostic cardiac catheterization has not yet been performed.   Plan:  The patient was counseled at length regarding the indications, risks and potential benefits of mitral valve repair.  The rationale for elective surgery has been explained, including a comparison between surgery and continued medical therapy with close follow-up.  The likelihood of successful and durable valve repair has been discussed with particular reference to the findings of their recent echocardiogram.  Based upon these findings and previous experience, I have quoted them a greater than 95 percent likelihood of successful valve repair.  Expectations for the patient's postoperative convalescence have been discussed.  All of her questions have been addressed. The patient is interested in proceeding with surgery in the near future. She has been scheduled for elective cardiac catheterization in approximately 3 weeks. The patient will return after her catheterization has been completed to discuss treatment options further and make final plans for surgery. We will obtain a CT angiogram of the aorta and iliac vessels to evaluate the feasibility of peripheral cannulation for surgery.   I spent in excess of 90 minutes during the conduct of this office consultation and >50% of this time involved direct face-to-face encounter with the patient for counseling and/or coordination of their care.  Valentina Gu. Roxy Manns, MD 05/07/2015 1:58 PM

## 2015-05-08 ENCOUNTER — Encounter: Payer: BLUE CROSS/BLUE SHIELD | Admitting: Thoracic Surgery (Cardiothoracic Vascular Surgery)

## 2015-05-22 ENCOUNTER — Other Ambulatory Visit (INDEPENDENT_AMBULATORY_CARE_PROVIDER_SITE_OTHER): Payer: BLUE CROSS/BLUE SHIELD | Admitting: *Deleted

## 2015-05-22 DIAGNOSIS — I340 Nonrheumatic mitral (valve) insufficiency: Secondary | ICD-10-CM

## 2015-05-22 LAB — BASIC METABOLIC PANEL
BUN: 18 mg/dL (ref 7–25)
CALCIUM: 9.5 mg/dL (ref 8.6–10.4)
CO2: 30 mmol/L (ref 20–31)
Chloride: 101 mmol/L (ref 98–110)
Creat: 0.62 mg/dL (ref 0.50–1.05)
GLUCOSE: 98 mg/dL (ref 65–99)
Potassium: 4.1 mmol/L (ref 3.5–5.3)
Sodium: 140 mmol/L (ref 135–146)

## 2015-05-22 LAB — CBC WITH DIFFERENTIAL/PLATELET
Basophils Absolute: 0 10*3/uL (ref 0.0–0.1)
Basophils Relative: 0 % (ref 0–1)
Eosinophils Absolute: 0.2 10*3/uL (ref 0.0–0.7)
Eosinophils Relative: 3 % (ref 0–5)
HEMATOCRIT: 38.3 % (ref 36.0–46.0)
HEMOGLOBIN: 13.2 g/dL (ref 12.0–15.0)
LYMPHS PCT: 29 % (ref 12–46)
Lymphs Abs: 1.9 10*3/uL (ref 0.7–4.0)
MCH: 29.5 pg (ref 26.0–34.0)
MCHC: 34.5 g/dL (ref 30.0–36.0)
MCV: 85.7 fL (ref 78.0–100.0)
MONOS PCT: 10 % (ref 3–12)
MPV: 9.3 fL (ref 8.6–12.4)
Monocytes Absolute: 0.7 10*3/uL (ref 0.1–1.0)
NEUTROS ABS: 3.8 10*3/uL (ref 1.7–7.7)
NEUTROS PCT: 58 % (ref 43–77)
Platelets: 313 10*3/uL (ref 150–400)
RBC: 4.47 MIL/uL (ref 3.87–5.11)
RDW: 14.3 % (ref 11.5–15.5)
WBC: 6.5 10*3/uL (ref 4.0–10.5)

## 2015-05-22 LAB — PROTIME-INR
INR: 1 (ref <1.50)
Prothrombin Time: 13.3 seconds (ref 11.6–15.2)

## 2015-05-22 NOTE — Addendum Note (Signed)
Addended by: Eulis Foster on: 05/22/2015 07:58 AM   Modules accepted: Orders

## 2015-05-25 ENCOUNTER — Other Ambulatory Visit: Payer: Self-pay | Admitting: Neurology

## 2015-05-25 ENCOUNTER — Other Ambulatory Visit: Payer: Self-pay | Admitting: Thoracic Surgery (Cardiothoracic Vascular Surgery)

## 2015-05-25 ENCOUNTER — Other Ambulatory Visit: Payer: Self-pay | Admitting: Cardiothoracic Surgery

## 2015-05-25 ENCOUNTER — Other Ambulatory Visit: Payer: Self-pay | Admitting: Diagnostic Neuroimaging

## 2015-05-25 ENCOUNTER — Other Ambulatory Visit: Payer: Self-pay | Admitting: Surgery

## 2015-05-28 ENCOUNTER — Inpatient Hospital Stay: Admission: RE | Admit: 2015-05-28 | Payer: BLUE CROSS/BLUE SHIELD | Source: Ambulatory Visit

## 2015-05-28 ENCOUNTER — Other Ambulatory Visit: Payer: BLUE CROSS/BLUE SHIELD

## 2015-05-28 ENCOUNTER — Other Ambulatory Visit: Payer: Self-pay | Admitting: Cardiovascular Disease

## 2015-05-28 DIAGNOSIS — I340 Nonrheumatic mitral (valve) insufficiency: Secondary | ICD-10-CM

## 2015-05-29 ENCOUNTER — Encounter (HOSPITAL_COMMUNITY)
Admission: RE | Disposition: A | Discharge status: Home / Self Care | Payer: Self-pay | Source: Ambulatory Visit | Attending: Cardiology

## 2015-05-29 ENCOUNTER — Encounter (HOSPITAL_COMMUNITY): Payer: Self-pay | Admitting: Cardiology

## 2015-05-29 ENCOUNTER — Ambulatory Visit (HOSPITAL_COMMUNITY)
Admission: RE | Admit: 2015-05-29 | Discharge: 2015-05-29 | Disposition: A | Discharge status: Home / Self Care | Payer: BLUE CROSS/BLUE SHIELD | Source: Ambulatory Visit | Attending: Cardiology | Admitting: Cardiology

## 2015-05-29 DIAGNOSIS — Z8249 Family history of ischemic heart disease and other diseases of the circulatory system: Secondary | ICD-10-CM | POA: Insufficient documentation

## 2015-05-29 DIAGNOSIS — M810 Age-related osteoporosis without current pathological fracture: Secondary | ICD-10-CM | POA: Insufficient documentation

## 2015-05-29 DIAGNOSIS — Z853 Personal history of malignant neoplasm of breast: Secondary | ICD-10-CM | POA: Insufficient documentation

## 2015-05-29 DIAGNOSIS — Z7982 Long term (current) use of aspirin: Secondary | ICD-10-CM | POA: Diagnosis not present

## 2015-05-29 DIAGNOSIS — I340 Nonrheumatic mitral (valve) insufficiency: Secondary | ICD-10-CM | POA: Diagnosis not present

## 2015-05-29 DIAGNOSIS — I341 Nonrheumatic mitral (valve) prolapse: Secondary | ICD-10-CM | POA: Diagnosis not present

## 2015-05-29 HISTORY — PX: CARDIAC CATHETERIZATION: SHX172

## 2015-05-29 LAB — POCT I-STAT 3, VENOUS BLOOD GAS (G3P V)
Acid-Base Excess: 1 mmol/L (ref 0.0–2.0)
Bicarbonate: 25.7 mEq/L — ABNORMAL HIGH (ref 20.0–24.0)
O2 Saturation: 72 %
PCO2 VEN: 42.5 mmHg — AB (ref 45.0–50.0)
TCO2: 27 mmol/L (ref 0–100)
pH, Ven: 7.39 — ABNORMAL HIGH (ref 7.250–7.300)
pO2, Ven: 39 mmHg (ref 31.0–45.0)

## 2015-05-29 LAB — POCT I-STAT 3, ART BLOOD GAS (G3+)
BICARBONATE: 24.4 meq/L — AB (ref 20.0–24.0)
O2 Saturation: 94 %
PH ART: 7.394 (ref 7.350–7.450)
PO2 ART: 72 mmHg — AB (ref 80.0–100.0)
TCO2: 26 mmol/L (ref 0–100)
pCO2 arterial: 39.9 mmHg (ref 35.0–45.0)

## 2015-05-29 SURGERY — RIGHT/LEFT HEART CATH AND CORONARY ANGIOGRAPHY
Anesthesia: LOCAL

## 2015-05-29 MED ORDER — SODIUM CHLORIDE 0.9% FLUSH: 3.0000 mL | INTRAVENOUS | Status: DC | PRN

## 2015-05-29 MED ORDER — FENTANYL CITRATE (PF) 100 MCG/2ML IJ SOLN
INTRAMUSCULAR | Status: DC | PRN
  Administered 2015-05-29 (×2): 25 ug via INTRAVENOUS

## 2015-05-29 MED ORDER — SODIUM CHLORIDE 0.9 % IV SOLN: 250.0000 mL | INTRAVENOUS | Status: DC | PRN

## 2015-05-29 MED ORDER — MIDAZOLAM HCL 2 MG/2ML IJ SOLN
INTRAMUSCULAR | Status: DC | PRN
  Administered 2015-05-29: 2 mg via INTRAVENOUS
  Administered 2015-05-29: 1 mg via INTRAVENOUS

## 2015-05-29 MED ORDER — VERAPAMIL HCL 2.5 MG/ML IV SOLN
INTRAVENOUS | Status: DC | PRN
  Administered 2015-05-29: 10 mL via INTRA_ARTERIAL

## 2015-05-29 MED ORDER — MIDAZOLAM HCL 2 MG/2ML IJ SOLN
INTRAMUSCULAR | Status: AC
  Filled 2015-05-29: qty 2

## 2015-05-29 MED ORDER — SODIUM CHLORIDE 0.9 % WEIGHT BASED INFUSION: 3.0000 mL/kg/h | INTRAVENOUS | Status: DC

## 2015-05-29 MED ORDER — SODIUM CHLORIDE 0.9% FLUSH: 3.0000 mL | Freq: Two times a day (BID) | INTRAVENOUS | Status: DC

## 2015-05-29 MED ORDER — ASPIRIN 81 MG PO CHEW
CHEWABLE_TABLET | ORAL | Status: AC
  Administered 2015-05-29: 81 mg via ORAL
  Filled 2015-05-29: qty 1

## 2015-05-29 MED ORDER — HEPARIN SODIUM (PORCINE) 1000 UNIT/ML IJ SOLN
INTRAMUSCULAR | Status: DC | PRN
  Administered 2015-05-29: 3500 [IU] via INTRAVENOUS

## 2015-05-29 MED ORDER — SODIUM CHLORIDE 0.9 % WEIGHT BASED INFUSION: 1.0000 mL/kg/h | INTRAVENOUS | Status: DC

## 2015-05-29 MED ORDER — HEPARIN (PORCINE) IN NACL 2-0.9 UNIT/ML-% IJ SOLN
INTRAMUSCULAR | Status: AC
  Filled 2015-05-29: qty 1000

## 2015-05-29 MED ORDER — IOPAMIDOL (ISOVUE-370) INJECTION 76%
INTRAVENOUS | Status: AC
  Filled 2015-05-29: qty 100

## 2015-05-29 MED ORDER — IOPAMIDOL (ISOVUE-370) INJECTION 76%
INTRAVENOUS | Status: DC | PRN
  Administered 2015-05-29: 50 mL via INTRAVENOUS

## 2015-05-29 MED ORDER — LIDOCAINE HCL (PF) 1 % IJ SOLN
INTRAMUSCULAR | Status: AC
  Filled 2015-05-29: qty 30

## 2015-05-29 MED ORDER — FENTANYL CITRATE (PF) 100 MCG/2ML IJ SOLN
INTRAMUSCULAR | Status: AC
  Filled 2015-05-29: qty 2

## 2015-05-29 MED ORDER — VERAPAMIL HCL 2.5 MG/ML IV SOLN
INTRAVENOUS | Status: AC
  Filled 2015-05-29: qty 2

## 2015-05-29 MED ORDER — HEPARIN (PORCINE) IN NACL 2-0.9 UNIT/ML-% IJ SOLN
INTRAMUSCULAR | Status: DC | PRN
  Administered 2015-05-29: 1500 mL

## 2015-05-29 MED ORDER — LIDOCAINE HCL (PF) 1 % IJ SOLN
INTRAMUSCULAR | Status: DC | PRN
  Administered 2015-05-29: 5 mL

## 2015-05-29 MED ORDER — HEPARIN SODIUM (PORCINE) 1000 UNIT/ML IJ SOLN
INTRAMUSCULAR | Status: AC
  Filled 2015-05-29: qty 1

## 2015-05-29 MED ORDER — ASPIRIN 81 MG PO CHEW
81.0000 mg | CHEWABLE_TABLET | ORAL | Status: AC
  Administered 2015-05-29: 81 mg via ORAL

## 2015-05-29 MED ORDER — SODIUM CHLORIDE 0.9 % WEIGHT BASED INFUSION
3.0000 mL/kg/h | INTRAVENOUS | Status: DC
  Administered 2015-05-29: 3 mL/kg/h via INTRAVENOUS

## 2015-05-29 SURGICAL SUPPLY — 16 items
CATH BALLN WEDGE 5F 110CM (CATHETERS) ×1 IMPLANT
CATH INFINITI 5 FR JL3.5 (CATHETERS) ×1 IMPLANT
CATH INFINITI 5FR ANG PIGTAIL (CATHETERS) ×1 IMPLANT
CATH INFINITI JR4 5F (CATHETERS) ×1 IMPLANT
DEVICE RAD COMP TR BAND LRG (VASCULAR PRODUCTS) ×2 IMPLANT
GLIDESHEATH SLEND SS 6F .021 (SHEATH) ×1 IMPLANT
GUIDEWIRE .025 260CM (WIRE) ×1 IMPLANT
KIT HEART LEFT (KITS) ×2 IMPLANT
PACK CARDIAC CATHETERIZATION (CUSTOM PROCEDURE TRAY) ×2 IMPLANT
SHEATH FAST CATH BRACH 5F 5CM (SHEATH) ×1 IMPLANT
SYR MEDRAD MARK V 150ML (SYRINGE) ×2 IMPLANT
TRANSDUCER W/STOPCOCK (MISCELLANEOUS) ×4 IMPLANT
TUBING CIL FLEX 10 FLL-RA (TUBING) ×2 IMPLANT
WIRE EMERALD 3MM-J .025X260CM (WIRE) ×1 IMPLANT
WIRE HI TORQ VERSACORE-J 145CM (WIRE) ×1 IMPLANT
WIRE SAFE-T 1.5MM-J .035X260CM (WIRE) ×1 IMPLANT

## 2015-05-29 NOTE — Interval H&P Note (Signed)
History and Physical Interval Note:  05/29/2015 10:44 AM  Tracy Chen  has presented today for surgery, with the diagnosis of severe mitrial regurgitation  The various methods of treatment have been discussed with the patient and family. After consideration of risks, benefits and other options for treatment, the patient has consented to  Procedure(s): Right/Left Heart Cath and Coronary Angiography (N/A) as a surgical intervention .  The patient's history has been reviewed, patient examined, no change in status, stable for surgery.  I have reviewed the patient's chart and labs.  Questions were answered to the patient's satisfaction.     Collier [facility] 05/29/2015 10:44 AM

## 2015-05-29 NOTE — H&P (Signed)
Physician History and Physical    Patient ID: Tracy Chen MRN: [id number] DOB/AGE: December 20, 1955 60 y.o. Admit date: 05/29/2015  Primary Care Physician: Lottie Dawson, MD Primary Cardiologist Liam Rogers MD  HPI: Patient is a 60 year old female with no previous cardiac history who was recently found to have a prominent systolic murmur on routine physical examination by her primary care physician. A transthoracic echocardiogram was performed demonstrating the presence of mitral valve prolapse with severe mitral regurgitation and normal left ventricular systolic function. The patient was referred to Dr. Acie Fredrickson for consultation and underwent transesophageal echocardiogram on 04/25/2015. This confirmed the presence of mitral valve prolapse with a flail segment of the posterior leaflet and severe mitral regurgitation. Left ventricular systolic function remains preserved. The patient was referred for surgical consultation.  The patient is married and lives locally in [place] with her husband. 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 She is originally from [facility] and relocated to [facility] several years ago. She has been physically active and healthy for her entire adult life with exception of the development of breast cancer for which she was treated nearly 15 years ago. She remains physically active and states that she exercises on a regular basis. She does not push her self terribly hard when she exercises, but she specifically denies any symptoms of exertional shortness of breath or chest discomfort. She has not noticed any significant change in her exercise tolerance in recent years. She denies any history of resting shortness of breath, PND, orthopnea, palpitations, dizzy spells, or syncope.  After surgical consultation with Dr. Roxy Manns a right and left heart cath was recommended to complete work up prior to planned mitral valve repair. Patient presents now for this  procedure.   Review of systems complete and found to be negative unless listed above  Past Medical History  Diagnosis Date  . Allergic rhinitis   . HX: breast cancer     hx of 2002 left er/pr 3.2cm  . Osteoporosis     L5  . History of chickenpox     as a child  . Gallstone   . Breast cancer ([place])   . 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 Mitral valve prolapse 02/21/2015  . Severe mitral regurgitation 02/21/2015    Family History  Problem Relation Age of Onset  . Osteoporosis Maternal Aunt   . Colon cancer Maternal Aunt   . Thyroid disease    . Heart murmur Brother     Picked up on exam followed by echo.  . Colon cancer Mother   . Valvular heart disease Father     followed     Social History   Social History  . Marital Status: Married    Spouse Name: N/A  . Number of Children: N/A  . Years of Education: N/A   Occupational History  . Not on file.   Social History Main Topics  . Smoking status: Never Smoker   . Smokeless tobacco: Never Used  . Alcohol Use: No  . Drug Use: No  . Sexual Activity: Not on file   Other Topics Concern  . Not on file   Social History Narrative   Regular exercise- some   HH of 3     Pets outside goats and chicken.   1 cat    No ets.   Now working in [employer] office . 40 hours  Stopped second job At [employer]   26+  Neg ets   G2P2   6.5-7 hours    Neg ta caffiene .     Past Surgical History  Procedure Laterality Date  . Abd [place]      gallstones CT gallstones 1/06  . Parathyroidectomy  11/06    left inferior  . Partial left mastectomy     . Colonoscopy    . Tee without cardioversion N/A 04/25/2015    Procedure: TRANSESOPHAGEAL ECHOCARDIOGRAM (TEE);  Surgeon: Thayer Headings, MD;  Location: [place];  Service: Cardiovascular;  Laterality: N/A;     Prescriptions prior to admission  Medication Sig Dispense Refill Last Dose  . aspirin 81 MG tablet Take 81 mg by mouth daily. FOR PROCEDURE ONLY   05/28/2015 at  2100  . hydrochlorothiazide (HYDRODIURIL) 25 MG tablet Take 1 tablet (25 mg total) by mouth daily. 30 tablet 11 05/28/2015 at 1600  . potassium chloride SA (K-DUR,KLOR-CON) 20 MEQ tablet Take 1 tablet (20 mEq total) by mouth once. (Patient taking differently: Take 20 mEq by mouth daily. ) 30 tablet 11 05/28/2015 at 1600    Physical Exam: Blood pressure 150/74, pulse 80, temperature 98.2 F (36.8 C), temperature source Oral, resp. rate 18, height 5' 7.5" (1.715 m), weight 70.308 kg (155 lb), SpO2 100 %.  Current Weight  05/29/15 70.308 kg (155 lb)  05/07/15 71.215 kg (157 lb)  04/25/15 71.215 kg (157 lb)    GENERAL:  Well appearing, EF in NAD HEENT:  PERRL, EOMI, sclera are clear. Oropharynx is clear. NECK:  No jugular venous distention, carotid upstroke brisk and symmetric, no bruits, no thyromegaly or adenopathy LUNGS:  Clear to auscultation bilaterally CHEST:  Unremarkable HEART:  RRR,  PMI not displaced or sustained,S1 and S2 within normal limits, no S3, no S4: gr 4/6 harsh holosystolic murmur at the LSB and apex. ABD:  Soft, nontender. BS +, no masses or bruits. No hepatomegaly, no splenomegaly EXT:  2 + pulses throughout, no edema, no cyanosis no clubbing SKIN:  Warm and dry.  No rashes NEURO:  Alert and oriented x 3. Cranial nerves II through XII intact. PSYCH:  Cognitively intact    Labs:   Lab Results  Component Value Date   WBC 6.5 05/22/2015   HGB 13.2 05/22/2015   HCT 38.3 05/22/2015   MCV 85.7 05/22/2015   PLT 313 05/22/2015   No results for input(s): NA, K, CL, CO2, BUN, CREATININE, CALCIUM, PROT, BILITOT, ALKPHOS, ALT, AST, GLUCOSE in the last 168 hours.  Invalid input(s): LABALBU No results found for: CKMB, CKMBINDEX, TROPONINI  Lab Results  Component Value Date   CHOL 148 01/23/2015   CHOL 162 11/17/2013   CHOL 149 11/12/2012   Lab Results  Component Value Date   HDL 47.40 01/23/2015   HDL 45.20 11/17/2013   HDL 37.70* 11/12/2012   Lab Results    Component Value Date   LDLCALC 86 01/23/2015   LDLCALC 103* 11/17/2013   LDLCALC 92 11/12/2012   Lab Results  Component Value Date   TRIG 74.0 01/23/2015   TRIG 70.0 11/17/2013   TRIG 97.0 11/12/2012   Lab Results  Component Value Date   CHOLHDL 3 01/23/2015   CHOLHDL 4 11/17/2013   CHOLHDL 4 11/12/2012   No results found for: LDLDIRECT  No results found for: PROBNP Lab Results  Component Value Date   TSH 1.15 01/23/2015   No results found for: HGBA1C  Radiology: No results found.  EKG: 05/29/15: NSR with prolonged QT 488 msec. Otherwise normal.  I have personally reviewed and interpreted this study.  TTE: 02/21/15: Study Conclusions  - Left ventricle: The cavity size was normal. There was mild  concentric hypertrophy. Systolic function was normal. The  estimated ejection fraction was in the range of 55% to 60%. Wall  motion was normal; there were no regional wall motion  abnormalities. Features are consistent with a pseudonormal left  ventricular filling pattern, with concomitant abnormal relaxation  and increased filling pressure (grade 2 diastolic dysfunction).  Doppler parameters are consistent with elevated ventricular  end-diastolic filling pressure. - Aortic valve: Trileaflet; normal thickness leaflets. There was no  regurgitation. - Aortic root: The aortic root was normal in size. - Mitral valve: Mildly thickened leaflets. There is prolapse of the  posterior mitral valve leaflet with posteriorly directed jet of  mitral regurgitations. Mitral regurgitation appears severe. There  was severe regurgitation. - Left atrium: The atrium was mildly dilated. - Right ventricle: Systolic function was normal. - Right atrium: The atrium was normal in size. - Tricuspid valve: Structurally normal valve. There was mild  regurgitation. - Pulmonic valve: There was trivial regurgitation. - Pulmonary arteries: Systolic pressure was within the normal  range. -  Pericardium, extracardiac: There was no pericardial effusion.  Impressions:  - There is prolapse of the posterior mitral valve leaflet with  posteriorly directed jet of mitral regurgitations. Mitral  regurgitation appears severe.  TEE: 04/25/15:Study Conclusions  - Left ventricle: Systolic function was normal. The estimated  ejection fraction was in the range of 60% to 65%. Wall motion was  normal; there were no regional wall motion abnormalities. - Aortic valve: No evidence of vegetation. - Mitral valve: Severe prolapse, involving the posterior leaflet.  There was severe regurgitation. - Left atrium: The atrium was moderately dilated. No evidence of  thrombus in the atrial cavity or appendage. - Right atrium: No evidence of thrombus in the atrial cavity or  appendage. - Atrial septum: No defect or patent foramen ovale was identified. - Pulmonic valve: No evidence of vegetation.  ASSESSMENT AND PLAN:  1. Severe MR with mitral valve prolapse and flail posterior leaflet. Will proceed with right and left heart cath today. The procedure and risks were reviewed including but not limited to death, myocardial infarction, stroke, arrythmias, bleeding, transfusion, emergency surgery, dye allergy, or renal dysfunction. The patient voices understanding and is agreeable to proceed..  Signed: Jaemarie Hochberg [place], [place]  05/29/2015, 8:00 AM

## 2015-05-29 NOTE — Progress Notes (Signed)
Site area: right groin  Site Prior to Removal:  Level 0  Pressure Applied For 20 MINUTES    Minutes Beginning at 1220  Manual:   Yes.    Patient Status During Pull:  WNL  Post Pull Groin Site:  Level 0  Post Pull Instructions Given:  Yes.    Post Pull Pulses Present:  Yes.    Dressing Applied:  Yes.    Comments:  Site WNL

## 2015-05-29 NOTE — Discharge Instructions (Signed)
Radial Site Care °Refer to this sheet in the next few weeks. These instructions provide you with information about caring for yourself after your procedure. Your health care provider may also give you more specific instructions. Your treatment has been planned according to current medical practices, but problems sometimes occur. Call your health care provider if you have any problems or questions after your procedure. °WHAT TO EXPECT AFTER THE PROCEDURE °After your procedure, it is typical to have the following: °· Bruising at the radial site that usually fades within 1-2 weeks. °· Blood collecting in the tissue (hematoma) that may be painful to the touch. It should usually decrease in size and tenderness within 1-2 weeks. °HOME CARE INSTRUCTIONS °· Take medicines only as directed by your health care provider. °· You may shower 24-48 hours after the procedure or as directed by your health care provider. Remove the bandage (dressing) and gently wash the site with plain soap and water. Pat the area dry with a clean towel. Do not rub the site, because this may cause bleeding. °· Do not take baths, swim, or use a hot tub until your health care provider approves. °· Check your insertion site every day for redness, swelling, or drainage. °· Do not apply powder or lotion to the site. °· Do not flex or bend the affected arm for 24 hours or as directed by your health care provider. °· Do not push or pull heavy objects with the affected arm for 24 hours or as directed by your health care provider. °· Do not lift over 10 lb (4.5 kg) for 5 days after your procedure or as directed by your health care provider. °· Ask your health care provider when it is okay to: °¨ Return to work or school. °¨ Resume usual physical activities or sports. °¨ Resume sexual activity. °· Do not drive home if you are discharged the same day as the procedure. Have someone else drive you. °· You may drive 24 hours after the procedure unless otherwise  instructed by your health care provider. °· Do not operate machinery or power tools for 24 hours after the procedure. °· If your procedure was done as an outpatient procedure, which means that you went home the same day as your procedure, a responsible adult should be with you for the first 24 hours after you arrive home. °· Keep all follow-up visits as directed by your health care provider. This is important. °SEEK MEDICAL CARE IF: °· You have a fever. °· You have chills. °· You have increased bleeding from the radial site. Hold pressure on the site. °SEEK IMMEDIATE MEDICAL CARE IF: °· You have unusual pain at the radial site. °· You have redness, warmth, or swelling at the radial site. °· You have drainage (other than a small amount of blood on the dressing) from the radial site. °· The radial site is bleeding, and the bleeding does not stop after 30 minutes of holding steady pressure on the site. °· Your arm or hand becomes pale, cool, tingly, or numb. °  °This information is not intended to replace advice given to you by your health care provider. Make sure you discuss any questions you have with your health care provider. °  °Document Released: 03/29/2010 Document Revised: 03/17/2014 Document Reviewed: 09/12/2013 °Elsevier Interactive Patient Education ©2016 [employer] ° °

## 2015-06-01 ENCOUNTER — Ambulatory Visit
Admission: RE | Admit: 2015-06-01 | Discharge: 2015-06-01 | Disposition: A | Discharge status: Home / Self Care | Payer: BLUE CROSS/BLUE SHIELD | Source: Ambulatory Visit | Attending: Thoracic Surgery (Cardiothoracic Vascular Surgery) | Admitting: Thoracic Surgery (Cardiothoracic Vascular Surgery)

## 2015-06-01 ENCOUNTER — Other Ambulatory Visit: Payer: BLUE CROSS/BLUE SHIELD

## 2015-06-01 DIAGNOSIS — I712 Thoracic aortic aneurysm, without rupture, unspecified: Secondary | ICD-10-CM

## 2015-06-01 DIAGNOSIS — I7409 Other arterial embolism and thrombosis of abdominal aorta: Secondary | ICD-10-CM

## 2015-06-01 DIAGNOSIS — I340 Nonrheumatic mitral (valve) insufficiency: Secondary | ICD-10-CM

## 2015-06-01 MED ORDER — IOPAMIDOL (ISOVUE-370) INJECTION 76%
100.0000 mL | Freq: Once | INTRAVENOUS | Status: AC | PRN
  Administered 2015-06-01: 100 mL via INTRAVENOUS

## 2015-06-04 ENCOUNTER — Other Ambulatory Visit: Payer: Self-pay | Admitting: *Deleted

## 2015-06-04 ENCOUNTER — Ambulatory Visit (INDEPENDENT_AMBULATORY_CARE_PROVIDER_SITE_OTHER): Payer: BLUE CROSS/BLUE SHIELD | Admitting: Thoracic Surgery (Cardiothoracic Vascular Surgery)

## 2015-06-04 ENCOUNTER — Encounter: Payer: Self-pay | Admitting: Thoracic Surgery (Cardiothoracic Vascular Surgery)

## 2015-06-04 VITALS — BP 140/84 | HR 73 | Resp 20 | Ht 67.5 in | Wt 158.0 lb

## 2015-06-04 DIAGNOSIS — I341 Nonrheumatic mitral (valve) prolapse: Secondary | ICD-10-CM | POA: Diagnosis not present

## 2015-06-04 DIAGNOSIS — I340 Nonrheumatic mitral (valve) insufficiency: Secondary | ICD-10-CM

## 2015-06-04 MED ORDER — AMIODARONE HCL 200 MG PO TABS: 200.0000 mg | ORAL_TABLET | Freq: Every day | ORAL | Status: DC

## 2015-06-04 NOTE — Patient Instructions (Signed)
Patient has been instructed to stop taking aspirin and begin taking amiodarone one week prior to your surgery  Patient should continue taking all other medications without change through the day before surgery.  Patient should have nothing to eat or drink after midnight the night before surgery.  On the morning of surgery patient does not need to take any medications

## 2015-06-04 NOTE — Progress Notes (Signed)
[address]       [place] [address]             [phone]     CARDIOTHORACIC SURGERY OFFICE NOTE  Referring Provider is Nahser, Wonda Cheng, MD PCP is Lottie Dawson, MD   HPI:  Patient returns to the office today for follow-up of mitral valve prolapse with stage C severe asymptomatic primary mitral regurgitation.  She was originally seen in consultation on 05/07/2015. Since then she underwent diagnostic cardiac catheterization and CT angiography. She returns to the office today to review the results of these tests and discuss treatment options further. She reports no new problems or complaints.   Current Outpatient Prescriptions  Medication Sig Dispense Refill  . aspirin 81 MG tablet Take 81 mg by mouth daily. FOR PROCEDURE ONLY    . hydrochlorothiazide (HYDRODIURIL) 25 MG tablet Take 1 tablet (25 mg total) by mouth daily. 30 tablet 11  . potassium chloride SA (K-DUR,KLOR-CON) 20 MEQ tablet Take 1 tablet (20 mEq total) by mouth once. (Patient taking differently: Take 20 mEq by mouth daily. ) 30 tablet 11   No current facility-administered medications for this visit.      Physical Exam:   BP 140/84 mmHg  Pulse 73  Resp 20  Ht 5' 7.5" (1.715 m)  Wt 158 lb (71.668 kg)  BMI 24.37 kg/m2  SpO2   General:  Well-appearing  Chest:   Clear to auscultation  CV:   Regular rate and rhythm with prominent holosystolic murmur  Incisions:  n/a  Abdomen:  Soft and nontender  Extremities:  Warm and well-perfused  Diagnostic Tests:  CARDIAC CATHETERIZATION Procedures    Right/Left Heart Cath and Coronary Angiography    Conclusion     There is hyperdynamic left ventricular systolic function.  1. Normal coronary anatomy 2. Normal LV function 3. Normal right heart pressures. 4. Moderate to severe mitral insufficiency      Indications    Severe mitral insufficiency [I34.0 (ICD-10-CM)]    Technique and Indications    Indication: 60 yo  WF with MV prolapse and severe MR due to flail posterior leaflet.  Procedural Details: The right wrist was prepped, draped, and anesthetized with 1% lidocaine. Using the modified Seldinger technique a 6 Fr slender sheath was placed in the right radial artery and a 5 French sheath was placed in the right brachial vein. A Swan-Ganz catheter was used for the right heart catheterization. Standard protocol was followed for recording of right heart pressures and sampling of oxygen saturations. Fick cardiac output was calculated. Standard Judkins catheters were used for selective coronary angiography and left ventriculography. There were no immediate procedural complications. The patient was transferred to the post catheterization recovery area for further monitoring.  Contrast: 60 cc  During this procedure the patient is administered a total of Versed 3 mg and Fentanyl 50 mg to achieve and maintain moderate conscious sedation. The patient's heart rate, blood pressure, and oxygen saturation are monitored continuously during the procedure. The period of conscious sedation is 60 minutes, of which I was present face-to-face 100% of this time.  Estimated blood loss <50 mL. There were no immediate complications during the procedure.    Coronary Findings    Dominance: Right   Left Main  Vessel was injected. Vessel is normal in caliber. Vessel is angiographically normal.     Left Anterior Descending  Vessel was injected. Vessel is normal in caliber. Vessel is angiographically normal.  Left Circumflex  Vessel was injected. Vessel is normal in caliber. Vessel is angiographically normal.     Right Coronary Artery  Vessel was injected. Vessel is normal in caliber. Vessel is angiographically normal.       Right Heart Pressures Normal right heart pressures.  Due to marked tortuosity of the venous system I was unable to advance the Gordy Councilman catheter to a wedge position.    Wall Motion                  Left Heart    Left Ventricle The left ventricular size is normal. There is hyperdynamic left ventricular systolic function. The left ventricular ejection fraction is 55-65% by visual estimate. There are no wall motion abnormalities in the left ventricle.   Mitral Valve There is severe (4+) mitral regurgitation and mitral valve prolapse present.    Coronary Diagrams    Diagnostic Diagram            Implants     No implant documentation for this case.    PACS Images    Show images for Cardiac catheterization     Link to Procedure Log    Procedure Log      Hemo Data       Most Recent Value   Fick Cardiac Output  6.16 L/min   Fick Cardiac Output Index  3.37 (L/min)/BSA   RA A Wave  10 mmHg   RA V Wave  7 mmHg   RA Mean  6 mmHg   RV Systolic Pressure  37 mmHg   RV Diastolic Pressure  5 mmHg   RV EDP  9 mmHg   PA Systolic Pressure  30 mmHg   PA Diastolic Pressure  15 mmHg   PA Mean  22 mmHg   AO Systolic Pressure  123XX123 mmHg   AO Diastolic Pressure  71 mmHg   AO Mean  93 mmHg   LV Systolic Pressure  [id number] mmHg   LV Diastolic Pressure  11 mmHg   LV EDP  21 mmHg   Arterial Occlusion Pressure Extended Systolic Pressure  Q000111Q mmHg   Arterial Occlusion Pressure Extended Diastolic Pressure  72 mmHg   Arterial Occlusion Pressure Extended Mean Pressure  100 mmHg   Left Ventricular Apex Extended Systolic Pressure  Q000111Q mmHg   Left Ventricular Apex Extended Diastolic Pressure  10 mmHg   Left Ventricular Apex Extended EDP Pressure  21 mmHg   QP/QS  1   TPVR Index  6.54 HRUI   TSVR Index  27.63 HRUI   TPVR/TSVR Ratio  0.24     CT ANGIOGRAPHY CHEST, ABDOMEN AND PELVIS  TECHNIQUE: Multidetector CT imaging through the chest, abdomen and pelvis was performed using the standard protocol during bolus administration of intravenous contrast. Multiplanar reconstructed images and MIPs were obtained and reviewed to evaluate the  vascular anatomy.  CONTRAST: 100 mL Isovue 370  COMPARISON: 03/21/2004  FINDINGS: CTA CHEST FINDINGS  Normal caliber of the thoracic aorta without dissection. Bovine arch and the great vessels are patent. Main pulmonary artery measures up to 3.2 cm. Main and central pulmonary arteries are patent without filling defects. Heart size is normal. No significant pericardial or pleural fluid. Surgical clips in the left breast and left axilla.  Trachea and mainstem bronchi are patent. 3 mm nodule at the right lung base on sequence 8, image 43 is probably stable from the examination in 2006. There is a pleural-based 3 mm nodule in the right upper lobe  on sequence 8, image 17 which is indeterminate. 3 mm nodule along the anterior right upper lobe on sequence 8 image 17. Subtle ground-glass disease in the lingula may be related to atelectasis. No large areas of airspace disease or consolidation. No suspicious osseous findings. Evidence for hemangiomas involving the T11 and T10 vertebral bodies.  Review of the MIP images confirms the above findings.  CTA ABDOMEN AND PELVIS FINDINGS  Normal caliber of the abdominal aorta without evidence of atherosclerotic disease or aneurysm. Celiac trunk, IMA and SMA are widely patent. Main bilateral renal arteries are patent. There is a small accessory right renal artery. Normal caliber of the iliac arteries bilaterally without plaque or stenosis. Proximal femoral arteries are widely patent without plaque or aneurysm. Portal venous system is patent. IVC and renal veins are patent.  No acute abnormality to the liver or gallbladder. Normal appearance of the spleen and pancreas. Normal appearance of bilateral adrenal glands. Normal appearance of the urinary bladder, uterus and adnexal structures. Few diverticula involving the sigmoid colon without inflammation. Mild sclerosis and irregularity of the SI joints bilaterally, essentially unchanged  since 2006.  Review of the MIP images confirms the above findings.  IMPRESSION: No significant atherosclerotic disease in the chest, abdomen or pelvis. Normal caliber of the aorta without aneurysm or dissection.  No acute abnormality in the chest, abdomen or pelvis.  At least 3 small nodular densities in the right lung, largest measuring 3 mm. At least 2 of these small nodules are age indeterminate. No follow-up needed if patient is low-risk. Non-contrast chest CT can be considered in 12 months if patient is high-risk. This recommendation follows the consensus statement: Guidelines for Management of Incidental Pulmonary Nodules Detected on CT Images:From the Fleischner Society 2017; published online before print (10.1148/radiol.SG:5268862).   Electronically Signed  By: Markus Daft M.D.  On: 06/01/2015 13:32    Impression:  Patient has stage C severe asymptomatic primary mitral regurgitation. I have personally reviewed the patient's recent transthoracic and transesophageal echocardiogram. 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 The jet of regurgitation wraps completely around the entire left atrium. There is flow reversal in the pulmonary veins. Left ventricular systolic function remains normal. There are no other complicating features. Options include continued close observation on medical therapy versus proceeding with elective mitral valve repair in the near future. Risks associated with elective surgery should be very low and I feel that there is a greater than 95% likelihood her valve can be repaired with anticipation of a durable result. Diagnostic cardiac catheterization is notable for the absence of significant coronary artery disease and normal pulmonary artery pressures.  She appears to be a good candidate for minimally invasive approach for surgery.    Plan:  The  patient was again counseled at length regarding the indications, risks and potential benefits of mitral valve repair.  The rationale for elective surgery has been explained, including a comparison between surgery and continued medical therapy with close follow-up.  The likelihood of successful and durable valve repair has been discussed with particular reference to the findings of their recent echocardiogram.  Based upon these findings and previous experience, I have quoted them a greater than 95 percent likelihood of successful valve repair.  In the unlikely event that their valve cannot be successfully repaired, we discussed the possibility of replacing the mitral valve using a mechanical prosthesis with the attendant need for long-term anticoagulation versus the alternative  of replacing it using a bioprosthetic tissue valve with its potential for late structural valve deterioration and failure, depending upon the patient's longevity.  The patient specifically requests that if the mitral valve must be replaced that it be done using a mechanical valve.   The patient understands and accepts all potential risks of surgery including but not limited to risk of death, stroke or other neurologic complication, myocardial infarction, congestive heart failure, respiratory failure, renal failure, bleeding requiring transfusion and/or reexploration, arrhythmia, infection or other wound complications, pneumonia, pleural and/or pericardial effusion, pulmonary embolus, aortic dissection or other major vascular complication, or delayed complications related to valve repair or replacement including but not limited to structural valve deterioration and failure, thrombosis, embolization, endocarditis, or paravalvular leak.  Alternative surgical approaches have been discussed including a comparison between conventional sternotomy and minimally-invasive techniques.  The relative risks and benefits of each have been reviewed as they  pertain to the patient's specific circumstances, and all of their questions have been addressed.  Specific risks potentially related to the minimally-invasive approach were discussed at length, including but not limited to risk of conversion to full or partial sternotomy, aortic dissection or other major vascular complication, unilateral acute lung injury or pulmonary edema, phrenic nerve dysfunction or paralysis, rib fracture, chronic pain, lung hernia, or lymphocele.  All of their questions have been answered.  She has been given a prescription for amiodarone to begin 1 week prior to surgery to decrease her risk of perioperative atrial arrhythmias.    I spent in excess of 30 minutes during the conduct of this office consultation and >50% of this time involved direct face-to-face encounter with the patient for counseling and/or coordination of their care.  Valentina Gu. Roxy Manns, MD 06/04/2015 11:21 AM

## 2015-06-05 ENCOUNTER — Encounter: Payer: BLUE CROSS/BLUE SHIELD | Admitting: Thoracic Surgery (Cardiothoracic Vascular Surgery)

## 2015-06-26 ENCOUNTER — Ambulatory Visit (HOSPITAL_COMMUNITY)
Admission: RE | Admit: 2015-06-26 | Discharge: 2015-06-26 | Disposition: A | Discharge status: Home / Self Care | Payer: BLUE CROSS/BLUE SHIELD | Source: Ambulatory Visit | Attending: Thoracic Surgery (Cardiothoracic Vascular Surgery) | Admitting: Thoracic Surgery (Cardiothoracic Vascular Surgery)

## 2015-06-26 ENCOUNTER — Telehealth: Payer: Self-pay | Admitting: Cardiovascular Disease

## 2015-06-26 ENCOUNTER — Encounter: Payer: Self-pay | Admitting: Thoracic Surgery (Cardiothoracic Vascular Surgery)

## 2015-06-26 ENCOUNTER — Encounter (HOSPITAL_COMMUNITY)
Admission: RE | Admit: 2015-06-26 | Discharge: 2015-06-26 | Disposition: A | Discharge status: Home / Self Care | Payer: BLUE CROSS/BLUE SHIELD | Source: Ambulatory Visit | Attending: Thoracic Surgery (Cardiothoracic Vascular Surgery) | Admitting: Thoracic Surgery (Cardiothoracic Vascular Surgery)

## 2015-06-26 ENCOUNTER — Ambulatory Visit (HOSPITAL_BASED_OUTPATIENT_CLINIC_OR_DEPARTMENT_OTHER)
Admission: RE | Admit: 2015-06-26 | Discharge: 2015-06-26 | Disposition: A | Discharge status: Home / Self Care | Payer: BLUE CROSS/BLUE SHIELD | Source: Ambulatory Visit | Attending: Thoracic Surgery (Cardiothoracic Vascular Surgery) | Admitting: Thoracic Surgery (Cardiothoracic Vascular Surgery)

## 2015-06-26 ENCOUNTER — Ambulatory Visit (INDEPENDENT_AMBULATORY_CARE_PROVIDER_SITE_OTHER): Payer: BLUE CROSS/BLUE SHIELD | Admitting: Thoracic Surgery (Cardiothoracic Vascular Surgery)

## 2015-06-26 VITALS — BP 137/66 | HR 84 | Temp 98.1°F | Resp 20 | Ht 67.5 in | Wt 158.1 lb

## 2015-06-26 VITALS — BP 158/87 | HR 84 | Resp 20 | Ht 67.5 in | Wt 158.0 lb

## 2015-06-26 DIAGNOSIS — I341 Nonrheumatic mitral (valve) prolapse: Secondary | ICD-10-CM

## 2015-06-26 DIAGNOSIS — Z01812 Encounter for preprocedural laboratory examination: Secondary | ICD-10-CM | POA: Diagnosis not present

## 2015-06-26 DIAGNOSIS — Z01818 Encounter for other preprocedural examination: Secondary | ICD-10-CM | POA: Insufficient documentation

## 2015-06-26 DIAGNOSIS — R9431 Abnormal electrocardiogram [ECG] [EKG]: Secondary | ICD-10-CM | POA: Insufficient documentation

## 2015-06-26 DIAGNOSIS — Z0183 Encounter for blood typing: Secondary | ICD-10-CM | POA: Diagnosis not present

## 2015-06-26 DIAGNOSIS — I340 Nonrheumatic mitral (valve) insufficiency: Secondary | ICD-10-CM

## 2015-06-26 LAB — PULMONARY FUNCTION TEST
DL/VA % pred: 91 %
DL/VA: 4.76 ml/min/mmHg/L
DLCO UNC % PRED: 58 %
DLCO unc: 16.91 ml/min/mmHg
FEF 25-75 PRE: 2.01 L/s
FEF 25-75 Post: 0.89 L/sec
FEF2575-%CHANGE-POST: -55 %
FEF2575-%PRED-POST: 33 %
FEF2575-%PRED-PRE: 76 %
FEV1-%Change-Post: -14 %
FEV1-%PRED-POST: 61 %
FEV1-%Pred-Pre: 71 %
FEV1-Post: 1.81 L
FEV1-Pre: 2.11 L
FEV1FVC-%Change-Post: 4 %
FEV1FVC-%PRED-PRE: 103 %
FEV6-%CHANGE-POST: -17 %
FEV6-%PRED-POST: 58 %
FEV6-%Pred-Pre: 71 %
FEV6-Post: 2.16 L
FEV6-Pre: 2.62 L
FEV6FVC-%Pred-Post: 103 %
FEV6FVC-%Pred-Pre: 103 %
FVC-%CHANGE-POST: -17 %
FVC-%PRED-POST: 56 %
FVC-%Pred-Pre: 69 %
FVC-Post: 2.16 L
FVC-Pre: 2.62 L
POST FEV1/FVC RATIO: 84 %
Post FEV6/FVC ratio: 100 %
Pre FEV1/FVC ratio: 81 %
Pre FEV6/FVC Ratio: 100 %
RV % pred: 57 %
RV: 1.22 L
TLC % pred: 65 %
TLC: 3.67 L

## 2015-06-26 LAB — PROTIME-INR
INR: 0.98 (ref 0.00–1.49)
Prothrombin Time: 13.2 seconds (ref 11.6–15.2)

## 2015-06-26 LAB — COMPREHENSIVE METABOLIC PANEL
ALBUMIN: 4.2 g/dL (ref 3.5–5.0)
ALT: 32 U/L (ref 14–54)
ANION GAP: 13 (ref 5–15)
AST: 28 U/L (ref 15–41)
Alkaline Phosphatase: 90 U/L (ref 38–126)
BILIRUBIN TOTAL: 0.8 mg/dL (ref 0.3–1.2)
BUN: 7 mg/dL (ref 6–20)
CALCIUM: 9.5 mg/dL (ref 8.9–10.3)
CO2: 21 mmol/L — ABNORMAL LOW (ref 22–32)
Chloride: 104 mmol/L (ref 101–111)
Creatinine, Ser: 0.67 mg/dL (ref 0.44–1.00)
GLUCOSE: 114 mg/dL — AB (ref 65–99)
POTASSIUM: 3.4 mmol/L — AB (ref 3.5–5.1)
Sodium: 138 mmol/L (ref 135–145)
TOTAL PROTEIN: 7.1 g/dL (ref 6.5–8.1)

## 2015-06-26 LAB — CBC
HEMATOCRIT: 37.9 % (ref 36.0–46.0)
Hemoglobin: 12.9 g/dL (ref 12.0–15.0)
MCH: 28.7 pg (ref 26.0–34.0)
MCHC: 34 g/dL (ref 30.0–36.0)
MCV: 84.4 fL (ref 78.0–100.0)
Platelets: 280 10*3/uL (ref 150–400)
RBC: 4.49 MIL/uL (ref 3.87–5.11)
RDW: 14 % (ref 11.5–15.5)
WBC: 7 10*3/uL (ref 4.0–10.5)

## 2015-06-26 LAB — BLOOD GAS, ARTERIAL
Acid-Base Excess: 0.3 mmol/L (ref 0.0–2.0)
Bicarbonate: 23.5 meq/L (ref 20.0–24.0)
Drawn by: 449841
FIO2: 0.21
O2 Saturation: 98.2 %
Patient temperature: 98.6
TCO2: 24.5 mmol/L (ref 0–100)
pCO2 arterial: 31.9 mmHg — ABNORMAL LOW (ref 35.0–45.0)
pH, Arterial: 7.479 — ABNORMAL HIGH (ref 7.350–7.450)
pO2, Arterial: 102 mmHg — ABNORMAL HIGH (ref 80.0–100.0)

## 2015-06-26 LAB — URINALYSIS, ROUTINE W REFLEX MICROSCOPIC
Bilirubin Urine: NEGATIVE
GLUCOSE, UA: NEGATIVE mg/dL
Ketones, ur: NEGATIVE mg/dL
Nitrite: NEGATIVE
PROTEIN: NEGATIVE mg/dL
Specific Gravity, Urine: 1.018 (ref 1.005–1.030)
pH: 5.5 (ref 5.0–8.0)

## 2015-06-26 LAB — URINE MICROSCOPIC-ADD ON

## 2015-06-26 LAB — ABO/RH: ABO/RH(D): O POS

## 2015-06-26 LAB — SURGICAL PCR SCREEN
MRSA, PCR: NEGATIVE
Staphylococcus aureus: NEGATIVE

## 2015-06-26 LAB — APTT: aPTT: 28 s (ref 24–37)

## 2015-06-26 MED ORDER — ALBUTEROL SULFATE (2.5 MG/3ML) 0.083% IN NEBU
2.5000 mg | INHALATION_SOLUTION | Freq: Once | RESPIRATORY_TRACT | Status: AC
  Administered 2015-06-26: 2.5 mg via RESPIRATORY_TRACT

## 2015-06-26 MED ORDER — CHLORHEXIDINE GLUCONATE 4 % EX LIQD: 30.0000 mL | CUTANEOUS | Status: DC

## 2015-06-26 NOTE — Telephone Encounter (Signed)
Left message for patient that appointment can be moved to 2-3 months from now due to her upcoming mitral valve surgery.  I cancelled appointment for tomorrow and advised her to call back to schedule an appointment in 2-3 months.

## 2015-06-26 NOTE — Progress Notes (Signed)
[address]       [place] [address]             [phone]     CARDIOTHORACIC SURGERY OFFICE NOTE  Referring Provider is Nahser, Wonda Cheng, MD PCP is Lottie Dawson, MD   HPI:  Patient returns to the office today for follow-up of mitral valve prolapse with stage C severe asymptomatic primary mitral regurgitation. She was originally seen in consultation on 05/07/2015. She was seen most recently on 06/04/2015 and she returns to the office today for follow-up with plans to proceed with elective mitral valve repair later this week. She reports no new problems or complaints.   Current Outpatient Prescriptions  Medication Sig Dispense Refill  . amiodarone (PACERONE) 200 MG tablet Take 1 tablet (200 mg total) by mouth daily with breakfast. 60 tablet 0  . hydrochlorothiazide (HYDRODIURIL) 25 MG tablet Take 1 tablet (25 mg total) by mouth daily. 30 tablet 11  . naphazoline-pheniramine ([employer] EYE ALLERGY RELIEF) 0.025-0.3 % ophthalmic solution Place 1 drop into both eyes 4 (four) times daily as needed for irritation or allergies.    . potassium chloride SA (K-DUR,KLOR-CON) 20 MEQ tablet Take 1 tablet (20 mEq total) by mouth once. (Patient taking differently: Take 20 mEq by mouth daily. ) 30 tablet 11   No current facility-administered medications for this visit.   Facility-Administered Medications Ordered in Other Visits  Medication Dose Route Frequency Provider Last Rate Last Dose  . chlorhexidine (HIBICLENS) 4 % liquid 2 application  30 mL Topical UD Rexene Alberts, MD          Physical Exam:   BP 158/87 mmHg  Pulse 84  Resp 20  Ht 5' 7.5" (1.715 m)  Wt 158 lb (71.668 kg)  BMI 24.37 kg/m2  SpO2 98%  General:  Well-appearing  Chest:   Clear  CV:   Regular rate and rhythm with prominent holosystolic murmur  Incisions:  n/a  Abdomen:  Soft nontender  Extremities:  Warm and well-perfused  Diagnostic Tests:  n/a   Impression:  Patient has stage  C severe asymptomatic primary mitral regurgitation. I have personally reviewed the patient's recent transthoracic and transesophageal echocardiogram. 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 The jet of regurgitation wraps completely around the entire left atrium. There is flow reversal in the pulmonary veins. Left ventricular systolic function remains normal. There are no other complicating features. Options include continued close observation on medical therapy versus proceeding with elective mitral valve repair in the near future. Risks associated with elective surgery should be very low and I feel that there is a greater than 95% likelihood her valve can be repaired with anticipation of a durable result. Diagnostic cardiac catheterization is notable for the absence of significant coronary artery disease and normal pulmonary artery pressures. She appears to be a good candidate for minimally invasive approach for surgery.    Plan:  The patient was again counseled at length regarding the indications, risks and potential benefits of mitral valve repair. The rationale for elective surgery has been explained, including a comparison between surgery and continued medical therapy with close follow-up. The likelihood of successful and durable valve repair has been discussed with particular reference to the findings of their recent echocardiogram. Based upon these findings and previous experience, I have quoted them a greater than 95 percent likelihood of successful valve repair. In the unlikely  event that their valve cannot be successfully repaired, we discussed the possibility of replacing the mitral valve using a mechanical prosthesis with the attendant need for long-term anticoagulation versus the alternative of replacing it using a bioprosthetic tissue valve with its potential for late structural valve  deterioration and failure, depending upon the patient's longevity. The patient specifically requests that if the mitral valve must be replaced that it be done using a mechanical valve. The patient understands and accepts all potential risks of surgery including but not limited to risk of death, stroke or other neurologic complication, myocardial infarction, congestive heart failure, respiratory failure, renal failure, bleeding requiring transfusion and/or reexploration, arrhythmia, infection or other wound complications, pneumonia, pleural and/or pericardial effusion, pulmonary embolus, aortic dissection or other major vascular complication, or delayed complications related to valve repair or replacement including but not limited to structural valve deterioration and failure, thrombosis, embolization, endocarditis, or paravalvular leak. Alternative surgical approaches have been discussed including a comparison between conventional sternotomy and minimally-invasive techniques. The relative risks and benefits of each have been reviewed as they pertain to the patient's specific circumstances, and all of their questions have been addressed. Specific risks potentially related to the minimally-invasive approach were discussed at length, including but not limited to risk of conversion to full or partial sternotomy, aortic dissection or other major vascular complication, unilateral acute lung injury or pulmonary edema, phrenic nerve dysfunction or paralysis, rib fracture, chronic pain, lung hernia, or lymphocele. All of her questions have been answered.  I spent in excess of 15 minutes during the conduct of this office consultation and >50% of this time involved direct face-to-face encounter with the patient for counseling and/or coordination of their care.   Valentina Gu. Roxy Manns, MD 06/26/2015 3:39 PM

## 2015-06-26 NOTE — Pre-Procedure Instructions (Signed)
    Tracy Chen  06/26/2015      [facility] - [place], [place] - [address] [place] [address] Phone: [phone] Fax: [phone]  [facility] [place], [place] - [address] [place] [address] Phone: [phone] Fax: [phone]    Your procedure is scheduled on June 28, 2015.  Report to [facility] Admitting at 5:30 A.M.  Call this number if you have problems the morning of surgery:  [phone]   Remember:  Do not eat food or drink liquids after midnight.  Take these medicines the morning of surgery with A SIP OF WATER : amiodarone (PACERONE) ,  potassium chloride SA (K-DUR,KLOR-CON)   STOP NSAID'S (ALEVE, ADVIL, IBUPROFEN), HERBAL MEDICATIONS, PLAVIX ONE WEEK  PRIOR TO SURGERY   Do not wear jewelry, make-up or nail polish.  Do not wear lotions, powders, or colognes.  You may NOT wear deodorant.  Men may shave face and neck ONLY.  Do not bring valuables to the hospital.  [facility] is not responsible for any belongings or valuables.  Contacts, dentures or bridgework may not be worn into surgery.  Leave your suitcase in the car.  After surgery it may be brought to your room.  For patients admitted to the hospital, discharge time will be determined by your treatment team.  Patients discharged the day of surgery will not be allowed to drive home.   Name and phone number of your driver:    Special instructions:  "PREPARING FOR SURGERY"  Please read over the following fact sheets that you were given. Pain Booklet, Coughing and Deep Breathing, Blood Transfusion Information, Open Heart Packet and Surgical Site Infection Prevention

## 2015-06-26 NOTE — Progress Notes (Signed)
VASCULAR LAB PRELIMINARY  PRELIMINARY  PRELIMINARY  PRELIMINARY  Pre-op Cardiac Surgery  Carotid Findings:  Bilateral - No evidence of significant ICA stenosis. Vertebral artery flow is antegrade.  Upper Extremity Right Left  Brachial Pressures 157 Triphasic 140 Triphasic  Radial Waveforms Triphasic Triphasic  Ulnar Waveforms Triphasic Triphasic  Palmar Arch (Allen's Test) Normal Normal   Findings:  Doppler waveforms remained normal bilaterally with both radial and ulnar compressions.   Jewett Mcgann, [place], RVS 06/26/2015, 2:03 PM

## 2015-06-26 NOTE — Telephone Encounter (Signed)
New Message  Pt called req a call back to determine if the appt for 06/27/2015 is need to see Dr. Acie Fredrickson. She has other appts and if the appt is not needed then she is opting not to come. Please advise

## 2015-06-27 ENCOUNTER — Encounter (HOSPITAL_COMMUNITY): Payer: Self-pay | Admitting: Certified Registered Nurse Anesthetist

## 2015-06-27 ENCOUNTER — Ambulatory Visit: Payer: BLUE CROSS/BLUE SHIELD | Admitting: Cardiovascular Disease

## 2015-06-27 LAB — HEMOGLOBIN A1C
HEMOGLOBIN A1C: 5.5 % (ref 4.8–5.6)
MEAN PLASMA GLUCOSE: 111 mg/dL

## 2015-06-28 ENCOUNTER — Encounter (HOSPITAL_COMMUNITY): Payer: Self-pay | Admitting: Surgery

## 2015-06-28 ENCOUNTER — Inpatient Hospital Stay (HOSPITAL_COMMUNITY): Payer: BLUE CROSS/BLUE SHIELD | Admitting: Certified Registered Nurse Anesthetist

## 2015-06-28 ENCOUNTER — Inpatient Hospital Stay (HOSPITAL_COMMUNITY): Payer: BLUE CROSS/BLUE SHIELD

## 2015-06-28 ENCOUNTER — Encounter (HOSPITAL_COMMUNITY)
Admission: RE | Disposition: A | Discharge status: Home / Self Care | Payer: Self-pay | Source: Ambulatory Visit | Attending: Thoracic Surgery (Cardiothoracic Vascular Surgery)

## 2015-06-28 ENCOUNTER — Inpatient Hospital Stay (HOSPITAL_COMMUNITY)
Admission: RE | Admit: 2015-06-28 | Discharge: 2015-06-28 | Disposition: A | Discharge status: Home / Self Care | Payer: BLUE CROSS/BLUE SHIELD | Source: Ambulatory Visit | Attending: Thoracic Surgery (Cardiothoracic Vascular Surgery) | Admitting: Thoracic Surgery (Cardiothoracic Vascular Surgery)

## 2015-06-28 ENCOUNTER — Inpatient Hospital Stay (HOSPITAL_COMMUNITY)
Admission: RE | Admit: 2015-06-28 | Discharge: 2015-07-02 | DRG: 221 | Disposition: A | Discharge status: Home / Self Care | Payer: BLUE CROSS/BLUE SHIELD | Source: Ambulatory Visit | Attending: Thoracic Surgery (Cardiothoracic Vascular Surgery) | Admitting: Thoracic Surgery (Cardiothoracic Vascular Surgery)

## 2015-06-28 DIAGNOSIS — R55 Syncope and collapse: Secondary | ICD-10-CM | POA: Diagnosis present

## 2015-06-28 DIAGNOSIS — C50919 Malignant neoplasm of unspecified site of unspecified female breast: Secondary | ICD-10-CM | POA: Diagnosis not present

## 2015-06-28 DIAGNOSIS — I341 Nonrheumatic mitral (valve) prolapse: Secondary | ICD-10-CM | POA: Diagnosis present

## 2015-06-28 DIAGNOSIS — Z880 Allergy status to penicillin: Secondary | ICD-10-CM | POA: Diagnosis not present

## 2015-06-28 DIAGNOSIS — J9811 Atelectasis: Secondary | ICD-10-CM | POA: Diagnosis not present

## 2015-06-28 DIAGNOSIS — Z79899 Other long term (current) drug therapy: Secondary | ICD-10-CM

## 2015-06-28 DIAGNOSIS — I340 Nonrheumatic mitral (valve) insufficiency: Secondary | ICD-10-CM | POA: Diagnosis not present

## 2015-06-28 DIAGNOSIS — Z9889 Other specified postprocedural states: Secondary | ICD-10-CM

## 2015-06-28 DIAGNOSIS — J90 Pleural effusion, not elsewhere classified: Secondary | ICD-10-CM | POA: Diagnosis not present

## 2015-06-28 DIAGNOSIS — M810 Age-related osteoporosis without current pathological fracture: Secondary | ICD-10-CM | POA: Diagnosis present

## 2015-06-28 DIAGNOSIS — R0602 Shortness of breath: Secondary | ICD-10-CM | POA: Diagnosis not present

## 2015-06-28 DIAGNOSIS — Z853 Personal history of malignant neoplasm of breast: Secondary | ICD-10-CM | POA: Diagnosis not present

## 2015-06-28 DIAGNOSIS — I10 Essential (primary) hypertension: Secondary | ICD-10-CM | POA: Diagnosis present

## 2015-06-28 HISTORY — DX: Other specified postprocedural states: Z98.890

## 2015-06-28 HISTORY — PX: TEE WITHOUT CARDIOVERSION: SHX5443

## 2015-06-28 HISTORY — PX: MITRAL VALVE REPAIR: SHX2039

## 2015-06-28 LAB — POCT I-STAT, CHEM 8
BUN: 14 mg/dL (ref 6–20)
BUN: 13 mg/dL (ref 6–20)
BUN: 10 mg/dL (ref 6–20)
BUN: 13 mg/dL (ref 6–20)
BUN: 12 mg/dL (ref 6–20)
BUN: 13 mg/dL (ref 6–20)
BUN: 9 mg/dL (ref 6–20)
CALCIUM ION: 1.27 mmol/L — AB (ref 1.12–1.23)
CALCIUM ION: 1.04 mmol/L — AB (ref 1.12–1.23)
CALCIUM ION: 1.11 mmol/L — AB (ref 1.12–1.23)
CHLORIDE: 100 mmol/L — AB (ref 101–111)
CHLORIDE: 102 mmol/L (ref 101–111)
CREATININE: 0.2 mg/dL — AB (ref 0.44–1.00)
CREATININE: 0.4 mg/dL — AB (ref 0.44–1.00)
CREATININE: 0.4 mg/dL — AB (ref 0.44–1.00)
CREATININE: 0.5 mg/dL (ref 0.44–1.00)
Calcium, Ion: 1.22 mmol/L (ref 1.12–1.23)
Calcium, Ion: 0.92 mmol/L — ABNORMAL LOW (ref 1.12–1.23)
Calcium, Ion: 1.08 mmol/L — ABNORMAL LOW (ref 1.12–1.23)
Calcium, Ion: 1.15 mmol/L (ref 1.12–1.23)
Chloride: 102 mmol/L (ref 101–111)
Chloride: 102 mmol/L (ref 101–111)
Chloride: 98 mmol/L — ABNORMAL LOW (ref 101–111)
Chloride: 101 mmol/L (ref 101–111)
Chloride: 107 mmol/L (ref 101–111)
Creatinine, Ser: 0.5 mg/dL (ref 0.44–1.00)
Creatinine, Ser: 0.4 mg/dL — ABNORMAL LOW (ref 0.44–1.00)
Creatinine, Ser: 0.5 mg/dL (ref 0.44–1.00)
GLUCOSE: 89 mg/dL (ref 65–99)
GLUCOSE: 138 mg/dL — AB (ref 65–99)
GLUCOSE: 125 mg/dL — AB (ref 65–99)
GLUCOSE: 145 mg/dL — AB (ref 65–99)
GLUCOSE: 146 mg/dL — AB (ref 65–99)
Glucose, Bld: 103 mg/dL — ABNORMAL HIGH (ref 65–99)
Glucose, Bld: 107 mg/dL — ABNORMAL HIGH (ref 65–99)
HCT: 32 % — ABNORMAL LOW (ref 36.0–46.0)
HCT: 26 % — ABNORMAL LOW (ref 36.0–46.0)
HCT: 22 % — ABNORMAL LOW (ref 36.0–46.0)
HCT: 22 % — ABNORMAL LOW (ref 36.0–46.0)
HCT: 25 % — ABNORMAL LOW (ref 36.0–46.0)
HCT: 29 % — ABNORMAL LOW (ref 36.0–46.0)
HEMATOCRIT: 31 % — AB (ref 36.0–46.0)
HEMOGLOBIN: 10.9 g/dL — AB (ref 12.0–15.0)
HEMOGLOBIN: 7.5 g/dL — AB (ref 12.0–15.0)
HEMOGLOBIN: 9.9 g/dL — AB (ref 12.0–15.0)
Hemoglobin: 10.5 g/dL — ABNORMAL LOW (ref 12.0–15.0)
Hemoglobin: 8.8 g/dL — ABNORMAL LOW (ref 12.0–15.0)
Hemoglobin: 7.5 g/dL — ABNORMAL LOW (ref 12.0–15.0)
Hemoglobin: 8.5 g/dL — ABNORMAL LOW (ref 12.0–15.0)
POTASSIUM: 4.2 mmol/L (ref 3.5–5.1)
POTASSIUM: 4.2 mmol/L (ref 3.5–5.1)
Potassium: 3.9 mmol/L (ref 3.5–5.1)
Potassium: 3.9 mmol/L (ref 3.5–5.1)
Potassium: 3.9 mmol/L (ref 3.5–5.1)
Potassium: 4.2 mmol/L (ref 3.5–5.1)
Potassium: 5.2 mmol/L — ABNORMAL HIGH (ref 3.5–5.1)
SODIUM: 140 mmol/L (ref 135–145)
SODIUM: 139 mmol/L (ref 135–145)
SODIUM: 139 mmol/L (ref 135–145)
Sodium: 139 mmol/L (ref 135–145)
Sodium: 132 mmol/L — ABNORMAL LOW (ref 135–145)
Sodium: 136 mmol/L (ref 135–145)
Sodium: 140 mmol/L (ref 135–145)
TCO2: 28 mmol/L (ref 0–100)
TCO2: 28 mmol/L (ref 0–100)
TCO2: 26 mmol/L (ref 0–100)
TCO2: 28 mmol/L (ref 0–100)
TCO2: 23 mmol/L (ref 0–100)
TCO2: 28 mmol/L (ref 0–100)
TCO2: 22 mmol/L (ref 0–100)

## 2015-06-28 LAB — GLUCOSE, CAPILLARY
GLUCOSE-CAPILLARY: 100 mg/dL — AB (ref 65–99)
GLUCOSE-CAPILLARY: 114 mg/dL — AB (ref 65–99)
Glucose-Capillary: 130 mg/dL — ABNORMAL HIGH (ref 65–99)

## 2015-06-28 LAB — CBC
HCT: 30.3 % — ABNORMAL LOW (ref 36.0–46.0)
HEMATOCRIT: 29.2 % — AB (ref 36.0–46.0)
HEMOGLOBIN: 9.7 g/dL — AB (ref 12.0–15.0)
HEMOGLOBIN: 10.1 g/dL — AB (ref 12.0–15.0)
MCH: 28.5 pg (ref 26.0–34.0)
MCH: 28.9 pg (ref 26.0–34.0)
MCHC: 33.2 g/dL (ref 30.0–36.0)
MCHC: 33.3 g/dL (ref 30.0–36.0)
MCV: 85.9 fL (ref 78.0–100.0)
MCV: 86.6 fL (ref 78.0–100.0)
Platelets: 160 10*3/uL (ref 150–400)
Platelets: 177 10*3/uL (ref 150–400)
RBC: 3.4 MIL/uL — ABNORMAL LOW (ref 3.87–5.11)
RBC: 3.5 MIL/uL — AB (ref 3.87–5.11)
RDW: 14.1 % (ref 11.5–15.5)
RDW: 14.2 % (ref 11.5–15.5)
WBC: 11.7 10*3/uL — AB (ref 4.0–10.5)
WBC: 15.3 10*3/uL — ABNORMAL HIGH (ref 4.0–10.5)

## 2015-06-28 LAB — POCT I-STAT 3, ART BLOOD GAS (G3+)
ACID-BASE DEFICIT: 2 mmol/L (ref 0.0–2.0)
ACID-BASE DEFICIT: 2 mmol/L (ref 0.0–2.0)
ACID-BASE DEFICIT: 4 mmol/L — AB (ref 0.0–2.0)
ACID-BASE EXCESS: 2 mmol/L (ref 0.0–2.0)
Acid-base deficit: 5 mmol/L — ABNORMAL HIGH (ref 0.0–2.0)
Bicarbonate: 27.8 mEq/L — ABNORMAL HIGH (ref 20.0–24.0)
Bicarbonate: 26.1 mEq/L — ABNORMAL HIGH (ref 20.0–24.0)
Bicarbonate: 23.8 mEq/L (ref 20.0–24.0)
Bicarbonate: 23.1 mEq/L (ref 20.0–24.0)
Bicarbonate: 22 mEq/L (ref 20.0–24.0)
Bicarbonate: 22.1 mEq/L (ref 20.0–24.0)
O2 SAT: 100 %
O2 SAT: 96 %
O2 SAT: 95 %
O2 SAT: 97 %
O2 Saturation: 100 %
O2 Saturation: 96 %
PCO2 ART: 60.7 mmHg — AB (ref 35.0–45.0)
PCO2 ART: 42.5 mmHg (ref 35.0–45.0)
PH ART: 7.268 — AB (ref 7.350–7.450)
PH ART: 7.442 (ref 7.350–7.450)
PO2 ART: 71 mmHg — AB (ref 80.0–100.0)
PO2 ART: 100 mmHg (ref 80.0–100.0)
Patient temperature: 35.6
Patient temperature: 37.1
TCO2: 30 mmol/L (ref 0–100)
TCO2: 27 mmol/L (ref 0–100)
TCO2: 25 mmol/L (ref 0–100)
TCO2: 24 mmol/L (ref 0–100)
TCO2: 23 mmol/L (ref 0–100)
TCO2: 23 mmol/L (ref 0–100)
pCO2 arterial: 38.2 mmHg (ref 35.0–45.0)
pCO2 arterial: 39.1 mmHg (ref 35.0–45.0)
pCO2 arterial: 44.8 mmHg (ref 35.0–45.0)
pCO2 arterial: 46.7 mmHg — ABNORMAL HIGH (ref 35.0–45.0)
pH, Arterial: 7.357 (ref 7.350–7.450)
pH, Arterial: 7.372 (ref 7.350–7.450)
pH, Arterial: 7.3 — ABNORMAL LOW (ref 7.350–7.450)
pH, Arterial: 7.282 — ABNORMAL LOW (ref 7.350–7.450)
pO2, Arterial: 377 mmHg — ABNORMAL HIGH (ref 80.0–100.0)
pO2, Arterial: 468 mmHg — ABNORMAL HIGH (ref 80.0–100.0)
pO2, Arterial: 86 mmHg (ref 80.0–100.0)
pO2, Arterial: 89 mmHg (ref 80.0–100.0)

## 2015-06-28 LAB — PLATELET COUNT: PLATELETS: 168 10*3/uL (ref 150–400)

## 2015-06-28 LAB — POCT I-STAT 4, (NA,K, GLUC, HGB,HCT)
Glucose, Bld: 104 mg/dL — ABNORMAL HIGH (ref 65–99)
HCT: 29 % — ABNORMAL LOW (ref 36.0–46.0)
Hemoglobin: 9.9 g/dL — ABNORMAL LOW (ref 12.0–15.0)
POTASSIUM: 3.6 mmol/L (ref 3.5–5.1)
SODIUM: 140 mmol/L (ref 135–145)

## 2015-06-28 LAB — CREATININE, SERUM
Creatinine, Ser: 0.64 mg/dL (ref 0.44–1.00)
GFR calc non Af Amer: >60 mL/min (ref >60)

## 2015-06-28 LAB — HEMOGLOBIN AND HEMATOCRIT, BLOOD
HEMATOCRIT: 22.7 % — AB (ref 36.0–46.0)
HEMOGLOBIN: 7.7 g/dL — AB (ref 12.0–15.0)

## 2015-06-28 LAB — APTT: APTT: 33 s (ref 24–37)

## 2015-06-28 LAB — PREPARE RBC (CROSSMATCH)

## 2015-06-28 LAB — MAGNESIUM: Magnesium: 3.1 mg/dL — ABNORMAL HIGH (ref 1.7–2.4)

## 2015-06-28 LAB — PROTIME-INR
INR: 1.46 (ref 0.00–1.49)
PROTHROMBIN TIME: 17.8 s — AB (ref 11.6–15.2)

## 2015-06-28 SURGERY — REPAIR, MITRAL VALVE, MINIMALLY INVASIVE
Anesthesia: General | Site: Chest | Laterality: Right

## 2015-06-28 MED ORDER — METOPROLOL TARTRATE 1 MG/ML IV SOLN: 2.5000 mg | INTRAVENOUS | Status: DC | PRN

## 2015-06-28 MED ORDER — ASPIRIN EC 325 MG PO TBEC
325.0000 mg | DELAYED_RELEASE_TABLET | Freq: Every day | ORAL | Status: DC
  Administered 2015-06-29: 325 mg via ORAL
  Filled 2015-06-28: qty 1

## 2015-06-28 MED ORDER — LACTATED RINGERS IV SOLN: INTRAVENOUS | Status: DC

## 2015-06-28 MED ORDER — NITROGLYCERIN IN D5W 200-5 MCG/ML-% IV SOLN
2.0000 ug/min | INTRAVENOUS | Status: DC
  Filled 2015-06-28: qty 250

## 2015-06-28 MED ORDER — METOPROLOL TARTRATE 25 MG/10 ML ORAL SUSPENSION: 12.5000 mg | Freq: Two times a day (BID) | ORAL | Status: DC

## 2015-06-28 MED ORDER — EPINEPHRINE HCL 1 MG/ML IJ SOLN
0.0000 ug/min | INTRAMUSCULAR | Status: DC
  Filled 2015-06-28: qty 4

## 2015-06-28 MED ORDER — MAGNESIUM SULFATE 4 GM/100ML IV SOLN
4.0000 g | Freq: Once | INTRAVENOUS | Status: AC
  Administered 2015-06-28: 4 g via INTRAVENOUS
  Filled 2015-06-28: qty 100

## 2015-06-28 MED ORDER — PROPOFOL 10 MG/ML IV BOLUS
INTRAVENOUS | Status: AC
  Filled 2015-06-28: qty 20

## 2015-06-28 MED ORDER — PHENYLEPHRINE HCL 10 MG/ML IJ SOLN
0.0000 ug/min | INTRAVENOUS | Status: DC
  Filled 2015-06-28: qty 2

## 2015-06-28 MED ORDER — SODIUM CHLORIDE 0.9 % IV SOLN
1250.0000 mg | INTRAVENOUS | Status: AC
  Administered 2015-06-28: 1250 mg via INTRAVENOUS
  Filled 2015-06-28: qty 1250

## 2015-06-28 MED ORDER — SODIUM CHLORIDE 0.45 % IV SOLN
INTRAVENOUS | Status: DC | PRN
  Administered 2015-06-28: 20 mL/h via INTRAVENOUS

## 2015-06-28 MED ORDER — 0.9 % SODIUM CHLORIDE (POUR BTL) OPTIME
TOPICAL | Status: DC | PRN
  Administered 2015-06-28: 6000 mL

## 2015-06-28 MED ORDER — BUPIVACAINE HCL (PF) 0.5 % IJ SOLN
INTRAMUSCULAR | Status: AC
  Filled 2015-06-28: qty 10

## 2015-06-28 MED ORDER — CHLORHEXIDINE GLUCONATE 0.12 % MT SOLN
OROMUCOSAL | Status: AC
  Administered 2015-06-28: 15 mL via OROMUCOSAL
  Filled 2015-06-28: qty 15

## 2015-06-28 MED ORDER — EPHEDRINE SULFATE 50 MG/ML IJ SOLN
INTRAMUSCULAR | Status: DC | PRN
  Administered 2015-06-28: 5 mg via INTRAVENOUS

## 2015-06-28 MED ORDER — MAGNESIUM SULFATE 50 % IJ SOLN
40.0000 meq | INTRAMUSCULAR | Status: DC
  Filled 2015-06-28: qty 10

## 2015-06-28 MED ORDER — LEVOFLOXACIN IN D5W 500 MG/100ML IV SOLN
500.0000 mg | INTRAVENOUS | Status: AC
  Administered 2015-06-28: 500 mg via INTRAVENOUS
  Filled 2015-06-28: qty 100

## 2015-06-28 MED ORDER — FENTANYL CITRATE (PF) 250 MCG/5ML IJ SOLN
INTRAMUSCULAR | Status: AC
Start: 2015-06-28 — End: 2015-06-28
  Filled 2015-06-28: qty 25

## 2015-06-28 MED ORDER — NITROGLYCERIN IN D5W 200-5 MCG/ML-% IV SOLN: 0.0000 ug/min | INTRAVENOUS | Status: DC

## 2015-06-28 MED ORDER — DEXTROSE 5 % IV SOLN
1.5000 g | Freq: Two times a day (BID) | INTRAVENOUS | Status: AC
  Administered 2015-06-28 – 2015-06-30 (×4): 1.5 g via INTRAVENOUS
  Filled 2015-06-28 (×4): qty 1.5

## 2015-06-28 MED ORDER — DOCUSATE SODIUM 100 MG PO CAPS
200.0000 mg | ORAL_CAPSULE | Freq: Every day | ORAL | Status: DC
  Administered 2015-06-29 – 2015-07-02 (×3): 200 mg via ORAL
  Filled 2015-06-28 (×3): qty 2

## 2015-06-28 MED ORDER — OXYCODONE HCL 5 MG PO TABS: 5.0000 mg | ORAL_TABLET | ORAL | Status: DC | PRN

## 2015-06-28 MED ORDER — BUPIVACAINE 0.5 % ON-Q PUMP SINGLE CATH 400 ML
400.0000 mL | INJECTION | Status: DC
  Filled 2015-06-28: qty 400

## 2015-06-28 MED ORDER — CHLORHEXIDINE GLUCONATE 0.12 % MT SOLN
15.0000 mL | OROMUCOSAL | Status: AC
  Administered 2015-06-28: 15 mL via OROMUCOSAL

## 2015-06-28 MED ORDER — ONDANSETRON HCL 4 MG/2ML IJ SOLN
4.0000 mg | Freq: Four times a day (QID) | INTRAMUSCULAR | Status: DC | PRN
  Administered 2015-06-30 (×2): 4 mg via INTRAVENOUS
  Filled 2015-06-28 (×2): qty 2

## 2015-06-28 MED ORDER — BISACODYL 10 MG RE SUPP: 10.0000 mg | Freq: Every day | RECTAL | Status: DC

## 2015-06-28 MED ORDER — GLUTARALDEHYDE 0.625% SOAKING SOLUTION
TOPICAL | Status: DC | PRN
  Filled 2015-06-28: qty 50

## 2015-06-28 MED ORDER — PROTAMINE SULFATE 10 MG/ML IV SOLN
INTRAVENOUS | Status: DC | PRN
  Administered 2015-06-28: 10 mg via INTRAVENOUS
  Administered 2015-06-28: 230 mg via INTRAVENOUS

## 2015-06-28 MED ORDER — SODIUM CHLORIDE 0.9% FLUSH: 3.0000 mL | INTRAVENOUS | Status: DC | PRN

## 2015-06-28 MED ORDER — POTASSIUM CHLORIDE 10 MEQ/50ML IV SOLN
10.0000 meq | INTRAVENOUS | Status: AC
  Administered 2015-06-28 (×3): 10 meq via INTRAVENOUS

## 2015-06-28 MED ORDER — ALBUMIN HUMAN 5 % IV SOLN: 250.0000 mL | INTRAVENOUS | Status: AC | PRN

## 2015-06-28 MED ORDER — ACETAMINOPHEN 500 MG PO TABS
1000.0000 mg | ORAL_TABLET | Freq: Four times a day (QID) | ORAL | Status: DC
  Administered 2015-06-29 – 2015-07-02 (×12): 1000 mg via ORAL
  Filled 2015-06-28 (×12): qty 2

## 2015-06-28 MED ORDER — ACETAMINOPHEN 160 MG/5ML PO SOLN
1000.0000 mg | Freq: Four times a day (QID) | ORAL | Status: DC
  Administered 2015-06-29: 1000 mg

## 2015-06-28 MED ORDER — CHLORHEXIDINE GLUCONATE 0.12% ORAL RINSE (MEDLINE KIT)
15.0000 mL | Freq: Two times a day (BID) | OROMUCOSAL | Status: DC
  Administered 2015-06-28: 15 mL via OROMUCOSAL

## 2015-06-28 MED ORDER — PLASMA-LYTE 148 IV SOLN
INTRAVENOUS | Status: AC
  Administered 2015-06-28: 500 mL
  Filled 2015-06-28: qty 2.5

## 2015-06-28 MED ORDER — POTASSIUM CHLORIDE 2 MEQ/ML IV SOLN
80.0000 meq | INTRAVENOUS | Status: DC
  Filled 2015-06-28: qty 40

## 2015-06-28 MED ORDER — SODIUM CHLORIDE 0.9 % IV SOLN
INTRAVENOUS | Status: AC
  Administered 2015-06-28: 1 [IU]/h via INTRAVENOUS
  Filled 2015-06-28: qty 2.5

## 2015-06-28 MED ORDER — VANCOMYCIN HCL IN DEXTROSE 1-5 GM/200ML-% IV SOLN
1000.0000 mg | Freq: Once | INTRAVENOUS | Status: AC
  Administered 2015-06-28: 1000 mg via INTRAVENOUS
  Filled 2015-06-28: qty 200

## 2015-06-28 MED ORDER — ROCURONIUM BROMIDE 100 MG/10ML IV SOLN
INTRAVENOUS | Status: DC | PRN
  Administered 2015-06-28: 50 mg via INTRAVENOUS

## 2015-06-28 MED ORDER — DOPAMINE-DEXTROSE 3.2-5 MG/ML-% IV SOLN
0.0000 ug/kg/min | INTRAVENOUS | Status: DC
  Filled 2015-06-28: qty 250

## 2015-06-28 MED ORDER — SODIUM CHLORIDE 0.9 % IV SOLN
INTRAVENOUS | Status: DC
  Administered 2015-06-28: 10 mL/h via INTRAVENOUS

## 2015-06-28 MED ORDER — LACTATED RINGERS IV SOLN: 500.0000 mL | Freq: Once | INTRAVENOUS | Status: DC | PRN

## 2015-06-28 MED ORDER — ANTISEPTIC ORAL RINSE SOLUTION (CORINZ)
7.0000 mL | Freq: Four times a day (QID) | OROMUCOSAL | Status: DC
  Administered 2015-06-29 (×2): 7 mL via OROMUCOSAL

## 2015-06-28 MED ORDER — SODIUM CHLORIDE 0.9 % IV SOLN
INTRAVENOUS | Status: AC
  Administered 2015-06-28: 69.8 mL/h via INTRAVENOUS
  Filled 2015-06-28: qty 40

## 2015-06-28 MED ORDER — LACTATED RINGERS IV SOLN
INTRAVENOUS | Status: DC | PRN
  Administered 2015-06-28: 07:00:00 via INTRAVENOUS

## 2015-06-28 MED ORDER — CHLORHEXIDINE GLUCONATE 0.12 % MT SOLN
15.0000 mL | Freq: Once | OROMUCOSAL | Status: AC
  Administered 2015-06-28: 15 mL via OROMUCOSAL

## 2015-06-28 MED ORDER — TRAMADOL HCL 50 MG PO TABS
50.0000 mg | ORAL_TABLET | ORAL | Status: DC | PRN
  Administered 2015-06-29 – 2015-07-02 (×2): 50 mg via ORAL
  Filled 2015-06-28 (×2): qty 1

## 2015-06-28 MED ORDER — MORPHINE SULFATE (PF) 2 MG/ML IV SOLN: 1.0000 mg | INTRAVENOUS | Status: DC | PRN

## 2015-06-28 MED ORDER — SODIUM CHLORIDE 0.9 % IV SOLN
INTRAVENOUS | Status: DC
  Filled 2015-06-28: qty 30

## 2015-06-28 MED ORDER — ALBUMIN HUMAN 5 % IV SOLN
INTRAVENOUS | Status: DC | PRN
  Administered 2015-06-28: 13:00:00 via INTRAVENOUS

## 2015-06-28 MED ORDER — ACETAMINOPHEN 650 MG RE SUPP
650.0000 mg | Freq: Once | RECTAL | Status: AC
  Administered 2015-06-28: 650 mg via RECTAL

## 2015-06-28 MED ORDER — PANTOPRAZOLE SODIUM 40 MG PO TBEC
40.0000 mg | DELAYED_RELEASE_TABLET | Freq: Every day | ORAL | Status: DC
  Administered 2015-06-30 – 2015-07-02 (×3): 40 mg via ORAL
  Filled 2015-06-28 (×3): qty 1

## 2015-06-28 MED ORDER — VANCOMYCIN HCL 1000 MG IV SOLR
INTRAVENOUS | Status: AC
  Administered 2015-06-28: 1000 mL
  Filled 2015-06-28: qty 1000

## 2015-06-28 MED ORDER — MIDAZOLAM HCL 2 MG/2ML IJ SOLN
INTRAMUSCULAR | Status: AC
  Filled 2015-06-28: qty 2

## 2015-06-28 MED ORDER — METOPROLOL TARTRATE 12.5 MG HALF TABLET
12.5000 mg | ORAL_TABLET | Freq: Once | ORAL | Status: AC
Start: 2015-06-28 — End: 2015-06-28
  Administered 2015-06-28: 12.5 mg via ORAL

## 2015-06-28 MED ORDER — SODIUM CHLORIDE 0.9 % IV SOLN
INTRAVENOUS | Status: DC
  Filled 2015-06-28: qty 2.5

## 2015-06-28 MED ORDER — ACETAMINOPHEN 160 MG/5ML PO SOLN: 650.0000 mg | Freq: Once | ORAL | Status: AC

## 2015-06-28 MED ORDER — MORPHINE SULFATE (PF) 2 MG/ML IV SOLN: 2.0000 mg | INTRAVENOUS | Status: DC | PRN

## 2015-06-28 MED ORDER — PHENYLEPHRINE HCL 10 MG/ML IJ SOLN
10.0000 mg | INTRAMUSCULAR | Status: DC | PRN
  Administered 2015-06-28: 15 ug/min via INTRAVENOUS

## 2015-06-28 MED ORDER — PHENYLEPHRINE HCL 10 MG/ML IJ SOLN
30.0000 ug/min | INTRAVENOUS | Status: DC
  Filled 2015-06-28: qty 2

## 2015-06-28 MED ORDER — FAMOTIDINE IN NACL 20-0.9 MG/50ML-% IV SOLN
20.0000 mg | Freq: Two times a day (BID) | INTRAVENOUS | Status: AC
  Administered 2015-06-28 (×2): 20 mg via INTRAVENOUS
  Filled 2015-06-28: qty 50

## 2015-06-28 MED ORDER — SODIUM CHLORIDE 0.9 % IV SOLN
INTRAVENOUS | Status: DC
  Administered 2015-06-28: 100 mL/h via INTRAVENOUS

## 2015-06-28 MED ORDER — INSULIN REGULAR BOLUS VIA INFUSION
0.0000 [IU] | Freq: Three times a day (TID) | INTRAVENOUS | Status: DC
  Filled 2015-06-28: qty 10

## 2015-06-28 MED ORDER — SODIUM CHLORIDE 0.9 % IV SOLN: 250.0000 mL | INTRAVENOUS | Status: DC

## 2015-06-28 MED ORDER — BUPIVACAINE HCL (PF) 0.5 % IJ SOLN
INTRAMUSCULAR | Status: DC | PRN
  Administered 2015-06-28: 10 mL

## 2015-06-28 MED ORDER — INSULIN ASPART 100 UNIT/ML ~~LOC~~ SOLN
0.0000 [IU] | SUBCUTANEOUS | Status: DC
  Administered 2015-06-28: 2 [IU] via SUBCUTANEOUS

## 2015-06-28 MED ORDER — BISACODYL 5 MG PO TBEC
10.0000 mg | DELAYED_RELEASE_TABLET | Freq: Every day | ORAL | Status: DC
Start: 2015-06-29 — End: 2015-07-02
  Administered 2015-06-29 – 2015-06-30 (×2): 10 mg via ORAL
  Filled 2015-06-28 (×2): qty 2

## 2015-06-28 MED ORDER — ASPIRIN 81 MG PO CHEW
324.0000 mg | CHEWABLE_TABLET | Freq: Every day | ORAL | Status: DC
Start: 2015-06-29 — End: 2015-06-29

## 2015-06-28 MED ORDER — MIDAZOLAM HCL 2 MG/2ML IJ SOLN: 2.0000 mg | INTRAMUSCULAR | Status: DC | PRN

## 2015-06-28 MED ORDER — LACTATED RINGERS IV SOLN
INTRAVENOUS | Status: DC | PRN
  Administered 2015-06-28 (×2): via INTRAVENOUS

## 2015-06-28 MED ORDER — METOPROLOL TARTRATE 12.5 MG HALF TABLET
ORAL_TABLET | ORAL | Status: AC
  Administered 2015-06-28: 12.5 mg via ORAL
  Filled 2015-06-28: qty 1

## 2015-06-28 MED ORDER — SODIUM CHLORIDE 0.9% FLUSH
3.0000 mL | Freq: Two times a day (BID) | INTRAVENOUS | Status: DC
  Administered 2015-06-29 – 2015-07-01 (×4): 3 mL via INTRAVENOUS

## 2015-06-28 MED ORDER — LIDOCAINE HCL (CARDIAC) 20 MG/ML IV SOLN
INTRAVENOUS | Status: DC | PRN
  Administered 2015-06-28: 60 mg via INTRAVENOUS

## 2015-06-28 MED ORDER — FENTANYL CITRATE (PF) 250 MCG/5ML IJ SOLN
INTRAMUSCULAR | Status: AC
  Filled 2015-06-28: qty 10

## 2015-06-28 MED ORDER — DEXMEDETOMIDINE HCL IN NACL 200 MCG/50ML IV SOLN
0.0000 ug/kg/h | INTRAVENOUS | Status: DC
  Filled 2015-06-28: qty 50

## 2015-06-28 MED ORDER — MIDAZOLAM HCL 10 MG/2ML IJ SOLN
INTRAMUSCULAR | Status: AC
  Filled 2015-06-28: qty 2

## 2015-06-28 MED ORDER — HEPARIN SODIUM (PORCINE) 1000 UNIT/ML IJ SOLN
INTRAMUSCULAR | Status: DC | PRN
  Administered 2015-06-28: 24000 [IU] via INTRAVENOUS

## 2015-06-28 MED ORDER — METOPROLOL TARTRATE 12.5 MG HALF TABLET
12.5000 mg | ORAL_TABLET | Freq: Two times a day (BID) | ORAL | Status: DC
  Administered 2015-06-29 – 2015-07-02 (×6): 12.5 mg via ORAL
  Filled 2015-06-28 (×6): qty 1

## 2015-06-28 MED ORDER — DEXMEDETOMIDINE HCL IN NACL 400 MCG/100ML IV SOLN
0.1000 ug/kg/h | INTRAVENOUS | Status: AC
  Administered 2015-06-28: .3 ug/kg/h via INTRAVENOUS
  Filled 2015-06-28: qty 100

## 2015-06-28 MED ORDER — SODIUM CHLORIDE 0.9 % IR SOLN
Status: DC | PRN
  Administered 2015-06-28: 3000 mL
  Administered 2015-06-28: 1000 mL

## 2015-06-28 MED ORDER — VECURONIUM BROMIDE 10 MG IV SOLR
INTRAVENOUS | Status: DC | PRN
  Administered 2015-06-28 (×2): 5 mg via INTRAVENOUS
  Administered 2015-06-28: 2 mg via INTRAVENOUS
  Administered 2015-06-28: 5 mg via INTRAVENOUS

## 2015-06-28 MED ORDER — MIDAZOLAM HCL 5 MG/5ML IJ SOLN
INTRAMUSCULAR | Status: DC | PRN
  Administered 2015-06-28: 3 mg via INTRAVENOUS
  Administered 2015-06-28: 1 mg via INTRAVENOUS
  Administered 2015-06-28: 2 mg via INTRAVENOUS
  Administered 2015-06-28 (×2): 1 mg via INTRAVENOUS
  Administered 2015-06-28: 3 mg via INTRAVENOUS
  Administered 2015-06-28: 1 mg via INTRAVENOUS

## 2015-06-28 MED ORDER — FENTANYL CITRATE (PF) 100 MCG/2ML IJ SOLN
INTRAMUSCULAR | Status: DC | PRN
  Administered 2015-06-28: 50 ug via INTRAVENOUS
  Administered 2015-06-28: 100 ug via INTRAVENOUS
  Administered 2015-06-28 (×2): 50 ug via INTRAVENOUS
  Administered 2015-06-28 (×2): 150 ug via INTRAVENOUS
  Administered 2015-06-28: 600 ug via INTRAVENOUS
  Administered 2015-06-28 (×2): 150 ug via INTRAVENOUS
  Administered 2015-06-28 (×3): 100 ug via INTRAVENOUS

## 2015-06-28 MED FILL — Potassium Chloride Inj 2 mEq/ML: INTRAVENOUS | Qty: 40 | Status: AC

## 2015-06-28 MED FILL — Magnesium Sulfate Inj 50%: INTRAMUSCULAR | Qty: 2 | Status: AC

## 2015-06-28 MED FILL — Heparin Sodium (Porcine) Inj 1000 Unit/ML: INTRAMUSCULAR | Qty: 30 | Status: AC

## 2015-06-28 SURGICAL SUPPLY — 115 items
ADAPTER CARDIO PERF ANTE/RETRO (ADAPTER) ×3 IMPLANT
ADH SKN CLS APL DERMABOND .7 (GAUZE/BANDAGES/DRESSINGS) ×2
ADPR PRFSN 84XANTGRD RTRGD (ADAPTER) ×2
BAG DECANTER FOR FLEXI CONT (MISCELLANEOUS) ×6 IMPLANT
BLADE STERNUM SYSTEM 6 (BLADE) ×1 IMPLANT
BLADE SURG 11 STRL SS (BLADE) ×3 IMPLANT
CANISTER SUCTION 2500CC (MISCELLANEOUS) ×6 IMPLANT
CANNULA FEM VENOUS REMOTE 22FR (CANNULA) ×1 IMPLANT
CANNULA FEMORAL ART 14 SM (MISCELLANEOUS) ×3 IMPLANT
CANNULA GUNDRY RCSP 15FR (MISCELLANEOUS) ×3 IMPLANT
CANNULA OPTISITE PERFUSION 16F (CANNULA) IMPLANT
CANNULA OPTISITE PERFUSION 18F (CANNULA) ×1 IMPLANT
CANNULA SUMP PERICARDIAL (CANNULA) ×6 IMPLANT
CATH KIT ON Q 5IN SLV (PAIN MANAGEMENT) ×1 IMPLANT
CONN ST 1/4X3/8  BEN (MISCELLANEOUS) ×2
CONN ST 1/4X3/8 BEN (MISCELLANEOUS) ×4 IMPLANT
CONNECTOR 1/2X3/8X1/2 3 WAY (MISCELLANEOUS) ×1
CONNECTOR 1/2X3/8X1/2 3WAY (MISCELLANEOUS) ×2 IMPLANT
CONT SPEC 4OZ CLIKSEAL STRL BL (MISCELLANEOUS) ×1 IMPLANT
CONT SPEC STER OR (MISCELLANEOUS) ×3 IMPLANT
COVER BACK TABLE 24X17X13 BIG (DRAPES) ×3 IMPLANT
CRADLE DONUT ADULT HEAD (MISCELLANEOUS) ×3 IMPLANT
DERMABOND ADVANCED (GAUZE/BANDAGES/DRESSINGS) ×1
DERMABOND ADVANCED .7 DNX12 (GAUZE/BANDAGES/DRESSINGS) ×4 IMPLANT
DEVICE PMI PUNCTURE CLOSURE (MISCELLANEOUS) ×3 IMPLANT
DEVICE SUT CK QUICK LOAD MINI (Prosthesis & Implant Heart) ×2 IMPLANT
DEVICE TROCAR PUNCTURE CLOSURE (ENDOMECHANICALS) ×3 IMPLANT
DRAIN CHANNEL 28F RND 3/8 FF (WOUND CARE) ×6 IMPLANT
DRAPE BILATERAL SPLIT (DRAPES) ×3 IMPLANT
DRAPE C-ARM 42X72 X-RAY (DRAPES) ×3 IMPLANT
DRAPE CV SPLIT W-CLR ANES SCRN (DRAPES) ×3 IMPLANT
DRAPE INCISE IOBAN 66X45 STRL (DRAPES) ×9 IMPLANT
DRAPE SLUSH/WARMER DISC (DRAPES) ×3 IMPLANT
DRSG COVADERM 4X8 (GAUZE/BANDAGES/DRESSINGS) ×3 IMPLANT
ELECT BLADE 6.5 EXT (BLADE) ×3 IMPLANT
ELECT REM PT RETURN 9FT ADLT (ELECTROSURGICAL) ×6
ELECTRODE REM PT RTRN 9FT ADLT (ELECTROSURGICAL) ×4 IMPLANT
FELT TEFLON 1X6 (MISCELLANEOUS) ×3 IMPLANT
FEMORAL VENOUS CANN RAP (CANNULA) IMPLANT
GAUZE SPONGE 4X4 12PLY STRL (GAUZE/BANDAGES/DRESSINGS) ×1 IMPLANT
GLOVE ORTHO TXT STRL SZ7.5 (GLOVE) ×9 IMPLANT
GOWN STRL REUS W/ TWL LRG LVL3 (GOWN DISPOSABLE) ×8 IMPLANT
GOWN STRL REUS W/TWL LRG LVL3 (GOWN DISPOSABLE) ×18
KIT BASIN OR (CUSTOM PROCEDURE TRAY) ×3 IMPLANT
KIT DILATOR VASC 18G NDL (KITS) ×3 IMPLANT
KIT DRAINAGE VACCUM ASSIST (KITS) ×1 IMPLANT
KIT ROOM TURNOVER OR (KITS) ×3 IMPLANT
KIT SUCTION CATH 14FR (SUCTIONS) ×3 IMPLANT
KIT SUT CK MINI COMBO 4X17 (Prosthesis & Implant Heart) ×1 IMPLANT
LEAD PACING MYOCARDI (MISCELLANEOUS) ×3 IMPLANT
LINE VENT (MISCELLANEOUS) ×1 IMPLANT
NDL AORTIC ROOT 14G 7F (CATHETERS) ×2 IMPLANT
NEEDLE AORTIC ROOT 14G 7F (CATHETERS) ×3 IMPLANT
NS IRRIG 1000ML POUR BTL (IV SOLUTION) ×16 IMPLANT
PACK OPEN HEART (CUSTOM PROCEDURE TRAY) ×3 IMPLANT
PAD ARMBOARD 7.5X6 YLW CONV (MISCELLANEOUS) ×6 IMPLANT
PAD ELECT DEFIB RADIOL ZOLL (MISCELLANEOUS) ×3 IMPLANT
PATCH CORMATRIX 4CMX7CM (Prosthesis & Implant Heart) ×1 IMPLANT
RETRACTOR TRL SOFT TISSUE LG (INSTRUMENTS) IMPLANT
RETRACTOR TRM SOFT TISSUE 7.5 (INSTRUMENTS) IMPLANT
RING MITRAL MEMO 3D 32MM SMD32 (Prosthesis & Implant Heart) ×1 IMPLANT
SET CANNULATION TOURNIQUET (MISCELLANEOUS) ×3 IMPLANT
SET CARDIOPLEGIA MPS 5001102 (MISCELLANEOUS) ×1 IMPLANT
SET IRRIG TUBING LAPAROSCOPIC (IRRIGATION / IRRIGATOR) ×3 IMPLANT
SOLUTION ANTI FOG 6CC (MISCELLANEOUS) ×3 IMPLANT
SPONGE GAUZE 4X4 12PLY STER LF (GAUZE/BANDAGES/DRESSINGS) ×3 IMPLANT
SUT BONE WAX W31G (SUTURE) ×3 IMPLANT
SUT E-PACK MINIMALLY INVASIVE (SUTURE) ×2 IMPLANT
SUT ETHIBOND (SUTURE) ×2 IMPLANT
SUT ETHIBOND 2 0 SH (SUTURE) ×1 IMPLANT
SUT ETHIBOND 2-0 RB-1 WHT (SUTURE) ×2 IMPLANT
SUT ETHIBOND NAB MH 2-0 36IN (SUTURE) ×1 IMPLANT
SUT ETHIBOND X763 2 0 SH 1 (SUTURE) ×3 IMPLANT
SUT GORETEX CV 4 TH 22 36 (SUTURE) ×3 IMPLANT
SUT GORETEX CV-5THC-13 36IN (SUTURE) ×12 IMPLANT
SUT GORETEX CV4 TH-18 (SUTURE) ×6 IMPLANT
SUT MNCRL AB 3-0 PS2 18 (SUTURE) ×2 IMPLANT
SUT PROLENE 3 0 SH 1 (SUTURE) ×4 IMPLANT
SUT PROLENE 3 0 SH DA (SUTURE) ×4 IMPLANT
SUT PROLENE 3 0 SH1 36 (SUTURE) ×12 IMPLANT
SUT PROLENE 4 0 RB 1 (SUTURE) ×9
SUT PROLENE 4-0 RB1 .5 CRCL 36 (SUTURE) IMPLANT
SUT PROLENE 5 0 C 1 36 (SUTURE) ×2 IMPLANT
SUT PROLENE 6 0 C 1 30 (SUTURE) ×2 IMPLANT
SUT PTFE CHORD X 24MM (SUTURE) ×1 IMPLANT
SUT PTFE CHORD X SYSTEM (SUTURE) ×1 IMPLANT
SUT SILK  1 MH (SUTURE) ×9
SUT SILK 1 MH (SUTURE) IMPLANT
SUT SILK 1 TIES 10X30 (SUTURE) ×1 IMPLANT
SUT SILK 2 0 SH CR/8 (SUTURE) ×1 IMPLANT
SUT SILK 2 0 TIES 10X30 (SUTURE) ×1 IMPLANT
SUT SILK 2 0SH CR/8 30 (SUTURE) ×2 IMPLANT
SUT SILK 3 0 (SUTURE) ×3
SUT SILK 3 0 SH CR/8 (SUTURE) ×1 IMPLANT
SUT SILK 3 0SH CR/8 30 (SUTURE) ×1 IMPLANT
SUT SILK 3-0 18XBRD TIE 12 (SUTURE) IMPLANT
SUT TEM PAC WIRE 2 0 SH (SUTURE) ×2 IMPLANT
SUT VIC AB 2-0 CTX 36 (SUTURE) ×2 IMPLANT
SUT VIC AB 2-0 UR6 27 (SUTURE) ×2 IMPLANT
SUT VIC AB 3-0 SH 8-18 (SUTURE) ×4 IMPLANT
SUT VICRYL 2 TP 1 (SUTURE) ×1 IMPLANT
SYRINGE 10CC LL (SYRINGE) ×3 IMPLANT
SYSTEM SAHARA CHEST DRAIN ATS (WOUND CARE) ×3 IMPLANT
TAPE CLOTH SURG 4X10 WHT LF (GAUZE/BANDAGES/DRESSINGS) ×1 IMPLANT
TOWEL OR 17X24 6PK STRL BLUE (TOWEL DISPOSABLE) ×6 IMPLANT
TOWEL OR 17X26 10 PK STRL BLUE (TOWEL DISPOSABLE) ×6 IMPLANT
TRAY FOLEY IC TEMP SENS 16FR (CATHETERS) ×3 IMPLANT
TROCAR XCEL BLADELESS 5X75MML (TROCAR) ×3 IMPLANT
TROCAR XCEL NON-BLD 11X100MML (ENDOMECHANICALS) ×6 IMPLANT
TUBE SUCT INTRACARD DLP 20F (MISCELLANEOUS) ×3 IMPLANT
TUNNELER SHEATH ON-Q 11GX8 DSP (PAIN MANAGEMENT) ×1 IMPLANT
UNDERPAD 30X30 INCONTINENT (UNDERPADS AND DIAPERS) ×3 IMPLANT
WATER STERILE IRR 1000ML POUR (IV SOLUTION) ×6 IMPLANT
WIRE J 3MM .035X145CM (WIRE) ×3 IMPLANT
YANKAUER SUCT BULB TIP NO VENT (SUCTIONS) ×1 IMPLANT

## 2015-06-28 NOTE — Procedures (Signed)
Extubation Procedure Note  Patient Details:   Name: Tracy Chen DOB: November 03, 1955 MRN: [id number]   Airway Documentation:   + air/cuff leak prior to extubation  Evaluation  O2 sats: stable throughout Complications: No apparent complications Patient did tolerate procedure well. Bilateral Breath Sounds: Clear   Yes, pt able to speak.  No distress noted, VSS, no stridor noted.  Pt denies SOB  [place], Debona 06/28/2015, 6:15 PM

## 2015-06-28 NOTE — OR Nursing (Signed)
Twenty minute call to SICU charge nurse at 1341.

## 2015-06-28 NOTE — Progress Notes (Signed)
Patient ID: Tracy Chen, female   DOB: [birth year], 58 y.o.   MRN: [id number]   SICU Evening Rounds:   Hemodynamically stable, atrial paced 80 CI = 2.57 on no drips  Extubated and alert. Stood up and took a few step already  Urine output good  CT output low  CBC    Component Value Date/Time   WBC 11.7* 06/28/2015 1435   WBC 7.0 04/28/2008 1106   RBC 3.40* 06/28/2015 1435   RBC 4.37 04/28/2008 1106   HGB 9.7* 06/28/2015 1435   HGB 13.0 04/28/2008 1106   HCT 29.2* 06/28/2015 1435   HCT 37.8 04/28/2008 1106   PLT 160 06/28/2015 1435   PLT 340 04/28/2008 1106   MCV 85.9 06/28/2015 1435   MCV 86.4 04/28/2008 1106   MCH 28.5 06/28/2015 1435   MCH 29.8 04/28/2008 1106   MCHC 33.2 06/28/2015 1435   MCHC 34.5 04/28/2008 1106   RDW 14.1 06/28/2015 1435   RDW 13.4 04/28/2008 1106   LYMPHSABS 1.9 05/22/2015 0759   LYMPHSABS 1.9 04/28/2008 1106   MONOABS 0.7 05/22/2015 0759   MONOABS 0.5 04/28/2008 1106   EOSABS 0.2 05/22/2015 0759   EOSABS 0.1 04/28/2008 1106   BASOSABS 0.0 05/22/2015 0759   BASOSABS 0.0 04/28/2008 1106     BMET    Component Value Date/Time   NA 140 06/28/2015 1430   K 3.6 06/28/2015 1430   CL 101 06/28/2015 1307   CO2 21* 06/26/2015 1230   GLUCOSE 104* 06/28/2015 1430   GLUCOSE 89 12/26/2005 1000   BUN 12 06/28/2015 1307   CREATININE 0.50 06/28/2015 1307   CREATININE 0.62 05/22/2015 0759   CALCIUM 9.5 06/26/2015 1230   GFRNONAA >60 06/26/2015 1230   GFRAA >60 06/26/2015 1230     A/P:  Stable postop course. Continue current plans

## 2015-06-28 NOTE — OR Nursing (Signed)
Forty five minute call to SICU charge nurse at 1319. 

## 2015-06-28 NOTE — Anesthesia Procedure Notes (Addendum)
Central Venous Catheter Insertion Performed by: anesthesiologist Patient location: Pre-op. Preanesthetic checklist: patient identified, IV checked, site marked, risks and benefits discussed, surgical consent, monitors and equipment checked, pre-op evaluation, timeout performed and anesthesia consent Position: Trendelenburg Lidocaine 1% used for infiltration Landmarks identified and Seldinger technique used Catheter size: 8.5 Fr Central line was placed.Sheath introducer Procedure performed using ultrasound guided technique. Attempts: 1 Following insertion, line sutured and dressing applied. Post procedure assessment: blood return through all ports, free fluid flow and no air. Patient tolerated the procedure well with no immediate complications.    Central Venous Catheter Insertion Performed by: anesthesiologist Patient location: Pre-op. Preanesthetic checklist: patient identified, IV checked, site marked, risks and benefits discussed, surgical consent, monitors and equipment checked, pre-op evaluation, timeout performed and anesthesia consent Landmarks identified PA cath was placed.Swan type and PA catheter depth:thermodilation and 50PA Cath depth:50 Procedure performed without using ultrasound guided technique. Attempts: 1 Patient tolerated the procedure well with no immediate complications.    Procedure Name: Intubation Date/Time: 06/28/2015 8:35 AM Performed by: Clearnce Sorrel Pre-anesthesia Checklist: Patient identified, Timeout performed, Emergency Drugs available, Suction available and Patient being monitored Patient Re-evaluated:Patient Re-evaluated prior to inductionOxygen Delivery Method: Circle system utilized Preoxygenation: Pre-oxygenation with 100% oxygen Intubation Type: IV induction Ventilation: Mask ventilation without difficulty Laryngoscope Size: Mac, 3 and Glidescope Grade View: Grade II Tube type: Oral Endobronchial tube: Double lumen EBT and 37 Fr Number of  attempts: 2 Airway Equipment and Method: Stylet Placement Confirmation: ETT inserted through vocal cords under direct vision,  breath sounds checked- equal and bilateral and positive ETCO2 Tube secured with: Tape Dental Injury: Teeth and Oropharynx as per pre-operative assessment

## 2015-06-28 NOTE — Interval H&P Note (Signed)
History and Physical Interval Note:  06/28/2015 7:21 AM  Tracy Chen  has presented today for surgery, with the diagnosis of MR  The various methods of treatment have been discussed with the patient and family. After consideration of risks, benefits and other options for treatment, the patient has consented to  Procedure(s): MINIMALLY INVASIVE MITRAL VALVE REPAIR (MVR) (Right) TRANSESOPHAGEAL ECHOCARDIOGRAM (TEE) (N/A) as a surgical intervention .  The patient's history has been reviewed, patient examined, no change in status, stable for surgery.  I have reviewed the patient's chart and labs.  Questions were answered to the patient's satisfaction.     Rexene Alberts

## 2015-06-28 NOTE — Anesthesia Preprocedure Evaluation (Addendum)
Anesthesia Evaluation  Patient identified by MRN, date of birth, ID band Patient awake    Reviewed: Allergy & Precautions, NPO status , Patient's Chart, lab work & pertinent test results  History of Anesthesia Complications Negative for: history of anesthetic complications  Airway Mallampati: III  TM Distance: >3 FB Neck ROM: Full    Dental  (+) Teeth Intact, Dental Advisory Given   Pulmonary neg pulmonary ROS,    breath sounds clear to auscultation       Cardiovascular hypertension, Pt. on medications (-) angina(-) CAD and (-) CHF + Valvular Problems/Murmurs MR  Rhythm:Regular Rate:Normal + Systolic murmurs [id number] ECHO: EF 60-65%, partial flail post leaflet with severe MR   Neuro/Psych negative neurological ROS  negative psych ROS   GI/Hepatic negative GI ROS, Neg liver ROS,   Endo/Other  negative endocrine ROS  Renal/GU negative Renal ROS     Musculoskeletal   Abdominal   Peds  Hematology negative hematology ROS (+)   Anesthesia Other Findings   Reproductive/Obstetrics                           Anesthesia Physical Anesthesia Plan  ASA: III  Anesthesia Plan: General   Post-op Pain Management:    Induction: Intravenous  Airway Management Planned: Double Lumen EBT  Additional Equipment: Arterial line, CVP, PA Cath, Ultrasound Guidance Line Placement and 3D TEE  Intra-op Plan:   Post-operative Plan: Post-operative intubation/ventilation  Informed Consent: I have reviewed the patients History and Physical, chart, labs and discussed the procedure including the risks, benefits and alternatives for the proposed anesthesia with the patient or authorized representative who has indicated his/her understanding and acceptance.   Dental advisory given  Plan Discussed with: CRNA, Surgeon and Anesthesiologist  Anesthesia Plan Comments: (Plan routine monitors, A line, PA cath, GETA with TEE  and post op ventilation)      Anesthesia Quick Evaluation

## 2015-06-28 NOTE — Op Note (Signed)
CARDIOTHORACIC SURGERY OPERATIVE NOTE  Date of Procedure:  06/28/2015  Preoperative Diagnosis: Severe Mitral Regurgitation  Postoperative Diagnosis: Same  Procedure:    Minimally-Invasive Mitral Valve Repair  Complex valvuloplasty including triangular resection of flail segment of posterior leaflet (P2)  Artificial Gore-tex neochord placement x4  Closure of cleft between P2 and P3  Sorin Memo 3D Ring Annuloplasty (size 2mm, catalog # [id number], serial # [id number])    Surgeon: Valentina Gu. 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 A transthoracic echocardiogram was performed demonstrating the presence of mitral valve prolapse with severe mitral regurgitation and normal left ventricular systolic function. The patient was referred to Dr. Acie Fredrickson for consultation and underwent transesophageal echocardiogram on 04/25/2015. This confirmed the presence of mitral valve prolapse with a flail segment of the posterior leaflet and severe mitral regurgitation. Left ventricular systolic function remains preserved. The patient was referred for surgical consultation.  The patient has been seen in consultation and counseled at length regarding the indications, risks and potential benefits of surgery.  All questions have been answered, and the patient provides full informed consent for the  operation as described.    DETAILS OF THE OPERATIVE PROCEDURE  Preparation:  The patient is brought to the operating room on the above mentioned date and central monitoring was established by the anesthesia team including placement of Swan-Ganz catheter through the left internal jugular vein.  A radial arterial line is placed. The patient is placed in the supine position on the operating table.  Intravenous antibiotics are administered. General endotracheal anesthesia is induced uneventfully. The patient is initially intubated using a dual lumen endotracheal tube.  A Foley catheter is placed.  Baseline transesophageal echocardiogram was performed.  Findings were notable for mitral valve prolapse with severe mitral regurgitation. There is an obvious flail segment of the posterior leaflet with ruptured primary chordae tendoneae. There is severe mitral regurgitation with the jet of regurgitation coursing anteriorly around the left atrium. There was flow reversal in the pulmonary veins. There is normal left ventricular systolic function. The right ventricle appeared mildly dilated. There was trivial tricuspid regurgitation.  A soft roll is placed behind the patient's left scapula and the neck gently extended and turned to the left.   The patient's right neck, chest, abdomen, both groins, and both lower extremities are prepared and draped in a sterile manner. A time out procedure is performed.  Surgical Approach:  A right miniature anterolateral thoracotomy incision is performed. The incision is placed just lateral to and superior to the right nipple. The pectoralis major muscle is retracted medially and completely preserved. The right pleural space is entered through the 3rd intercostal space. A soft tissue retractor is placed.  Two 11 mm ports are placed through separate stab incisions inferiorly. The right pleural space is insufflated continuously with carbon dioxide gas through the posterior port  during the remainder of the operation.  A  pledgeted sutures placed through the dome of the right hemidiaphragm and retracted inferiorly to facilitate exposure.  A longitudinal incision is made in the pericardium 3 cm anterior to the phrenic nerve and silk traction sutures are placed on either side of the incision for exposure.   Extracorporeal Cardiopulmonary Bypass and Myocardial Protection:  A small incision is made in the right inguinal crease and the anterior surface of the right common femoral artery and right common femoral vein are identified.  The patient is placed in Trendelenburg position. The right internal jugular vein is cannulated with Seldinger technique and a guidewire advanced into the right atrium. The patient is heparinized systemically. The right internal jugular vein is cannulated with a 14 [place] pediatric femoral venous cannula. Pursestring sutures are placed on the anterior surface of the right common femoral vein and right common femoral artery. The right common femoral vein is cannulated with the Seldinger technique and a guidewire is advanced under transesophageal echocardiogram guidance through the right atrium. The femoral vein is cannulated with a long 22 French femoral venous cannula. The right common femoral artery is cannulated with Seldinger technique and a flexible guidewire is advanced until it can be appreciated intraluminally in the descending thoracic aorta on transesophageal echocardiogram. The femoral artery is cannulated with an 18 French femoral arterial cannula.  Adequate heparinization is verified.     The entire pre-bypass portion of the operation was notable for stable hemodynamics.  Cardiopulmonary bypass was begun.  Vacuum assist venous drainage is utilized. The incision in the pericardium is extended in both directions. Venous drainage and exposure are notably excellent. A retrograde cardioplegia cannula is placed through the right atrium into the coronary  sinus using transesophageal echocardiogram guidance.  An antegrade cardioplegia cannula is placed in the ascending aorta.    The patient is cooled to 28C systemic temperature.  The aortic cross clamp is applied and cold blood cardioplegia is delivered initially in an antegrade fashion through the aortic root.   Supplemental cardioplegia is given retrograde through the coronary sinus catheter. The initial cardioplegic arrest is rapid with early diastolic arrest.  Repeat doses of cardioplegia are administered intermittently every 20 to 30 minutes throughout the entire cross clamp portion of the operation through the aortic root and through the coronary sinus catheter in order to maintain completely flat electrocardiogram.  Myocardial protection was felt to be excellent.   Mitral Valve Repair:  A left atriotomy incision was performed through the interatrial groove and extended partially across the back wall of the left atrium after opening the oblique sinus inferiorly.  The mitral valve is exposed using a self-retaining retractor.  The mitral valve was inspected and notable for fibroelastic deficiency type degenerative disease. A large portion of the middle scallop (P2) of the posterior leaflet was flail with multiple ruptured primary chordae tendoneae and severe prolapse. Segment of prolapse incorporated the portion of P2 receiving chordae tendoneae from the posterior papillary muscle. There was a fairly big cleft between P2 and P3. P3 was relatively small but otherwise normal. P1 was normal. The anterior leaflet was normal. There was no annular calcification.  Interrupted 2-0 Ethibond horizontal mattress sutures are placed circumferentially around the entire mitral valve annulus. The sutures will ultimately be utilized for ring annuloplasty, and at this juncture there are utilized to suspend the valve symmetrically.  The flail segment of P2 was repaired using a simple triangular resection.  A total of  approximately 20% of the surface area of P2 was resected. The  intervening vertical defect in P2 was closed using interrupted everting simple CV5 Gore-Tex sutures.  Artificial neochord placement was performed using Chord-X multi-strand CV-4 Goretex pre-measured loops.  The appropriate cord length was measured from corresponding normal length primary cords from the P1 segment of the posterior leaflet. The papillary muscle suture of the Chord-X multi-strand suture was placed through the head of the posterior papillary muscle in a horizontal mattress fashion and tied. Two of the three pre-measured loops were then reimplanted into the free margin of the P2 segment of the posterior leaflet.  The third loop was discarded.  The valve was tested with saline and appeared competent even without ring annuloplasty complete. However, the cleft between P2 and P3 appeared prominent and given the relatively small size of P3 there appeared to be the potential for slight residual leak through the cleft. Subsequently the cleft between P2 and P3 was closed using interrupted everting CV-5 Gore-Tex suture.  The valve was sized to a 32 mm annuloplasty ring, based upon the transverse distance between the left and right commissures and the height of the anterior leaflet, corresponding to a size just slightly larger than the overall surface area of the anterior leaflet.  A Sorin Memo 3D annuloplasty ring (size 32 mm, catalog [id number], serial [id number]) was secured in place uneventfully. All ring sutures were secured using a Cor-knot device.  The valve was tested with saline and appeared competent.   The valve is again tested with saline and appears to be perfectly competent with a broad symmetrical line of coaptation of the anterior and posterior leaflet. There is no residual leak. There was a broad, symmetrical line of coaptation of the anterior and posterior leaflet which was confirmed using the blue ink test.  Rewarming is  begun.   Procedure Completion:  The atriotomy was closed using a 2-layer closure of running 3-0 Prolene suture after placing a sump drain across the mitral valve to serve as a left ventricular vent.  One final dose of warm retrograde "hot shot" cardioplegia was administered retrograde through the coronary sinus catheter while all air was evacuated through the aortic root.  The aortic cross clamp was removed after a total cross clamp time of 103 minutes.  Epicardial pacing wires are fixed to the inferior wall of the right ventricule and to the right atrial appendage. The patient is rewarmed to 37C temperature. The left ventricular vent is removed.  The patient is ventilated and flow volumes turndown while the mitral valve repair is inspected using transesophageal echocardiogram. The valve repair appears intact with no residual leak. The antegrade cardioplegia cannula is now removed. The patient is weaned and disconnected from cardiopulmonary bypass.  The patient's rhythm at separation from bypass was sinus.  The patient was weaned from bypass without any inotropic support. Total cardiopulmonary bypass time for the operation was 140 minutes.  Followup transesophageal echocardiogram performed after separation from bypass revealed a well-seated annuloplasty ring in the mitral position with a normal functioning mitral valve. There was no residual leak.  Left ventricular function was unchanged from preoperatively.  The mean gradient across the mitral valve was estimated to be 1 mmHg.  The femoral arterial and venous cannulae were removed uneventfully. There was a palpable pulse in the distal right common femoral artery after removal of the cannula. Protamine was administered to reverse the anticoagulation. The right internal jugular cannula was removed and manual pressure held on the neck for 15 minutes.  Single lung ventilation was begun. The atriotomy closure  was inspected for hemostasis. The pericardial sac  was drained using a 28 French Bard drain placed through the anterior port incision.  The pericardium was closed using a patch of core matrix bovine submucosal tissue patch. The right pleural space is irrigated with saline solution and inspected for hemostasis. The right pleural space was drained using a 28 French Bard drain placed through the posterior port incision. The miniature thoracotomy incision was closed in multiple layers in routine fashion. The right groin incision was inspected for hemostasis and closed in multiple layers in routine fashion.  The post-bypass portion of the operation was notable for stable rhythm and hemodynamics.  No blood products were administered during the operation.   Disposition:  The patient tolerated the procedure well.  The patient was reintubated using a single lumen endotracheal tube and subsequently transported to the surgical intensive care unit in stable condition. There were no intraoperative complications. All sponge instrument and needle counts are verified correct at completion of the operation.     Valentina Gu. Roxy Manns MD 06/28/2015 2:12 PM

## 2015-06-28 NOTE — Progress Notes (Signed)
  Echocardiogram Echocardiogram Transesophageal has been performed.  Tracy Chen 06/28/2015, 9:22 AM

## 2015-06-28 NOTE — H&P (Signed)
[address]       [place] [address]             [phone]          CARDIOTHORACIC SURGERY HISTORY AND PHYSICAL EXAM  Referring Provider is Nahser, Wonda Cheng, MD PCP is Tracy Dawson, MD  Chief Complaint  Patient presents with  . Mitral Regurgitation    Surgical eval, TEE 04/25/15,Cardiac Cath scheduled for 05/29/15    HPI:  Patient is a 60 year old female with no previous cardiac history who was recently found to have a prominent systolic murmur on routine physical examination by her primary care physician. A transthoracic echocardiogram was performed demonstrating the presence of mitral valve prolapse with severe mitral regurgitation and normal left ventricular systolic function. The patient was referred to Dr. Acie Chen for consultation and underwent transesophageal echocardiogram on 04/25/2015. This confirmed the presence of mitral valve prolapse with a flail segment of the posterior leaflet and severe mitral regurgitation. Left ventricular systolic function remains preserved. The patient was referred for surgical consultation.  The patient is married and lives locally in [place] with her husband. 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 She is originally from [place] and relocated to [place] several years ago. She has been physically active and healthy for her entire adult life with exception of the development of breast cancer for which she was treated nearly 15 years ago. She remains physically active and states that she exercises on a regular basis. She does not push her self terribly hard when she exercises, but she specifically denies any symptoms of exertional shortness of breath or chest discomfort. She has not noticed any significant change in her exercise tolerance in recent years. She denies any history of resting shortness of breath, PND, orthopnea, palpitations, dizzy spells, or syncope.  .Patient  returns to the office today for follow-up of mitral valve prolapse with stage C severe asymptomatic primary mitral regurgitation. She was originally seen in consultation on 05/07/2015. She was seen most recently on 06/04/2015 and she returns to the office today for follow-up with plans to proceed with elective mitral valve repair later this week. She reports no new problems or complaints.          Past Medical History  Diagnosis Date  . Allergic rhinitis   . HX: breast cancer     hx of 2002 left er/pr 3.2cm  . Osteoporosis     L5  . History of chickenpox     as a child  . Gallstone   . Breast cancer ([place])   . 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 Mitral valve prolapse 02/21/2015  . Severe mitral regurgitation 02/21/2015    Past Surgical History  Procedure Laterality Date  . Abd [place]      gallstones CT gallstones 1/06  . Parathyroidectomy  11/06    left inferior  . Partial left mastectomy     . Colonoscopy    . Tee without cardioversion N/A 04/25/2015    Procedure: TRANSESOPHAGEAL ECHOCARDIOGRAM (TEE);  Surgeon: Thayer Headings, MD;  Location: [facility] ENDOSCOPY;  Service: Cardiovascular;  Laterality: N/A;  . Cardiac catheterization N/A 05/29/2015    Procedure: Right/Left Heart Cath and Coronary Angiography;  Surgeon: Peter M Martinique, MD;  Location: [facility];  Service: Cardiovascular;  Laterality: N/A;    Family History  Problem Relation Age of Onset  . Osteoporosis Maternal Aunt   .  Colon cancer Maternal Aunt   . Thyroid disease    . Heart murmur Brother     Picked up on exam followed by echo.  . Colon cancer Mother   . Valvular heart disease Father     followed     Social History Social History  Substance Use Topics  . Smoking status: Never Smoker   . Smokeless tobacco: Never Used  . Alcohol Use: No    Prior to Admission medications   Medication Sig Start Date End Date Taking? Authorizing Provider  amiodarone (PACERONE) 200 MG tablet Take 1  tablet (200 mg total) by mouth daily with breakfast. 06/04/15  Yes Rexene Alberts, MD  hydrochlorothiazide (HYDRODIURIL) 25 MG tablet Take 1 tablet (25 mg total) by mouth daily. 03/29/15  Yes Thayer Headings, MD  potassium chloride SA (K-DUR,KLOR-CON) 20 MEQ tablet Take 1 tablet (20 mEq total) by mouth once. Patient taking differently: Take 20 mEq by mouth daily.  03/29/15  Yes Thayer Headings, MD  naphazoline-pheniramine ([employer] EYE ALLERGY RELIEF) 0.025-0.3 % ophthalmic solution Place 1 drop into both eyes 4 (four) times daily as needed for irritation or allergies.    Historical Provider, MD    Allergies  Allergen Reactions  . Penicillins Other (See Comments)    Has patient had a PCN reaction causing immediate rash, facial/tongue/throat swelling, SOB or lightheadedness with hypotension: No Has patient had a PCN reaction causing severe rash involving mucus membranes or skin necrosis: No Has patient had a PCN reaction that required hospitalization No Has patient had a PCN reaction occurring within the last 10 years: No If all of the above answers are "NO", then may proceed with Cephalosporin use.      Review of Systems:  General:normal appetite, normal energy, no weight gain, no weight loss, no fever Cardiac:no chest pain with exertion, no chest pain at rest, no SOB with exertion, no resting SOB, no PND, no orthopnea, no palpitations, no arrhythmia, no atrial fibrillation, no LE edema, no dizzy spells, no syncope Respiratory:no shortness of breath, no home oxygen, no productive cough, no dry cough, no bronchitis, no wheezing, no hemoptysis, no asthma, no pain with inspiration or cough, no sleep apnea, no CPAP at night GI:no difficulty swallowing, no reflux, no frequent heartburn, no hiatal hernia, no abdominal pain, no constipation, no diarrhea, no  hematochezia, no hematemesis, no melena GU:no dysuria, no frequency, no urinary tract infection, no hematuria, no kidney stones, no kidney disease Vascular:no pain suggestive of claudication, no pain in feet, no leg cramps, no varicose veins, no DVT, no non-healing foot ulcer Neuro:no stroke, no TIA's, no seizures, no headaches, no temporary blindness one eye, no slurred speech, no peripheral neuropathy, no chronic pain, no instability of gait, no memory/cognitive dysfunction Musculoskeletal:no arthritis, no joint swelling, no myalgias, no difficulty walking, normal mobility  Skin:no rash, no itching, no skin infections, no pressure sores or ulcerations Psych:no anxiety, no depression, no nervousness, no unusual recent stress Eyes:no blurry vision, no floaters, no recent vision changes, does not wears glasses or contacts ENT:no hearing loss, no loose or painful teeth, no dentures, last saw dentist October 2016 Hematologic:no easy bruising, no abnormal bleeding, no clotting disorder, no frequent epistaxis Endocrine:no diabetes, does not check CBG's at home   Physical Exam:  BP 154/84 mmHg  Pulse 79  Resp 16  Ht 5' 7.5" (1.715 m)  Wt 157 lb (71.215 kg)  BMI 24.21 kg/m2  SpO2 98% General:Slightly obese, well-appearing HEENT:Unremarkable  Neck:no JVD, no bruits,  no adenopathy  Chest:clear to auscultation, symmetrical breath sounds, no wheezes, no rhonchi   CV:RRR, prominent grade IV/VI holosystolic murmur best at LLSB Abdomen:soft, non-tender, no masses  Extremities:warm, well-perfused, pulses palpable, no LE edema Rectal/GUDeferred Neuro:Grossly non-focal and symmetrical throughout Skin:Clean and dry, no rashes, no breakdown   Diagnostic Tests:  Transthoracic Echocardiography  Patient:  [place], Tracy Chen MR #:    [id number] Study Date: 02/21/2015 Gender:   F Age:    52 Height:   167.6 cm Weight:   69.4 kg BSA:    1.81 m^2 Pt. Status: Room:  ATTENDING  Sonia Side   Panosh, Wanda K REFERRING  Panosh, Standley Brooking SONOGRAPHER Marygrace Drought, RCS PERFORMING  [facility], Outpatient  cc:  ------------------------------------------------------------------- LV EF: 55% -  60%  ------------------------------------------------------------------- Indications:   Murmur (R01.0).  ------------------------------------------------------------------- History:  PMH: [place] hypertension, Hx of Breast cancer(2002)  ------------------------------------------------------------------- Study Conclusions  - Left ventricle: The cavity size was normal. There was mild concentric hypertrophy. Systolic function was normal. The estimated ejection fraction was in the range of 55% to 60%. Wall motion was normal; there were no regional wall motion abnormalities. Features are consistent with a pseudonormal left ventricular filling pattern, with concomitant abnormal relaxation and increased filling pressure (grade 2 diastolic dysfunction). Doppler parameters are consistent with elevated ventricular end-diastolic filling pressure. - Aortic valve: Trileaflet; normal thickness leaflets.  There was no regurgitation. - Aortic root: The aortic root was normal in size. - Mitral valve: Mildly thickened leaflets. There is prolapse of the posterior mitral valve leaflet with posteriorly directed jet of mitral regurgitations. Mitral regurgitation appears severe. There was severe regurgitation. - Left atrium: The atrium was mildly dilated. - Right ventricle: Systolic function was normal. - Right atrium: The atrium was normal in size. - Tricuspid valve: Structurally normal valve. There was mild regurgitation. - Pulmonic valve: There was trivial regurgitation. - Pulmonary arteries: Systolic pressure was within the normal range. - Pericardium, extracardiac: There was no pericardial effusion.  Impressions:  - There is prolapse of the posterior mitral valve leaflet with posteriorly directed jet of mitral regurgitations. Mitral regurgitation appears severe.  Transthoracic echocardiography. M-mode, complete 2D, spectral Doppler, and color Doppler. Birthdate: Patient birthdate: 03-27-1955. Age: Patient is 60 yr old. Sex: Gender: female. BMI: 24.7 kg/m^2. Blood pressure:   162/88 Patient status: Outpatient. Study date: Study date: 02/21/2015. Study time: 08:44 AM. Location: [facility]  -------------------------------------------------------------------  ------------------------------------------------------------------- Left ventricle: The cavity size was normal. There was mild concentric hypertrophy. Systolic function was normal. The estimated ejection fraction was in the range of 55% to 60%. Wall motion was normal; there were no regional wall motion abnormalities. Features are consistent with a pseudonormal left ventricular filling pattern, with concomitant abnormal relaxation and increased filling pressure (grade 2 diastolic dysfunction). Doppler parameters are consistent with elevated ventricular end-diastolic  filling pressure.  ------------------------------------------------------------------- Aortic valve:  Trileaflet; normal thickness leaflets. Mobility was not restricted. Doppler: Transvalvular velocity was within the normal range. There was no stenosis. There was no regurgitation.  ------------------------------------------------------------------- Aorta: Aortic root: The aortic root was normal in size.  ------------------------------------------------------------------- Mitral valve: Mildly thickened leaflets. There is prolapse of the posterior mitral valve leaflet with posteriorly directed jet of mitral regurgitations. Mitral regurgitation appears severe. Mobility was not restricted. Doppler: Transvalvular velocity was within the normal range. There was no evidence for stenosis. There was severe regurgitation.  Peak gradient (D): 8 mm Hg.  ------------------------------------------------------------------- Left atrium: The atrium was mildly dilated.  ------------------------------------------------------------------- Right ventricle:  The cavity size was normal. Wall thickness was normal. Systolic function was normal.  ------------------------------------------------------------------- Pulmonic valve:  Doppler: Transvalvular velocity was within the normal range. There was no evidence for stenosis. There was trivial regurgitation.  ------------------------------------------------------------------- Tricuspid valve:  Structurally normal valve.  Doppler: Transvalvular velocity was within the normal range. There was mild regurgitation.  ------------------------------------------------------------------- Pulmonary artery:  The main pulmonary artery was normal-sized. Systolic pressure was within the normal range.  ------------------------------------------------------------------- Right atrium: The atrium was normal in  size.  ------------------------------------------------------------------- Pericardium: There was no pericardial effusion.  ------------------------------------------------------------------- Systemic veins: Inferior vena cava: The vessel was normal in size. The respirophasic diameter changes were in the normal range (= 50%), consistent with normal central venous pressure. Diameter: 14 mm.  ------------------------------------------------------------------- Measurements  IVC                     Value    Reference ID                     14  mm   ---------  Left ventricle               Value    Reference LV ID, ED, PLAX chordal           51  mm   43 - 52 LV ID, ES, PLAX chordal           33.4 mm   23 - 38 LV fx shortening, PLAX chordal       35  %   >=29 LV PW thickness, ED             11.9 mm   --------- IVS/LV PW ratio, ED             0.99     <=1.3 Stroke volume, 2D              58  ml   --------- Stroke volume/bsa, 2D            32  ml/m^2 --------- LV ejection fraction, 1-p A4C        60  %   --------- LV e&', lateral               10.3 cm/s  --------- LV E/e&', lateral              13.59    --------- LV e&', medial                9.54 cm/s  --------- LV E/e&', medial               14.68    --------- LV e&', average               9.92 cm/s  --------- LV E/e&', average              14.11    ---------  Ventricular septum             Value    Reference IVS thickness, ED              11.8 mm   ---------  LVOT                    Value    Reference LVOT ID, S                 19  mm   --------- LVOT  area  2.84 cm^2  --------- LVOT peak velocity, S            96.3 cm/s  --------- LVOT mean velocity, S            67.2 cm/s  --------- LVOT VTI, S                 20.3 cm   --------- LVOT peak gradient, S            4   mm Hg ---------  Aorta                    Value    Reference Aortic root ID, ED             31  mm   ---------  Left atrium                 Value    Reference LA ID, A-P, ES               39  mm   --------- LA ID/bsa, A-P               2.16 cm/m^2 <=2.2 LA volume, S                87  ml   --------- LA volume/bsa, S              48.1 ml/m^2 --------- LA volume, ES, 1-p A4C           88  ml   --------- LA volume/bsa, ES, 1-p A4C         48.7 ml/m^2 --------- LA volume, ES, 1-p A2C           79  ml   --------- LA volume/bsa, ES, 1-p A2C         43.7 ml/m^2 ---------  Mitral valve                Value    Reference Mitral E-wave peak velocity         140  cm/s  --------- Mitral A-wave peak velocity         82.4 cm/s  --------- Mitral deceleration time      (L)   148  ms   150 - 230 Mitral peak gradient, D           8   mm Hg --------- Mitral E/A ratio, peak           1.7     ---------  Pulmonary arteries             Value    Reference PA pressure, S, DP             29  mm Hg <=30  Tricuspid valve               Value    Reference Tricuspid regurg peak velocity       254  cm/s  --------- Tricuspid peak RV-RA gradient        26  mm Hg --------- Tricuspid maximal regurg velocity,     254  cm/s   --------- PISA  Systemic veins               Value    Reference Estimated CVP                3   mm Hg ---------  Right ventricle  Value    Reference RV pressure, S, DP             29  mm Hg <=30 RV s&', lateral, S              14.9 cm/s  ---------  Legend: (L) and (H) mark values outside specified reference range.  ------------------------------------------------------------------- Prepared and Electronically Authenticated by  Ena Dawley, M.D. 2016-12-14T15:15:36    Transesophageal Echocardiography  Patient:  [place], [place] Chen MR #:    [id number] Study Date: 04/25/2015 Gender:   F Age:    82 Height:   170.2 cm Weight:   71.4 kg BSA:    1.85 m^2 Pt. Status: Room:  ATTENDING  Mertie Moores, M.D. PERFORMING  Mertie Moores, M.D. REFERRING  Mertie Moores, M.D. SONOGRAPHER Donata Clay ADMITTING  Nahser, Peggyann Juba   Nahser, Jr  cc:  ------------------------------------------------------------------- LV EF: 60% -  65%  ------------------------------------------------------------------- Indications:   Mitral valve prolapse 424.0.  ------------------------------------------------------------------- Study Conclusions  - Left ventricle: Systolic function was normal. The estimated ejection fraction was in the range of 60% to 65%. Wall motion was normal; there were no regional wall motion abnormalities. - Aortic valve: No evidence of vegetation. - Mitral valve: Severe prolapse, involving the posterior leaflet. There was severe regurgitation. - Left atrium: The atrium was moderately dilated. No evidence of thrombus in the atrial cavity or appendage. - Right atrium: No evidence of thrombus in the atrial cavity or appendage. - Atrial septum: No defect or patent foramen ovale was identified. - Pulmonic valve:  No evidence of vegetation.  Diagnostic transesophageal echocardiography. 2D and color Doppler. Birthdate: Patient birthdate: [birth year]. Age: Patient is 60 yr old. Sex: Gender: female.  BMI: 24.6 kg/m^2. Blood pressure: 145/65 Patient status: Outpatient. Study date: Study date: 04/25/2015. Study time: 09:04 AM. Location: Endoscopy.  -------------------------------------------------------------------  ------------------------------------------------------------------- Left ventricle: Systolic function was normal. The estimated ejection fraction was in the range of 60% to 65%. Wall motion was normal; there were no regional wall motion abnormalities.  ------------------------------------------------------------------- Aortic valve:  Structurally normal valve.  Cusp separation was normal. No evidence of vegetation. Doppler: There was no regurgitation.  ------------------------------------------------------------------- Aorta: The aorta was normal, not dilated, and non-diseased.  ------------------------------------------------------------------- Mitral valve: There is evidence for reversal of flow in the pulmonary veins. There is a partially flail scallop of the posterior leaflet. Severe prolapse, involving the posterior leaflet. Doppler: There was severe regurgitation.  ------------------------------------------------------------------- Left atrium: The atrium was moderately dilated. No evidence of thrombus in the atrial cavity or appendage.  ------------------------------------------------------------------- Atrial septum: No defect or patent foramen ovale was identified.  ------------------------------------------------------------------- Right ventricle: The cavity size was normal. Wall thickness was normal. Systolic function was normal.  ------------------------------------------------------------------- Pulmonic valve:  Structurally  normal valve.  Cusp separation was normal. No evidence of vegetation.  ------------------------------------------------------------------- Tricuspid valve:  The valve appears to be grossly normal. Doppler: There was no regurgitation.  ------------------------------------------------------------------- Right atrium: The atrium was normal in size. No evidence of thrombus in the atrial cavity or appendage.  ------------------------------------------------------------------- Post procedure conclusions Ascending Aorta:  - The aorta was normal, not dilated, and non-diseased.  ------------------------------------------------------------------- Prepared and Electronically Authenticated by  Mertie Moores, M.D. 2017-02-15T13:11:35   CARDIAC CATHETERIZATION Procedures    Right/Left Heart Cath and Coronary Angiography    Conclusion     There is hyperdynamic left ventricular systolic function.  1. Normal coronary anatomy 2. Normal LV function 3. Normal right heart pressures. 4. Moderate to severe mitral insufficiency  Indications    Severe mitral insufficiency [I34.0 (ICD-10-CM)]    Technique and Indications    Indication: 60 yo WF with MV prolapse and severe MR due to flail posterior leaflet.  Procedural Details: The right wrist was prepped, draped, and anesthetized with 1% lidocaine. Using the modified Seldinger technique a 6 Fr slender sheath was placed in the right radial artery and a 5 French sheath was placed in the right brachial vein. A Swan-Ganz catheter was used for the right heart catheterization. Standard protocol was followed for recording of right heart pressures and sampling of oxygen saturations. Fick cardiac output was calculated. Standard Judkins catheters were used for selective coronary angiography and left ventriculography. There were no immediate procedural complications. The patient was transferred to the post catheterization  recovery area for further monitoring.  Contrast: 60 cc  During this procedure the patient is administered a total of Versed 3 mg and Fentanyl 50 mg to achieve and maintain moderate conscious sedation. The patient's heart rate, blood pressure, and oxygen saturation are monitored continuously during the procedure. The period of conscious sedation is 60 minutes, of which I was present face-to-face 100% of this time.  Estimated blood loss <50 mL. There were no immediate complications during the procedure.    Coronary Findings    Dominance: Right   Left Main  Vessel was injected. Vessel is normal in caliber. Vessel is angiographically normal.     Left Anterior Descending  Vessel was injected. Vessel is normal in caliber. Vessel is angiographically normal.     Left Circumflex  Vessel was injected. Vessel is normal in caliber. Vessel is angiographically normal.     Right Coronary Artery  Vessel was injected. Vessel is normal in caliber. Vessel is angiographically normal.       Right Heart Pressures Normal right heart pressures.  Due to marked tortuosity of the venous system I was unable to advance the Gordy Councilman catheter to a wedge position.    Wall Motion                 Left Heart    Left Ventricle The left ventricular size is normal. There is hyperdynamic left ventricular systolic function. The left ventricular ejection fraction is 55-65% by visual estimate. There are no wall motion abnormalities in the left ventricle.   Mitral Valve There is severe (4+) mitral regurgitation and mitral valve prolapse present.    Coronary Diagrams    Diagnostic Diagram            Implants    No implant documentation for this case.    PACS Images    Show images for Cardiac catheterization     Link to Procedure Log    Procedure Log      Hemo Data       Most Recent Value   Fick Cardiac  Output  6.16 L/min   Fick Cardiac Output Index  3.37 (L/min)/BSA   RA A Wave  10 mmHg   RA V Wave  7 mmHg   RA Mean  6 mmHg   RV Systolic Pressure  37 mmHg   RV Diastolic Pressure  5 mmHg   RV EDP  9 mmHg   PA Systolic Pressure  30 mmHg   PA Diastolic Pressure  15 mmHg   PA Mean  22 mmHg   AO Systolic Pressure  123XX123 mmHg   AO Diastolic Pressure  71 mmHg   AO Mean  93 mmHg   LV Systolic Pressure  136 mmHg   LV Diastolic Pressure  11 mmHg   LV EDP  21 mmHg   Arterial Occlusion Pressure Extended Systolic Pressure  Q000111Q mmHg   Arterial Occlusion Pressure Extended Diastolic Pressure  72 mmHg   Arterial Occlusion Pressure Extended Mean Pressure  100 mmHg   Left Ventricular Apex Extended Systolic Pressure  [id number] mmHg   Left Ventricular Apex Extended Diastolic Pressure  10 mmHg   Left Ventricular Apex Extended EDP Pressure  21 mmHg   QP/QS  1   TPVR Index  6.54 HRUI   TSVR Index  27.63 HRUI   TPVR/TSVR Ratio  0.24     CT ANGIOGRAPHY CHEST, ABDOMEN AND PELVIS  TECHNIQUE: Multidetector CT imaging through the chest, abdomen and pelvis was performed using the standard protocol during bolus administration of intravenous contrast. Multiplanar reconstructed images and MIPs were obtained and reviewed to evaluate the vascular anatomy.  CONTRAST: 100 mL Isovue 370  COMPARISON: 03/21/2004  FINDINGS: CTA CHEST FINDINGS  Normal caliber of the thoracic aorta without dissection. Bovine arch and the great vessels are patent. Main pulmonary artery measures up to 3.2 cm. Main and central pulmonary arteries are patent without filling defects. Heart size is normal. No significant pericardial or pleural fluid. Surgical clips in the left breast and left axilla.  Trachea and mainstem bronchi are patent. 3 mm nodule at the right lung base on sequence 8, image 43  is probably stable from the examination in 2006. There is a pleural-based 3 mm nodule in the right upper lobe on sequence 8, image 17 which is indeterminate. 3 mm nodule along the anterior right upper lobe on sequence 8 image 17. Subtle ground-glass disease in the lingula may be related to atelectasis. No large areas of airspace disease or consolidation. No suspicious osseous findings. Evidence for hemangiomas involving the T11 and T10 vertebral bodies.  Review of the MIP images confirms the above findings.  CTA ABDOMEN AND PELVIS FINDINGS  Normal caliber of the abdominal aorta without evidence of atherosclerotic disease or aneurysm. Celiac trunk, IMA and SMA are widely patent. Main bilateral renal arteries are patent. There is a small accessory right renal artery. Normal caliber of the iliac arteries bilaterally without plaque or stenosis. Proximal femoral arteries are widely patent without plaque or aneurysm. Portal venous system is patent. IVC and renal veins are patent.  No acute abnormality to the liver or gallbladder. Normal appearance of the spleen and pancreas. Normal appearance of bilateral adrenal glands. Normal appearance of the urinary bladder, uterus and adnexal structures. Few diverticula involving the sigmoid colon without inflammation. Mild sclerosis and irregularity of the SI joints bilaterally, essentially unchanged since 2006.  Review of the MIP images confirms the above findings.  IMPRESSION: No significant atherosclerotic disease in the chest, abdomen or pelvis. Normal caliber of the aorta without aneurysm or dissection.  No acute abnormality in the chest, abdomen or pelvis.  At least 3 small nodular densities in the right lung, largest measuring 3 mm. At least 2 of these small nodules are age indeterminate. No follow-up needed if patient is low-risk. Non-contrast chest CT can be considered in 12 months if patient is high-risk. This recommendation  follows the consensus statement: Guidelines for Management of Incidental Pulmonary Nodules Detected on CT Images:From the Fleischner Society 2017; published online before print (10.1148/radiol.SG:[id number]).   Electronically Signed  By: Markus Daft M.D.  On: 06/01/2015 13:32         Impression:  Patient has stage C severe asymptomatic primary mitral  regurgitation. I have personally reviewed the patient's recent transthoracic and transesophageal echocardiogram. 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 The jet of regurgitation wraps completely around the entire left atrium. There is flow reversal in the pulmonary veins. Left ventricular systolic function remains normal. There are no other complicating features. Options include continued close observation on medical therapy versus proceeding with elective mitral valve repair in the near future. Risks associated with elective surgery should be very low and I feel that there is a greater than 95% likelihood her valve can be repaired with anticipation of a durable result. Diagnostic cardiac catheterization is notable for the absence of significant coronary artery disease and normal pulmonary artery pressures. She appears to be a good candidate for minimally invasive approach for surgery.    Plan:  The patient was again counseled at length regarding the indications, risks and potential benefits of mitral valve repair. The rationale for elective surgery has been explained, including a comparison between surgery and continued medical therapy with close follow-up. The likelihood of successful and durable valve repair has been discussed with particular reference to the findings of their recent echocardiogram. Based upon these findings and previous experience, I have quoted them a greater than 95 percent likelihood of successful valve repair. In  the unlikely event that their valve cannot be successfully repaired, we discussed the possibility of replacing the mitral valve using a mechanical prosthesis with the attendant need for long-term anticoagulation versus the alternative of replacing it using a bioprosthetic tissue valve with its potential for late structural valve deterioration and failure, depending upon the patient's longevity. The patient specifically requests that if the mitral valve must be replaced that it be done using a mechanical valve. The patient understands and accepts all potential risks of surgery including but not limited to risk of death, stroke or other neurologic complication, myocardial infarction, congestive heart failure, respiratory failure, renal failure, bleeding requiring transfusion and/or reexploration, arrhythmia, infection or other wound complications, pneumonia, pleural and/or pericardial effusion, pulmonary embolus, aortic dissection or other major vascular complication, or delayed complications related to valve repair or replacement including but not limited to structural valve deterioration and failure, thrombosis, embolization, endocarditis, or paravalvular leak. Alternative surgical approaches have been discussed including a comparison between conventional sternotomy and minimally-invasive techniques. The relative risks and benefits of each have been reviewed as they pertain to the patient's specific circumstances, and all of their questions have been addressed. Specific risks potentially related to the minimally-invasive approach were discussed at length, including but not limited to risk of conversion to full or partial sternotomy, aortic dissection or other major vascular complication, unilateral acute lung injury or pulmonary edema, phrenic nerve dysfunction or paralysis, rib fracture, chronic pain, lung hernia, or lymphocele. All of her questions have been answered.  I spent in excess of 15 minutes  during the conduct of this office consultation and >50% of this time involved direct face-to-face encounter with the patient for counseling and/or coordination of their care.   Valentina Gu. Roxy Manns, MD 06/26/2015 3:39 PM

## 2015-06-28 NOTE — Transfer of Care (Signed)
Immediate Anesthesia Transfer of Care Note  Patient: Tracy Chen  Procedure(s) Performed: Procedure(s) with comments: MINIMALLY INVASIVE MITRAL VALVE REPAIR (MVR) (Right) - 51 Sorin Memo 3D Ring TRANSESOPHAGEAL ECHOCARDIOGRAM (TEE) (N/A)  Patient Location: SICU  Anesthesia Type:General  Level of Consciousness: Patient remains intubated per anesthesia plan  Airway & Oxygen Therapy: Patient remains intubated per anesthesia plan  Post-op Assessment: Report given to RN and Post -op Vital signs reviewed and stable  Post vital signs: Reviewed and stable  Last Vitals:  Filed Vitals:   06/28/15 0550 06/28/15 1420  BP: 139/68 111/59  Pulse: 81 79  Temp: 36.9 C   Resp: 20 12    Complications: No apparent anesthesia complications

## 2015-06-28 NOTE — Brief Op Note (Addendum)
06/28/2015  12:37 PM  PATIENT:  Tracy Chen  60 y.o. 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  MV Stenosis: No mitral valve stenosis.  MV Disease Functional Class: MV Disease Functional Class: Type II.  Etiology (Choose at least one and up to five): Degenerative.  MV Lesions (Choose at least one): Leaflet prolapse, posterior.   Mitral/Tricuspid/Pulmonary Valve Procedure  Mitral Valve Procedure Performed:  Repair: Annuloplasty., Leaflet Resection. Resection Type:Triangular. Mitral Leaflet Resection Location: Posterior. and Neochrods. Number of Neochords Inserted: 2 pairs (4 total neo chords)  Implant: Annuloplasty Device: Implant model number [id number], Size 32, Unique Device Identifier [id number].  Tricuspid Valve Procedure Performed:  N/A  Implant: N/A  Pulmonary Valve Procedure Performed:N/A  Implant: N/A        COMPLICATIONS: None  BASELINE WEIGHT: 71.6 kg  PATIENT DISPOSITION:   TO SICU IN STABLE CONDITION  Rexene Alberts, MD 06/28/2015 2:04 PM

## 2015-06-29 ENCOUNTER — Inpatient Hospital Stay (HOSPITAL_COMMUNITY): Payer: BLUE CROSS/BLUE SHIELD

## 2015-06-29 ENCOUNTER — Encounter (HOSPITAL_COMMUNITY): Payer: Self-pay | Admitting: Thoracic Surgery (Cardiothoracic Vascular Surgery)

## 2015-06-29 LAB — POCT I-STAT, CHEM 8
BUN: 10 mg/dL (ref 6–20)
CALCIUM ION: 1.19 mmol/L (ref 1.12–1.23)
Chloride: 101 mmol/L (ref 101–111)
Creatinine, Ser: 0.5 mg/dL (ref 0.44–1.00)
GLUCOSE: 122 mg/dL — AB (ref 65–99)
HCT: 30 % — ABNORMAL LOW (ref 36.0–46.0)
Hemoglobin: 10.2 g/dL — ABNORMAL LOW (ref 12.0–15.0)
Potassium: 4.1 mmol/L (ref 3.5–5.1)
SODIUM: 137 mmol/L (ref 135–145)
TCO2: 26 mmol/L (ref 0–100)

## 2015-06-29 LAB — BASIC METABOLIC PANEL
ANION GAP: 6 (ref 5–15)
BUN: 7 mg/dL (ref 6–20)
CHLORIDE: 110 mmol/L (ref 101–111)
CO2: 22 mmol/L (ref 22–32)
Calcium: 7.8 mg/dL — ABNORMAL LOW (ref 8.9–10.3)
Creatinine, Ser: 0.6 mg/dL (ref 0.44–1.00)
GFR calc non Af Amer: >60 mL/min (ref >60)
Glucose, Bld: 137 mg/dL — ABNORMAL HIGH (ref 65–99)
POTASSIUM: 3.9 mmol/L (ref 3.5–5.1)
Sodium: 138 mmol/L (ref 135–145)

## 2015-06-29 LAB — GLUCOSE, CAPILLARY
GLUCOSE-CAPILLARY: 116 mg/dL — AB (ref 65–99)
GLUCOSE-CAPILLARY: 113 mg/dL — AB (ref 65–99)
GLUCOSE-CAPILLARY: 115 mg/dL — AB (ref 65–99)
GLUCOSE-CAPILLARY: 112 mg/dL — AB (ref 65–99)
Glucose-Capillary: 120 mg/dL — ABNORMAL HIGH (ref 65–99)

## 2015-06-29 LAB — CBC
HEMATOCRIT: 29.1 % — AB (ref 36.0–46.0)
HEMATOCRIT: 30.3 % — AB (ref 36.0–46.0)
HEMOGLOBIN: 9.6 g/dL — AB (ref 12.0–15.0)
Hemoglobin: 10 g/dL — ABNORMAL LOW (ref 12.0–15.0)
MCH: 28.8 pg (ref 26.0–34.0)
MCH: 29.1 pg (ref 26.0–34.0)
MCHC: 33 g/dL (ref 30.0–36.0)
MCHC: 33 g/dL (ref 30.0–36.0)
MCV: 87.4 fL (ref 78.0–100.0)
MCV: 88.1 fL (ref 78.0–100.0)
PLATELETS: 172 10*3/uL (ref 150–400)
Platelets: 191 10*3/uL (ref 150–400)
RBC: 3.33 MIL/uL — ABNORMAL LOW (ref 3.87–5.11)
RBC: 3.44 MIL/uL — ABNORMAL LOW (ref 3.87–5.11)
RDW: 14.4 % (ref 11.5–15.5)
RDW: 14.8 % (ref 11.5–15.5)
WBC: 15.8 10*3/uL — AB (ref 4.0–10.5)
WBC: 14.7 10*3/uL — AB (ref 4.0–10.5)

## 2015-06-29 LAB — CREATININE, SERUM
Creatinine, Ser: 0.57 mg/dL (ref 0.44–1.00)
GFR calc non Af Amer: >60 mL/min (ref >60)

## 2015-06-29 LAB — MAGNESIUM
Magnesium: 2.4 mg/dL (ref 1.7–2.4)
Magnesium: 2.2 mg/dL (ref 1.7–2.4)

## 2015-06-29 MED ORDER — WARFARIN SODIUM 2.5 MG PO TABS
2.5000 mg | ORAL_TABLET | Freq: Every day | ORAL | Status: DC
  Administered 2015-06-29 – 2015-07-01 (×3): 2.5 mg via ORAL
  Filled 2015-06-29 (×3): qty 1

## 2015-06-29 MED ORDER — AMIODARONE HCL 200 MG PO TABS: 200.0000 mg | ORAL_TABLET | Freq: Every day | ORAL | Status: DC

## 2015-06-29 MED ORDER — MORPHINE SULFATE (PF) 2 MG/ML IV SOLN: 1.0000 mg | INTRAVENOUS | Status: DC | PRN

## 2015-06-29 MED ORDER — WARFARIN - PHYSICIAN DOSING INPATIENT
Freq: Every day | Status: DC
  Administered 2015-06-30: 18:00:00

## 2015-06-29 MED ORDER — LACTATED RINGERS IV SOLN: INTRAVENOUS | Status: DC

## 2015-06-29 MED FILL — Lidocaine HCl IV Inj 20 MG/ML: INTRAVENOUS | Qty: 5 | Status: AC

## 2015-06-29 MED FILL — Mannitol IV Soln 20%: INTRAVENOUS | Qty: 500 | Status: AC

## 2015-06-29 MED FILL — Sodium Chloride IV Soln 0.9%: INTRAVENOUS | Qty: 2000 | Status: AC

## 2015-06-29 MED FILL — Electrolyte-R (PH 7.4) Solution: INTRAVENOUS | Qty: 3000 | Status: AC

## 2015-06-29 MED FILL — Heparin Sodium (Porcine) Inj 1000 Unit/ML: INTRAMUSCULAR | Qty: 10 | Status: AC

## 2015-06-29 MED FILL — Sodium Bicarbonate IV Soln 8.4%: INTRAVENOUS | Qty: 50 | Status: AC

## 2015-06-29 NOTE — Progress Notes (Signed)
T[facility] BRIEF SICU PROGRESS NOTE  1 Day Post-Op  S/P Procedure(s) (LRB): MINIMALLY INVASIVE MITRAL VALVE REPAIR (MVR) (Right) TRANSESOPHAGEAL ECHOCARDIOGRAM (TEE) (N/A)   Doing well Minimal pain Mild nausea Maintaining NSR w/ stable BP Breathing comfortably w/ O2 sats  100% UOP adequate  Plan: Continue routine care  Rexene Alberts, MD 06/29/2015 6:45 PM

## 2015-06-29 NOTE — Progress Notes (Addendum)
[address]       [place] [address]             [phone]        CARDIOTHORACIC SURGERY PROGRESS NOTE   R1 Day Post-Op Procedure(s) (LRB): MINIMALLY INVASIVE MITRAL VALVE REPAIR (MVR) (Right) TRANSESOPHAGEAL ECHOCARDIOGRAM (TEE) (N/A)  Subjective: Looks good and feels well.  Denies pain or SOB.  Mild dizziness and nausea when she stood to walk  Objective: Vital signs: BP Readings from Last 1 Encounters:  06/29/15 88/45   Pulse Readings from Last 1 Encounters:  06/29/15 80   Resp Readings from Last 1 Encounters:  06/29/15 23   Temp Readings from Last 1 Encounters:  06/29/15 98.6 F (37 C)     Hemodynamics: PAP: (21-36)/(8-20) 34/18 mmHg CO:  [3.5 L/min-6.3 L/min] 6.3 L/min CI:  [1.9 L/min/m2-3.5 L/min/m2] 3.5 L/min/m2  Physical Exam:  Rhythm:   sinus  Breath sounds: clear  Heart sounds:  RRR w/out murmur  Incisions:  Dressings dry, intact  Abdomen:  Soft, non-distended, non-tender  Extremities:  Warm, well-perfused  Chest tubes:  Low volume thin serosanguinous output, no air leak    Intake/Output from previous day: 04/20 0701 - 04/21 0700 In: 5253 [I.V.:4260; Blood:263; NG/GT:30; IV Piggyback:700] Out: 3720 [Urine:2755; Blood:525; Chest Tube:440] Intake/Output this shift:    Lab Results:  CBC: Recent Labs  06/28/15 2000 06/28/15 2010 06/29/15 0352  WBC 15.3*  --  15.8*  HGB 10.1* 9.9* 9.6*  HCT 30.3* 29.0* 29.1*  PLT 177  --  191    BMET:  Recent Labs  06/26/15 1230  06/28/15 2010 06/29/15 0352  NA 138  < > 140 138  K 3.4*  < > 4.2 3.9  CL 104  < > 107 110  CO2 21*  --   --  22  GLUCOSE 114*  < > 146* 137*  BUN 7  < > 9 7  CREATININE 0.67  < > 0.50 0.60  CALCIUM 9.5  --   --  7.8*  < > = values in this interval not displayed.   PT/INR:   Recent Labs  06/28/15 1435  LABPROT 17.8*  INR 1.46    CBG (last 3)   Recent Labs  06/28/15 1927 06/28/15 2353 06/29/15 0358  GLUCAP 130* 120* 116*     ABG    Component Value Date/Time   PHART 7.282* 06/28/2015 1915   PCO2ART 46.7* 06/28/2015 1915   PO2ART 89.0 06/28/2015 1915   HCO3 22.1 06/28/2015 1915   TCO2 22 06/28/2015 2010   ACIDBASEDEF 5.0* 06/28/2015 1915   O2SAT 96.0 06/28/2015 1915    CXR: PORTABLE CHEST 1 VIEW  COMPARISON: June 28, 2015  FINDINGS: Endotracheal tube and nasogastric tube have been removed. Swan-Ganz catheter tip is in the right main pulmonary artery. Right chest tube position and mediastinal drain unchanged. 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 No pneumothorax. There is persistent atelectatic change in the right mid and lower lung zones, stable. There is a minimal right pleural effusion. Left lung is clear. Heart is borderline enlarged with pulmonary vascularity within normal limits. Patient is status post mitral valve replacement. There are surgical clips in left axillary region.  IMPRESSION: Endotracheal tube and nasogastric tube have been removed. Other tube and catheter positions are unchanged. No pneumothorax. Patchy atelectasis in the right mid and lower lung zones remains stable as does minimal right pleural effusion.   Electronically Signed  By: Lowella Grip III  M.D.  On: 06/29/2015 07:42   Assessment/Plan: S/P Procedure(s) (LRB): MINIMALLY INVASIVE MITRAL VALVE REPAIR (MVR) (Right) TRANSESOPHAGEAL ECHOCARDIOGRAM (TEE) (N/A)  Doing well POD1 Maintaining NSR w/ stable hemodynamics, no drips Breathing comfortably w/ O2 sats 96-98% on 2 L/min Expected post op acute blood loss anemia, Hgb stable 9.6 this am Expected post op volume excess, mild Expected post op atelectasis, mild, R>L   Mobilize  D/C lines   Leave chest tubes in for now  Start coumadin slowly   Rexene Alberts, MD 06/29/2015 7:59 AM

## 2015-06-29 NOTE — Care Management Note (Signed)
Case Management Note  Patient Details  Name: Tracy Chen MRN: [id number] Date of Birth: December 19, 1955  Subjective/Objective:    Pt lives with spouse who will take FMLA to provide 24/7 assistance to her when she is medcally stable for discharge.                        Expected Discharge Plan:  Home/Self Care  Discharge planning Services  CM Consult  Status of Service:  In process, will continue to follow  Girard Cooter, RN 06/29/2015, 2:14 PM

## 2015-06-29 NOTE — Anesthesia Postprocedure Evaluation (Signed)
Anesthesia Post Note  Patient: Henreitta Cea  Procedure(s) Performed: Procedure(s) (LRB): MINIMALLY INVASIVE MITRAL VALVE REPAIR (MVR) (Right) TRANSESOPHAGEAL ECHOCARDIOGRAM (TEE) (N/A)  Patient location during evaluation: SICU Anesthesia Type: General Level of consciousness: awake and alert, oriented and patient cooperative Pain management: pain level controlled Vital Signs Assessment: post-procedure vital signs reviewed and stable Respiratory status: spontaneous breathing, nonlabored ventilation, respiratory function stable and patient connected to nasal cannula oxygen Cardiovascular status: blood pressure returned to baseline and stable Postop Assessment: no signs of nausea or vomiting and adequate PO intake Anesthetic complications: no    Last Vitals:  Filed Vitals:   06/29/15 0600 06/29/15 0700  BP: 91/56 88/45  Pulse: 79 80  Temp: 37.1 C 37 C  Resp: 20 23    Last Pain:  Filed Vitals:   06/29/15 0743  PainSc: Asleep                 Couper Juncaj,E. Lindsey Hommel

## 2015-06-30 ENCOUNTER — Inpatient Hospital Stay (HOSPITAL_COMMUNITY): Payer: BLUE CROSS/BLUE SHIELD

## 2015-06-30 LAB — CBC
HEMATOCRIT: 29 % — AB (ref 36.0–46.0)
Hemoglobin: 9.6 g/dL — ABNORMAL LOW (ref 12.0–15.0)
MCH: 29.4 pg (ref 26.0–34.0)
MCHC: 33.1 g/dL (ref 30.0–36.0)
MCV: 88.7 fL (ref 78.0–100.0)
Platelets: 167 10*3/uL (ref 150–400)
RBC: 3.27 MIL/uL — ABNORMAL LOW (ref 3.87–5.11)
RDW: 15 % (ref 11.5–15.5)
WBC: 12.4 10*3/uL — AB (ref 4.0–10.5)

## 2015-06-30 LAB — BASIC METABOLIC PANEL
Anion gap: 7 (ref 5–15)
BUN: 9 mg/dL (ref 6–20)
CHLORIDE: 103 mmol/L (ref 101–111)
CO2: 26 mmol/L (ref 22–32)
Calcium: 8.4 mg/dL — ABNORMAL LOW (ref 8.9–10.3)
Creatinine, Ser: 0.56 mg/dL (ref 0.44–1.00)
GFR calc non Af Amer: >60 mL/min (ref >60)
Glucose, Bld: 109 mg/dL — ABNORMAL HIGH (ref 65–99)
POTASSIUM: 4.3 mmol/L (ref 3.5–5.1)
Sodium: 136 mmol/L (ref 135–145)

## 2015-06-30 LAB — PROTIME-INR
INR: 1.24 (ref 0.00–1.49)
Prothrombin Time: 15.8 seconds — ABNORMAL HIGH (ref 11.6–15.2)

## 2015-06-30 MED ORDER — SODIUM CHLORIDE 0.9% FLUSH
3.0000 mL | Freq: Two times a day (BID) | INTRAVENOUS | Status: DC
  Administered 2015-06-30 – 2015-07-01 (×4): 3 mL via INTRAVENOUS

## 2015-06-30 MED ORDER — MOVING RIGHT ALONG BOOK
Freq: Once | Status: AC
  Administered 2015-06-30: 08:00:00
  Filled 2015-06-30: qty 1

## 2015-06-30 MED ORDER — POTASSIUM CHLORIDE CRYS ER 20 MEQ PO TBCR: 20.0000 meq | EXTENDED_RELEASE_TABLET | Freq: Every day | ORAL | Status: DC

## 2015-06-30 MED ORDER — ASPIRIN EC 81 MG PO TBEC
81.0000 mg | DELAYED_RELEASE_TABLET | Freq: Every day | ORAL | Status: DC
  Administered 2015-06-30 – 2015-07-02 (×3): 81 mg via ORAL
  Filled 2015-06-30 (×3): qty 1

## 2015-06-30 MED ORDER — FUROSEMIDE 40 MG PO TABS
40.0000 mg | ORAL_TABLET | Freq: Every day | ORAL | Status: AC
  Administered 2015-06-30 – 2015-07-01 (×2): 40 mg via ORAL
  Filled 2015-06-30 (×2): qty 1

## 2015-06-30 MED ORDER — SODIUM CHLORIDE 0.9% FLUSH: 3.0000 mL | INTRAVENOUS | Status: DC | PRN

## 2015-06-30 MED ORDER — SODIUM CHLORIDE 0.9 % IV SOLN: 250.0000 mL | INTRAVENOUS | Status: DC | PRN

## 2015-06-30 NOTE — Progress Notes (Signed)
Patient ambulated to bathroom.  Upon arrival to bathroom, patient stated she was dizzy, went into a cold sweat and passed out for 5-10 sec. before coming around.  Unable to see rhythm on monitor during this due to patient going into cold sweat and coming off from the monitor.  BP after coming around is 105/62.  Patient very pale, diaphoretic and cool to touch.  Dr. Roxy Manns notified.

## 2015-06-30 NOTE — Progress Notes (Signed)
      [address]       ,[place] [address]             [phone]        CARDIOTHORACIC SURGERY PROGRESS NOTE   R2 Days Post-Op Procedure(s) (LRB): MINIMALLY INVASIVE MITRAL VALVE REPAIR (MVR) (Right) TRANSESOPHAGEAL ECHOCARDIOGRAM (TEE) (N/A)  Subjective: Looks good and feels well.  No pain, SOB  Objective: Vital signs: BP Readings from Last 1 Encounters:  06/30/15 126/51   Pulse Readings from Last 1 Encounters:  06/30/15 69   Resp Readings from Last 1 Encounters:  06/30/15 18   Temp Readings from Last 1 Encounters:  06/30/15 98.1 F (36.7 C) Oral    Hemodynamics: PAP: (31)/(14) 31/14 mmHg  Physical Exam:  Rhythm:   sinus  Breath sounds: clear  Heart sounds:  RRR w/out murmur  Incisions:  Dressing dry, intact  Abdomen:  Soft, non-distended, non-tender  Extremities:  Warm, well-perfused  Chest tubes:  decreasing volume thin serosanguinous output, no air leak     Intake/Output from previous day: 04/21 0701 - 04/22 0700 In: 760 [P.O.:480; I.V.:180; IV Piggyback:100] Out: 1095 [Urine:705; Chest Tube:390] Intake/Output this shift:    Lab Results:  CBC: Recent Labs  06/29/15 1915 06/29/15 1920 06/30/15 0416  WBC 14.7*  --  12.4*  HGB 10.0* 10.2* 9.6*  HCT 30.3* 30.0* 29.0*  PLT 172  --  167    BMET:  Recent Labs  06/29/15 0352  06/29/15 1920 06/30/15 0416  NA 138  --  137 136  K 3.9  --  4.1 4.3  CL 110  --  101 103  CO2 22  --   --  26  GLUCOSE 137*  --  122* 109*  BUN 7  --  10 9  CREATININE 0.60  < > 0.50 0.56  CALCIUM 7.8*  --   --  8.4*  < > = values in this interval not displayed.   PT/INR:   Recent Labs  06/30/15 0416  LABPROT 15.8*  INR 1.24    CBG (last 3)   Recent Labs  06/29/15 0805 06/29/15 1159 06/29/15 1638  GLUCAP 113* 115* 112*    ABG    Component Value Date/Time   PHART 7.282* 06/28/2015 1915   PCO2ART 46.7* 06/28/2015 1915   PO2ART 89.0 06/28/2015 1915   HCO3 22.1 06/28/2015  1915   TCO2 26 06/29/2015 1920   ACIDBASEDEF 5.0* 06/28/2015 1915   O2SAT 96.0 06/28/2015 1915    CXR: Mild bibasilar atelectasis R>L  Assessment/Plan: S/P Procedure(s) (LRB): MINIMALLY INVASIVE MITRAL VALVE REPAIR (MVR) (Right) TRANSESOPHAGEAL ECHOCARDIOGRAM (TEE) (N/A)  Doing well POD2 Maintaining NSR w/ stable BP Breathing comfortably w/ O2 sats 96-98% on 2 L/min Expected post op acute blood loss anemia Expected post op volume excess, mild Expected post op atelectasis, mild, R>L   Mobilize  D/C lines   Leave chest tubes in for now  Coumadin  Transfer step down  Rexene Alberts, MD 06/30/2015 7:53 AM

## 2015-06-30 NOTE — Progress Notes (Signed)
T[facility] BRIEF SICU PROGRESS NOTE  2 Days Post-Op  S/P Procedure(s) (LRB): MINIMALLY INVASIVE MITRAL VALVE REPAIR (MVR) (Right) TRANSESOPHAGEAL ECHOCARDIOGRAM (TEE) (N/A)   Patient had a brief syncopal episode when she stood to go to bathroom earlier today No associated change in rhythm noted, and BP stable prior to and ever since  Plan: Continue current plan  Rexene Alberts, MD 06/30/2015 6:46 PM

## 2015-07-01 ENCOUNTER — Inpatient Hospital Stay (HOSPITAL_COMMUNITY): Payer: BLUE CROSS/BLUE SHIELD

## 2015-07-01 LAB — CBC
HCT: 30.6 % — ABNORMAL LOW (ref 36.0–46.0)
Hemoglobin: 10 g/dL — ABNORMAL LOW (ref 12.0–15.0)
MCH: 29.1 pg (ref 26.0–34.0)
MCHC: 32.7 g/dL (ref 30.0–36.0)
MCV: 89 fL (ref 78.0–100.0)
PLATELETS: 217 10*3/uL (ref 150–400)
RBC: 3.44 MIL/uL — AB (ref 3.87–5.11)
RDW: 14.8 % (ref 11.5–15.5)
WBC: 12.7 10*3/uL — AB (ref 4.0–10.5)

## 2015-07-01 LAB — PROTIME-INR
INR: 1.18 (ref 0.00–1.49)
Prothrombin Time: 15.2 seconds (ref 11.6–15.2)

## 2015-07-01 LAB — BASIC METABOLIC PANEL
ANION GAP: 11 (ref 5–15)
BUN: 9 mg/dL (ref 6–20)
CALCIUM: 8.8 mg/dL — AB (ref 8.9–10.3)
CO2: 28 mmol/L (ref 22–32)
Chloride: 100 mmol/L — ABNORMAL LOW (ref 101–111)
Creatinine, Ser: 0.67 mg/dL (ref 0.44–1.00)
GLUCOSE: 95 mg/dL (ref 65–99)
POTASSIUM: 3.6 mmol/L (ref 3.5–5.1)
SODIUM: 139 mmol/L (ref 135–145)

## 2015-07-01 MED ORDER — POTASSIUM CHLORIDE CRYS ER 20 MEQ PO TBCR: 20.0000 meq | EXTENDED_RELEASE_TABLET | ORAL | Status: DC | PRN

## 2015-07-01 MED ORDER — POTASSIUM CHLORIDE CRYS ER 20 MEQ PO TBCR: 40.0000 meq | EXTENDED_RELEASE_TABLET | Freq: Once | ORAL | Status: DC

## 2015-07-01 MED ORDER — POTASSIUM CHLORIDE CRYS ER 20 MEQ PO TBCR
20.0000 meq | EXTENDED_RELEASE_TABLET | Freq: Every day | ORAL | Status: DC
  Administered 2015-07-01 – 2015-07-02 (×2): 20 meq via ORAL
  Filled 2015-07-01 (×2): qty 1

## 2015-07-01 MED ORDER — HYDROCHLOROTHIAZIDE 25 MG PO TABS
25.0000 mg | ORAL_TABLET | Freq: Every day | ORAL | Status: DC
  Administered 2015-07-02: 25 mg via ORAL
  Filled 2015-07-01: qty 1

## 2015-07-01 NOTE — Progress Notes (Signed)
      [address]       [place] [address]             [phone]        CARDIOTHORACIC SURGERY PROGRESS NOTE   R3 Days Post-Op Procedure(s) (LRB): MINIMALLY INVASIVE MITRAL VALVE REPAIR (MVR) (Right) TRANSESOPHAGEAL ECHOCARDIOGRAM (TEE) (N/A)  Subjective: Looks good and feels much better.  Ambulated already this morning and hungry for breakfast  Objective: Vital signs: BP Readings from Last 1 Encounters:  07/01/15 142/79   Pulse Readings from Last 1 Encounters:  07/01/15 73   Resp Readings from Last 1 Encounters:  07/01/15 14   Temp Readings from Last 1 Encounters:  07/01/15 98.1 F (36.7 C) Oral    Hemodynamics:    Physical Exam:  Rhythm:   sinus  Breath sounds: clear  Heart sounds:  RRR w/out murmur  Incisions:  Clean and dry  Abdomen:  Soft, non-distended, non-tender  Extremities:  Warm, well-perfused  Chest tubes:  Low volume thin serosanguinous output, no air leak    Intake/Output from previous day: 04/22 0701 - 04/23 0700 In: 200 [P.O.:200] Out: 2490 [Urine:2150; Chest Tube:340] Intake/Output this shift:    Lab Results:  CBC: Recent Labs  06/30/15 0416 07/01/15 0447  WBC 12.4* 12.7*  HGB 9.6* 10.0*  HCT 29.0* 30.6*  PLT 167 217    BMET:  Recent Labs  06/30/15 0416 07/01/15 0447  NA 136 139  K 4.3 3.6  CL 103 100*  CO2 26 28  GLUCOSE 109* 95  BUN 9 9  CREATININE 0.56 0.67  CALCIUM 8.4* 8.8*     PT/INR:   Recent Labs  07/01/15 0447  LABPROT 15.2  INR 1.18    CBG (last 3)   Recent Labs  06/29/15 0805 06/29/15 1159 06/29/15 1638  GLUCAP 113* 115* 112*    ABG    Component Value Date/Time   PHART 7.282* 06/28/2015 1915   PCO2ART 46.7* 06/28/2015 1915   PO2ART 89.0 06/28/2015 1915   HCO3 22.1 06/28/2015 1915   TCO2 26 06/29/2015 1920   ACIDBASEDEF 5.0* 06/28/2015 1915   O2SAT 96.0 06/28/2015 1915    CXR: clear  Assessment/Plan: S/P Procedure(s) (LRB): MINIMALLY INVASIVE MITRAL VALVE  REPAIR (MVR) (Right) TRANSESOPHAGEAL ECHOCARDIOGRAM (TEE) (N/A)  Doing well POD3 Maintaining NSR w/ stable BP Breathing comfortably w/ O2 sats 98-100% on 2 L/min Expected post op acute blood loss anemia Expected post op volume excess, mild Expected post op atelectasis, mild, R>L   Mobilize  D/C pacing wires and chest tubes   Coumadin  Transfer 2W  Possible d/c home 1-2 days  Rexene Alberts, MD 07/01/2015 8:55 AM

## 2015-07-01 NOTE — Progress Notes (Addendum)
In to pull patient epicardial pacer wires at this time.  Patient layed flat and pacer wires pulled per protocol. Noted both tips intact (atrial and ventricular) wires upon removal.  Patient throughout the procedure had no noted arrhythmias and vital signs taken q 5 min and stable. Patient tolerated the procedure well with no complications. Patient to remain flat per protocol and will continue to monitor closely.    Dustin Folks RN, BSN, CCRN

## 2015-07-02 ENCOUNTER — Inpatient Hospital Stay (HOSPITAL_COMMUNITY): Payer: BLUE CROSS/BLUE SHIELD

## 2015-07-02 LAB — PROTIME-INR
INR: 1.23 (ref 0.00–1.49)
Prothrombin Time: 15.6 seconds — ABNORMAL HIGH (ref 11.6–15.2)

## 2015-07-02 MED ORDER — METOPROLOL TARTRATE 25 MG PO TABS
12.5000 mg | ORAL_TABLET | Freq: Two times a day (BID) | ORAL | Status: DC
Start: 2015-07-02 — End: 2015-08-13

## 2015-07-02 MED ORDER — WARFARIN SODIUM 2.5 MG PO TABS: 2.5000 mg | ORAL_TABLET | Freq: Every day | ORAL | Status: DC

## 2015-07-02 MED ORDER — OXYCODONE HCL 5 MG PO TABS: 5.0000 mg | ORAL_TABLET | ORAL | Status: DC | PRN

## 2015-07-02 MED ORDER — COUMADIN BOOK
Freq: Once | Status: DC
  Filled 2015-07-02: qty 1

## 2015-07-02 MED ORDER — ASPIRIN 81 MG PO TBEC: 81.0000 mg | DELAYED_RELEASE_TABLET | Freq: Every day | ORAL | Status: DC

## 2015-07-02 NOTE — Progress Notes (Signed)
      [address]       [place] [address]             [phone]        CARDIOTHORACIC SURGERY PROGRESS NOTE   R4 Days Post-Op Procedure(s) (LRB): MINIMALLY INVASIVE MITRAL VALVE REPAIR (MVR) (Right) TRANSESOPHAGEAL ECHOCARDIOGRAM (TEE) (N/A)  Subjective: Looks good.  Had a good night.  Never got a bed on tele unit - still in ICU  Objective: Vital signs: BP Readings from Last 1 Encounters:  07/02/15 145/73   Pulse Readings from Last 1 Encounters:  07/02/15 80   Resp Readings from Last 1 Encounters:  07/02/15 24   Temp Readings from Last 1 Encounters:  07/02/15 98.7 F (37.1 C) Oral    Hemodynamics:    Physical Exam:  Rhythm:   sinus  Breath sounds: clear  Heart sounds:  RRR w/out murmur  Incisions:  Clean and dry  Abdomen:  Soft, non-distended, non-tender  Extremities:  Warm, well-perfused    Intake/Output from previous day: 04/23 0701 - 04/24 0700 In: 726 [P.O.:720; I.V.:6] Out: 630 [Urine:600; Chest Tube:30] Intake/Output this shift:    Lab Results:  CBC: Recent Labs  06/30/15 0416 07/01/15 0447  WBC 12.4* 12.7*  HGB 9.6* 10.0*  HCT 29.0* 30.6*  PLT 167 217    BMET:  Recent Labs  06/30/15 0416 07/01/15 0447  NA 136 139  K 4.3 3.6  CL 103 100*  CO2 26 28  GLUCOSE 109* 95  BUN 9 9  CREATININE 0.56 0.67  CALCIUM 8.4* 8.8*     PT/INR:   Recent Labs  07/02/15 0235  LABPROT 15.6*  INR 1.23    CBG (last 3)   Recent Labs  06/29/15 1159 06/29/15 1638  GLUCAP 115* 112*    ABG    Component Value Date/Time   PHART 7.282* 06/28/2015 1915   PCO2ART 46.7* 06/28/2015 1915   PO2ART 89.0 06/28/2015 1915   HCO3 22.1 06/28/2015 1915   TCO2 26 06/29/2015 1920   ACIDBASEDEF 5.0* 06/28/2015 1915   O2SAT 96.0 06/28/2015 1915    CXR: CHEST 2 VIEW  COMPARISON: 07/01/2015  FINDINGS: Cardiomegaly. Prior valve replacement. Bilateral lower lobe airspace opacities, increased since prior study. Small  bilateral effusions. No acute bony abnormality.  IMPRESSION: Increasing bibasilar atelectasis or infiltrates with small effusions.   Electronically Signed  By: Rolm Baptise M.D.  On: 07/02/2015 07:33   Assessment/Plan: S/P Procedure(s) (LRB): MINIMALLY INVASIVE MITRAL VALVE REPAIR (MVR) (Right) TRANSESOPHAGEAL ECHOCARDIOGRAM (TEE) (N/A)  Doing well POD4 Maintaining NSR w/ stable BP Breathing comfortably w/ O2 sats 98-100% on RA Expected post op acute blood loss anemia Expected post op volume excess, mild Expected post op atelectasis, mild, R>L   D/C home today  Instructions given   Rexene Alberts, MD 07/02/2015 8:29 AM

## 2015-07-02 NOTE — Progress Notes (Signed)
Handed pt off to transporter to go to Xray for scheduled 2-view. Pt kept on telemetry monitor for transport by wheelchair, but RN not accompanying d/t pt stable condition for >24hrs and low fall risk.   Henreitta Leber, RN 5:57 AM 07/02/2015

## 2015-07-02 NOTE — Progress Notes (Signed)
Sutures under right breast removed.  Small amount of oozing noted.  Gauze applied.  D/C care of incisions discussed.  Coumadin and incision instructions discussed in length.  Coumadin booklet reviewed and discussed with pt.  Pt verbalizes understanding of D/C incisional care, follow up appts, and Coumadin.

## 2015-07-02 NOTE — Progress Notes (Signed)
[id number] Notified by surgeon and staff that pt for discharge. Education completed with pt who voiced understanding. Encouraged IS. Pt is going to be on Coumadin. Asked RN to have pharmacy see pt before discharge. Discussed briefly with pt how Vitamin K found in green leafy vegetables affects Coumadin. Wrote down for RN how to set up discharge video and Coumadin video for pt after breakfast. Pt has been walking with staff and did not want to walk right now as just finishing breakfast. Discussed CRP 2 and will refer to [place] since she works in [employer]. Graylon Good RN BSN 07/02/2015 9:39 AM

## 2015-07-02 NOTE — Discharge Summary (Signed)
Physician Discharge Summary  Patient ID: Tracy Chen MRN: [id number] DOB/AGE: [birth year] 60 y.o.  Admit date: 06/28/2015 Discharge date: 07/02/2015  Admission Diagnoses:  Patient Active Problem List   Diagnosis Date Noted  . 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 Mitral valve prolapse 02/21/2015  . Severe mitral regurgitation 02/21/2015  . White coat hypertension 11/17/2013  . Skin lesion 11/17/2013  . Visit for preventive health examination 11/17/2013  . Special screening for malignant neoplasms, colon 11/12/2012  . HX: breast cancer   . Routine gynecological examination 09/28/2010  . Preventative health care 09/24/2010  . Disorder of bone and cartilage 03/31/2007  . HX OF GALLSTONE 03/31/2007  . TRANSAMINASES, SERUM, ELEVATED 12/08/2006  . ALLERGIC RHINITIS 11/05/2006  . OSTEOPOROSIS 11/05/2006   Discharge Diagnoses:   Patient Active Problem List   Diagnosis Date Noted  . S/P minimally invasive mitral valve repair 06/28/2015  . 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 Mitral valve prolapse 02/21/2015  . Severe mitral regurgitation 02/21/2015  . White coat hypertension 11/17/2013  . Skin lesion 11/17/2013  . Visit for preventive health examination 11/17/2013  . Special screening for malignant neoplasms, colon 11/12/2012  . HX: breast cancer   . Routine gynecological examination 09/28/2010  . Preventative health care 09/24/2010  . Disorder of bone and cartilage 03/31/2007  . HX OF GALLSTONE 03/31/2007  . TRANSAMINASES, SERUM, ELEVATED 12/08/2006  . ALLERGIC RHINITIS 11/05/2006  . OSTEOPOROSIS 11/05/2006   Discharged Condition: good  History of Present Illness:  Tracy Chen is a 60 year old female with no previous cardiac history who was recently found to have a prominent systolic murmur on routine physical examination by her primary care physician. A transthoracic echocardiogram was performed demonstrating the presence of mitral  valve prolapse with severe mitral regurgitation and normal left ventricular systolic function. The patient was referred to Dr. Acie Fredrickson for consultation and underwent transesophageal echocardiogram on 04/25/2015. This confirmed the presence of mitral valve prolapse with a flail segment of the posterior leaflet and severe mitral regurgitation. Left ventricular systolic function remains preserved.  The patient has been physically active all her life.  She was diagnosed with breast cancer and treated 15 years ago.  She continues to exercise on a regular basis.  She denies exertional shortness of breath or chest discomfort.  She has not noticed any significant change in her exercise tolerance in recent years. She denies any history of resting shortness of breath, PND, orthopnea, palpitations, dizzy spells, or syncope.  Due to her Echocardiogram findings she was referred to [facility] for surgical evaluation.  She was initially evaluated by Dr. Roxy Manns on 05/07/2015 at which time the types of surgery were explained to the patient and she was agreeable to proceed.  However she would require cardiac catheterization prior to proceeding.  This was done by Dr. Martinique on 05/29/2015 and was not found to have CAD.    Hospital Course:   Tracy Chen presented to [facility] on 06/28/2015.  She was taken to the operating room and underwent Minimally Invasive Mitral Valve Repair with triangular resection of the flail segment of posterior leaflet, placement of Gore-tex Neochords x4, closure of cleft between P2 and P3, and Ring Annuloplasty with a Sorin Memo 3D 32 mm Ring.  She tolerated the procedure without difficulty and was taken to the SICU in stable condition.  She was extubated the evening of surgery.  During her stay in the SICU the patient maintained NSR.  She was started on  low dose Coumadin for stroke prophylaxis.  Her chest tubes and arterial lines were removed without difficulty.  She was ambulating with minimal assistance.   However, she suffered a syncopal episode while walking to the bathroom.  Her telemetry monitoring was reviewed and there was no arrythmia present.  Her blood pressure was also stable.  She continued to maintain NSR and her pacing wires were removed without difficulty.  She was medically stable for transfer to the step down unit on POD #3.  However, due to census there was no beds available.  She remained medically stable for discharge home from the SICU on POD #4.        Significant Diagnostic Studies: cardiac graphics:   Echocardiogram:   Study Conclusions  - Left ventricle: The cavity size was normal. There was mild  concentric hypertrophy. Systolic function was normal. The  estimated ejection fraction was in the range of 55% to 60%. Wall  motion was normal; there were no regional wall motion  abnormalities. Features are consistent with a pseudonormal left  ventricular filling pattern, with concomitant abnormal relaxation  and increased filling pressure (grade 2 diastolic dysfunction).  Doppler parameters are consistent with elevated ventricular  end-diastolic filling pressure. - Aortic valve: Trileaflet; normal thickness leaflets. There was no  regurgitation. - Aortic root: The aortic root was normal in size. - Mitral valve: Mildly thickened leaflets. There is prolapse of the  posterior mitral valve leaflet with posteriorly directed jet of  mitral regurgitations. Mitral regurgitation appears severe. There  was severe regurgitation. - Left atrium: The atrium was mildly dilated. - Right ventricle: Systolic function was normal. - Right atrium: The atrium was normal in size. - Tricuspid valve: Structurally normal valve. There was mild  regurgitation. - Pulmonic valve: There was trivial regurgitation. - Pulmonary arteries: Systolic pressure was within the normal  range. - Pericardium, extracardiac: There was no pericardial effusion.  Impressions:  - There is  prolapse of the posterior mitral valve leaflet with  posteriorly directed jet of mitral regurgitations. Mitral  regurgitation appears severe.  Treatments: surgery:    Minimally-Invasive Mitral Valve Repair Complex valvuloplasty including triangular resection of flail segment of posterior leaflet (P2) Artificial Gore-tex neochord placement x4 Closure of cleft between P2 and P3 Sorin Memo 3D Ring Annuloplasty (size 10mm, catalog # [id number], serial # [id number])  Disposition: 01-Home or Self Care   Discharge Medications:  The patient has been discharged on:   1.Beta Blocker:  Yes [ x  ]                              No   [   ]                              If No, reason:  2.Ace Inhibitor/ARB: Yes [   ]                                     No  [  x  ]                                     If No, reason: No CAD, labile BP  3.Statin:   Yes [   ]  No  [  x ]                  If No, reason: no CAD  4.Shela Commons:  Yes  [ x  ]                  No   [   ]                  If No, reason:         Discharge Instructions    Amb Referral to Cardiac Rehabilitation    Complete by:  As directed   Diagnosis:  Valve Repair Comment - minimally invasive  Valve:  Mitral            Medication List    STOP taking these medications        amiodarone 200 MG tablet  Commonly known as:  PACERONE      TAKE these medications        aspirin 81 MG EC tablet  Take 1 tablet (81 mg total) by mouth daily.     [employer] EYE ALLERGY RELIEF 0.025-0.3 % ophthalmic solution  Generic drug:  naphazoline-pheniramine  Place 1 drop into both eyes 4 (four) times daily as needed for irritation or allergies.     hydrochlorothiazide 25 MG tablet  Commonly known as:  HYDRODIURIL  Take 1 tablet (25 mg total) by mouth daily.     metoprolol tartrate 25 MG tablet  Commonly known as:  LOPRESSOR  Take 0.5 tablets (12.5 mg total) by mouth 2 (two) times daily.      oxyCODONE 5 MG immediate release tablet  Commonly known as:  Oxy IR/ROXICODONE  Take 1-2 tablets (5-10 mg total) by mouth every 3 (three) hours as needed for severe pain.     potassium chloride SA 20 MEQ tablet  Commonly known as:  K-DUR,KLOR-CON  Take 1 tablet (20 mEq total) by mouth once.     warfarin 2.5 MG tablet  Commonly known as:  COUMADIN  Take 1 tablet (2.5 mg total) by mouth daily at 6 PM.       Follow-up Information    Follow up with Rexene Alberts, MD On 07/16/2015.   Specialty:  Cardiothoracic Surgery   Why:  Appointment is at 10:30   Contact information:   [address] [place] [id number] [phone]       Follow up with [facility] On 07/16/2015.   Why:  Please get CXR at 10:00   Contact information:   [place]       Follow up with [facility] [employer] [facility].   Specialty:  Cardiology   Why:  PT/INR needs checked 48 hours after discharge, if do not get appointment please contact office to setup    Contact information:   [address], [address] [phone]      Follow up with Nahser, Wonda Cheng, MD.   Specialty:  Cardiology   Why:  Cardiology will contact you with appointment   Contact information:   [address] [place] [address] [phone]       Signed: Ellwood Handler 07/02/2015, 10:46 AM

## 2015-07-02 NOTE — Discharge Instructions (Signed)
Mitral Valve Repair, Care After Refer to this sheet in the next few weeks. These instructions provide you with information on caring for yourself after your procedure. Your health care provider may also give you specific instructions. Your treatment has been planned according to current medical practices, but problems sometimes occur. Call your health care provider if you have any problems or questions after your procedure.  HOME CARE INSTRUCTIONS   Take medicines only as directed by your health care provider.  Take your temperature every morning for the first 7 days after surgery. Write these down.  Weigh yourself every morning for at least 7 days after surgery. Write your weight down.  Wear elastic stockings during the day for at least 2 weeks after surgery or as directed by your health care provider. Use them longer if your ankles are swollen. The stockings help blood flow and help reduce swelling in the legs.  Take frequent naps or rest often throughout the day.  Avoid lifting more than 10 lb (4.5 kg) or pushing or pulling things with your arms for 6-8 weeks or as directed by your health care provider.  Avoid driving or airplane travel for 4-6 weeks after surgery or as directed. If you are riding in a car for an extended period, stop every 1-2 hours to stretch your legs.  Avoid crossing your legs.  Avoid climbing stairs and using the handrail to pull yourself up for the first 2-3 weeks after surgery.  Do not take baths for 2-4 weeks after surgery. Take showers once your health care provider approves. Pat incisions dry. Do not rub incisions with a washcloth or towel.  Return to work as directed by your health care provider.  Drink enough fluids to keep your urine clear or pale yellow.  Do not strain to have a bowel movement. Eat high-fiber foods if you become constipated. You may also take a medicine to help you have a bowel movement (laxative) as directed by your health care  provider.  Resume sexual activity as directed by your health care provider. SEEK MEDICAL CARE IF:   You develop a skin rash.   Your weight is increasing each day over 2-3 days.  Your weight increases by 2 or more pounds (1 kg) in a single day.  You have a fever. SEEK IMMEDIATE MEDICAL CARE IF:   You develop chest pain that is not coming from your incision.  You develop shortness of breath or difficulty breathing.  You have drainage, redness, swelling, or pain at your incision site.  You have pus coming from your incision.  You develop light-headedness. MAKE SURE YOU:  Understand these directions.  Will watch your condition.  Will get help right away if you are not doing well or get worse.   This information is not intended to replace advice given to you by your health care provider. Make sure you discuss any questions you have with your health care provider.   Document Released: 09/13/2004 Document Revised: 03/17/2014 Document Reviewed: 07/27/2012 Elsevier Interactive Patient Education 2016 [employer]  Warfarin: What You Need to Know Warfarin is an anticoagulant. Anticoagulants help prevent the formation of blood clots. They also help stop the growth of blood clots. Warfarin is sometimes referred to as a "blood thinner."  Normally, when body tissues are cut or damaged, the blood clots in order to prevent blood loss. Sometimes clots form inside your blood vessels and obstruct the flow of blood through your circulatory system (thrombosis). These clots may travel through  your bloodstream and become lodged in smaller blood vessels in your brain, which can cause a stroke, or in your lungs (pulmonary embolism). WHO SHOULD USE WARFARIN? Warfarin is prescribed for people at risk of developing harmful blood clots:  People with surgically implanted mechanical heart valves, irregular heart rhythms called atrial fibrillation, and certain clotting disorders.  People who have  developed harmful blood clotting in the past, including those who have had a stroke or a pulmonary embolism, or thrombosis in their legs (deep vein thrombosis [DVT]).  People with an existing blood clot, such as a pulmonary embolism. WARFARIN DOSING Warfarin tablets come in different strengths. Each tablet strength is a different color, with the amount of warfarin (in milligrams) clearly printed on the tablet. If the color of your tablet is different than usual when you receive a new prescription, report it immediately to your pharmacist or health care provider. WARFARIN MONITORING The goal of warfarin therapy is to lessen the clotting tendency of blood but not prevent clotting completely. Your health care provider will monitor the anticoagulation effect of warfarin closely and adjust your dose as needed. For your safety, blood tests called prothrombin time (PT) or international normalized ratio (INR) are used to measure the effects of warfarin. Both of these tests can be done with a finger stick or a blood draw. The longer it takes the blood to clot, the higher the PT or INR. Your health care provider will inform you of your "target" PT or INR range. If, at any time, your PT or INR is above the target range, there is a risk of bleeding. If your PT or INR is below the target range, there is a risk of clotting. Whether you are started on warfarin while you are in the hospital or in your health care provider's office, you will need to have your PT or INR checked within one week of starting the medicine. Initially, some people are asked to have their PT or INR checked as much as twice a week. Once you are on a stable maintenance dose, the PT or INR is checked less often, usually once every 2 to 4 weeks. The warfarin dose may be adjusted if the PT or INR is not within the target range. It is important to keep all laboratory and health care provider follow-up appointments. Not keeping appointments could result  in a chronic or permanent injury, pain, or disability because warfarin is a medicine that requires close monitoring. WHAT ARE THE SIDE EFFECTS OF WARFARIN?  Too much warfarin can cause bleeding (hemorrhage) from any part of the body. This may include bleeding from the gums, blood in the urine, bloody or dark stools, a nosebleed that is not easily stopped, coughing up blood, or vomiting blood.  Too little warfarin can increase the risk of blood clots.  Too little or too much warfarin can also increase the risk of a stroke.  Warfarin use may cause a skin rash or irritation, an unusual fever, continual nausea or stomach upset, or severe pain in your joints or back. SPECIAL PRECAUTIONS WHILE TAKING WARFARIN Warfarin should be taken exactly as directed. It is very important to take warfarin as directed since bleeding or blood clots could result in chronic or permanent injury, pain, or disability.  Take your medicine at the same time every day. If you forget to take your dose, you can take it if it is within 6 hours of when it was due.  Do not change the dose of warfarin  on your own to make up for missed or extra doses.  If you miss more than 2 doses in a row, you should contact your health care provider for advice. Avoid situations that cause bleeding. You may have a tendency to bleed more easily than usual while taking warfarin. The following actions can limit bleeding:  Using a softer toothbrush.  Flossing with waxed floss rather than unwaxed floss.  Shaving with an Copy rather than a blade.  Limiting the use of sharp objects.  Avoiding potentially harmful activities, such as contact sports. Warfarin and Pregnancy or Breastfeeding  Warfarin is not advised during the first trimester of pregnancy due to an increased risk of birth defects. In certain situations, a woman may take warfarin after her first trimester of pregnancy. A woman who becomes pregnant or plans to become  pregnant while taking warfarin should notify her health care provider immediately.  Although warfarin does not pass into breast milk, a woman who wishes to breastfeed while taking warfarin should also consult with her health care provider. Alcohol, Smoking, and Illicit Drug Use  Alcohol affects how warfarin works in the body. It is best to avoid alcoholic drinks or consume very small amounts while taking warfarin. In general, alcohol intake should be limited to 1 oz (30 mL) of liquor, 6 oz (180 mL) of wine, or 12 oz (360 mL) of beer each day. Notify your health care provider if you change your alcohol intake.  Smoking affects how warfarin works. It is best to avoid smoking while taking warfarin. Notify your health care provider if you change your smoking habits.  It is best to avoid all illicit drugs while taking warfarin since there are few studies that show how warfarin interacts with these drugs. Other Medicines and Dietary Supplements Many prescription and over-the-counter medicines can interfere with warfarin. Be sure all of your health care providers know you are taking warfarin. Notify your health care provider who prescribed warfarin for you or your pharmacist before starting or stopping any new medicines, including over-the-counter vitamins, dietary supplements, and pain medicines. Your warfarin dose may need to be adjusted. Some common over-the-counter medicines that may increase the risk of bleeding while taking warfarin include:   Acetaminophen.  Aspirin.  Nonsteroidal anti-inflammatory medicines (NSAIDs), such as ibuprofen or naproxen.  Vitamin E. Dietary Considerations  Foods that have moderate or high amounts of vitamin K can interfere with warfarin. Avoid major changes in your diet or notify your health care provider before changing your diet. Eat a consistent amount of foods that have moderate or high amounts of vitamin K. Eating less foods containing vitamin K can increase the  risk of bleeding. Eating more foods containing vitamin K can increase the risk of blood clots. Additional questions about dietary considerations can be discussed with a dietitian. Foods that are very high in vitamin K:  Greens, such as Swiss chard and beet, collard, mustard, or turnip greens (fresh or frozen, cooked).  Kale (fresh or frozen, cooked).  Parsley (raw).  Spinach (cooked). Foods that are high in vitamin K:  Asparagus (frozen, cooked).  Broccoli.  Bok choy (cooked).  Brussels sprouts (fresh or frozen, cooked).  Cabbage (cooked).   Coleslaw. Foods that are moderately high in vitamin K:  Blueberries.  Black-eyed peas.  Endive (raw).  Green leaf lettuce (raw).  Green scallions (raw).  Kale (raw).  Okra (frozen, cooked).  Plantains (fried).  Romaine lettuce (raw).  Sauerkraut (canned).  Spinach (raw). [place]  CARE PROVIDER IF YOU:  Plan to have any surgery or procedure.  Feel sick, especially if you have diarrhea or vomiting.  Experience or anticipate any major changes in your diet.  Start or stop a prescription or over-the-counter medicine.  Become, plan to become, or think you may be pregnant.  Are having heavier than usual menstrual periods.  Have had a fall, accident, or any symptoms of bleeding or unusual bruising.  Develop an unusual fever. CALL 911 IN THE [place] OR GO TO THE EMERGENCY DEPARTMENT IF YOU:   Think you may be having an allergic reaction to warfarin. The signs of an allergic reaction could include itching, rash, hives, swelling, chest tightness, or trouble breathing.  See signs of blood in your urine. The signs could include reddish, pinkish, or tea-colored urine.  See signs of blood in your stools. The signs could include bright red or black stools.  Vomit or cough up blood. In these instances, the blood could have either a bright red or a "coffee-grounds" appearance.  Have bleeding that will not stop  after applying pressure for 30 minutes such as cuts, nosebleeds, or other injuries.  Have severe pain in your joints or back.  Have a new and severe headache.  Have sudden weakness or numbness of your face, arm, or leg, especially on one side of your body.  Have sudden confusion or trouble understanding.  Have sudden trouble seeing in one or both eyes.  Have sudden trouble walking, dizziness, loss of balance, or coordination.  Have trouble speaking or understanding (aphasia).   This information is not intended to replace advice given to you by your health care provider. Make sure you discuss any questions you have with your health care provider.   Document Released: 02/24/2005 Document Revised: 03/17/2014 Document Reviewed: 08/20/2012 Elsevier Interactive Patient Education 2016 [place] and Warfarin Warfarin is a medicine that helps prevent harmful blood clots by causing blood to clot more slowly. It does this by decreasing the activity of vitamin K, which promotes normal blood clotting. For the dose of warfarin you have been prescribed to work well, you need to get about the same amount of vitamin K from your food from day to day. Suddenly getting a lot more vitamin K could cause your blood to clot too quickly. A sudden decrease in vitamin K intake could cause your blood to clot too slowly. These changes in vitamin K intake could lead to dangerous blood clotsor to bleeding. WHAT GENERAL GUIDELINES DO I NEED TO FOLLOW?  Keep your intake of vitamin K consistent from day to day. To do this, you must be aware of which foods contain moderate or high amounts of vitamin K. Listed below are some foods that are very high, high, or moderately high in vitamin K. If you eat these foods, make sure you eat a consistent amount of them from day to day.  Avoid major changes in your diet, or tell your health care provider before changing your diet.  If you take a multivitamin  that contains vitamin K, be sure to take it every day.  If you drink green tea, drink the same amount each day. WHAT FOODS ARE VERY HIGH IN VITAMIN K?   Greens, such as Swiss chard and beet, collard, mustard, or turnip greens (fresh or frozen, cooked).  Kale (fresh or frozen, cooked).   Parsley (raw).  Spinach (cooked).  WHAT FOODS ARE HIGH IN VITAMIN K?  Asparagus (frozen, cooked).  Broccoli.  Bok choy (cooked).   Brussels sprouts (fresh or frozen, cooked).  Cabbage (cooked).  Coleslaw. WHAT FOODS ARE MODERATELY HIGH IN VITAMIN K?  Blueberries.  Black-eyed peas.  Endive (raw).   Green leaf lettuce (raw).   Green scallions (raw).  Kale (raw).  Okra (frozen, cooked).  Plantains (fried).  Romaine lettuce (raw).   Sauerkraut (canned).   Spinach (raw).   This information is not intended to replace advice given to you by your health care provider. Make sure you discuss any questions you have with your health care provider.   Document Released: 12/22/2008 Document Revised: 03/17/2014 Document Reviewed: 12/29/2012 Elsevier Interactive Patient Education [employer].

## 2015-07-02 NOTE — Progress Notes (Signed)
Patient discharged home with husband.  Escorted out to car without difficulty.  All discharge instructions given to patient and questions answered.

## 2015-07-02 NOTE — Progress Notes (Signed)
Pt returned from Xray with transporter, unremarkable trip. Pt stayed connected to telemetry and O2sat monitor the whole time. Vitals stable, pt comfortably resting in recliner in room now.  Henreitta Leber, RN 6:49 AM 07/02/2015

## 2015-07-04 ENCOUNTER — Ambulatory Visit (INDEPENDENT_AMBULATORY_CARE_PROVIDER_SITE_OTHER): Payer: BLUE CROSS/BLUE SHIELD | Admitting: *Deleted

## 2015-07-04 DIAGNOSIS — I341 Nonrheumatic mitral (valve) prolapse: Secondary | ICD-10-CM | POA: Diagnosis not present

## 2015-07-04 DIAGNOSIS — Z5181 Encounter for therapeutic drug level monitoring: Secondary | ICD-10-CM | POA: Diagnosis not present

## 2015-07-04 DIAGNOSIS — I340 Nonrheumatic mitral (valve) insufficiency: Secondary | ICD-10-CM | POA: Diagnosis not present

## 2015-07-04 DIAGNOSIS — Z9889 Other specified postprocedural states: Secondary | ICD-10-CM

## 2015-07-04 LAB — TYPE AND SCREEN
ABO/RH(D): O POS
Antibody Screen: NEGATIVE
UNIT DIVISION: 0
Unit division: 0

## 2015-07-04 LAB — POCT INR: INR: 1.2

## 2015-07-04 NOTE — Patient Instructions (Signed)

## 2015-07-09 ENCOUNTER — Ambulatory Visit (INDEPENDENT_AMBULATORY_CARE_PROVIDER_SITE_OTHER): Payer: BLUE CROSS/BLUE SHIELD | Admitting: *Deleted

## 2015-07-09 DIAGNOSIS — Z5181 Encounter for therapeutic drug level monitoring: Secondary | ICD-10-CM | POA: Diagnosis not present

## 2015-07-09 DIAGNOSIS — I340 Nonrheumatic mitral (valve) insufficiency: Secondary | ICD-10-CM

## 2015-07-09 DIAGNOSIS — Z9889 Other specified postprocedural states: Secondary | ICD-10-CM

## 2015-07-09 DIAGNOSIS — I341 Nonrheumatic mitral (valve) prolapse: Secondary | ICD-10-CM | POA: Diagnosis not present

## 2015-07-09 LAB — POCT INR: INR: 1.5

## 2015-07-13 ENCOUNTER — Other Ambulatory Visit: Payer: Self-pay | Admitting: Thoracic Surgery (Cardiothoracic Vascular Surgery)

## 2015-07-13 DIAGNOSIS — Z9889 Other specified postprocedural states: Secondary | ICD-10-CM

## 2015-07-16 ENCOUNTER — Encounter: Payer: Self-pay | Admitting: Thoracic Surgery (Cardiothoracic Vascular Surgery)

## 2015-07-16 ENCOUNTER — Ambulatory Visit (INDEPENDENT_AMBULATORY_CARE_PROVIDER_SITE_OTHER): Payer: Self-pay | Admitting: Thoracic Surgery (Cardiothoracic Vascular Surgery)

## 2015-07-16 ENCOUNTER — Ambulatory Visit
Admission: RE | Admit: 2015-07-16 | Discharge: 2015-07-16 | Disposition: A | Discharge status: Home / Self Care | Payer: BLUE CROSS/BLUE SHIELD | Source: Ambulatory Visit | Attending: Thoracic Surgery (Cardiothoracic Vascular Surgery) | Admitting: Thoracic Surgery (Cardiothoracic Vascular Surgery)

## 2015-07-16 ENCOUNTER — Ambulatory Visit (INDEPENDENT_AMBULATORY_CARE_PROVIDER_SITE_OTHER): Payer: BLUE CROSS/BLUE SHIELD | Admitting: Pharmacist

## 2015-07-16 VITALS — BP 138/87 | HR 108 | Resp 16 | Ht 67.5 in | Wt 150.5 lb

## 2015-07-16 DIAGNOSIS — Z9889 Other specified postprocedural states: Secondary | ICD-10-CM

## 2015-07-16 DIAGNOSIS — I340 Nonrheumatic mitral (valve) insufficiency: Secondary | ICD-10-CM

## 2015-07-16 DIAGNOSIS — I341 Nonrheumatic mitral (valve) prolapse: Secondary | ICD-10-CM | POA: Diagnosis not present

## 2015-07-16 DIAGNOSIS — J90 Pleural effusion, not elsewhere classified: Secondary | ICD-10-CM | POA: Diagnosis not present

## 2015-07-16 DIAGNOSIS — Z5181 Encounter for therapeutic drug level monitoring: Secondary | ICD-10-CM | POA: Diagnosis not present

## 2015-07-16 LAB — POCT INR: INR: 2.8

## 2015-07-16 MED ORDER — WARFARIN SODIUM 5 MG PO TABS: ORAL_TABLET | ORAL | Status: DC

## 2015-07-16 NOTE — Patient Instructions (Signed)
Continue all previous medications without any changes at this time  You may continue to gradually increase your physical activity as tolerated.  Refrain from any heavy lifting or strenuous use of your arms and shoulders until at least 8 weeks from the time of your surgery, and avoid activities that cause increased pain in your chest on the side of your surgical incision.  Otherwise you may continue to increase activities without any particular limitations.  Increase the intensity and duration of physical activity gradually.  You may return to driving an automobile as long as you are no longer requiring oral narcotic pain relievers during the daytime.  It would be wise to start driving only short distances during the daylight and gradually increase from there as you feel comfortable.  You are encouraged to enroll and participate in the outpatient cardiac rehab program beginning as soon as practical.

## 2015-07-16 NOTE — Progress Notes (Signed)
[address]       [place] [address]             [phone]     CARDIOTHORACIC SURGERY OFFICE NOTE  Referring Provider is Nahser, Wonda Cheng, MD PCP is Lottie Dawson, MD   HPI:  Patient returns to the office today for routine follow-up status post minimally invasive mitral valve repair on 06/28/2015 for mitral valve prolapse with severe primary mitral regurgitation.  Her early postoperative recovery was uneventful and she was discharged home on the fourth postoperative day. Since hospital discharge the patient has continued to do fairly well. Her prothrombin time has been checked and Coumadin dose adjusted through the Coumadin clinic.  She was still subtherapeutic with INR 1.5 when it was last checked on 07/09/2015. She returns to our office for routine follow-up. She states that she is doing well, although she still feels tired. She has not had significant soreness in her chest and she stopped taking all pain relievers other than Tylenol. She has not had any dizzy spells. She has no shortness of breath. She is walking every day but she gets tired easily. She is sleeping fairly well. Appetite is okay. Overall she has no complaints.   Current Outpatient Prescriptions  Medication Sig Dispense Refill  . hydrochlorothiazide (HYDRODIURIL) 25 MG tablet Take 1 tablet (25 mg total) by mouth daily. 30 tablet 11  . metoprolol tartrate (LOPRESSOR) 25 MG tablet Take 0.5 tablets (12.5 mg total) by mouth 2 (two) times daily. 60 tablet 3  . oxyCODONE (OXY IR/ROXICODONE) 5 MG immediate release tablet Take 1-2 tablets (5-10 mg total) by mouth every 3 (three) hours as needed for severe pain. 30 tablet 0  . potassium chloride SA (K-DUR,KLOR-CON) 20 MEQ tablet Take 1 tablet (20 mEq total) by mouth once. (Patient taking differently: Take 20 mEq by mouth daily. ) 30 tablet 11  . warfarin (COUMADIN) 2.5 MG tablet Take 1 tablet (2.5 mg total) by mouth daily at 6 PM. 30 tablet 3  .  aspirin EC 81 MG EC tablet Take 1 tablet (81 mg total) by mouth daily. (Patient not taking: Reported on 07/16/2015)    . naphazoline-pheniramine ([employer] EYE ALLERGY RELIEF) 0.025-0.3 % ophthalmic solution Place 1 drop into both eyes 4 (four) times daily as needed for irritation or allergies. Reported on 07/16/2015     No current facility-administered medications for this visit.      Physical Exam:   BP 138/87 mmHg  Pulse 108  Resp 16  Ht 5' 7.5" (1.715 m)  Wt 150 lb 8 oz (68.266 kg)  BMI 23.21 kg/m2  SpO2 96%  General:  Well-appearing  Chest:   Clear to auscultation  CV:   Regular rate and rhythm without murmur  Incisions:  Healing nicely  Abdomen:  Soft nontender  Extremities:  Warm and well-perfused  Diagnostic Tests:  CHEST 2 VIEW  COMPARISON: 07/02/2015 chest radiograph.  FINDINGS: Mitral valve prosthesis is stable in position. Left breast and left axillary surgical clips are again noted. Stable cardiomediastinal silhouette with mild cardiomegaly. No pneumothorax. Small right pleural effusion appears stable. Small left pleural effusion is slightly increased. No overt pulmonary edema. Mild-to-moderate bibasilar atelectasis is unchanged.  IMPRESSION: 1. Stable mild cardiomegaly without overt pulmonary edema. 2. Small bilateral pleural effusions, stable on the right and slightly increased on the left. 3. Stable bibasilar atelectasis.   Electronically Signed  By: Ilona Sorrel M.D.  On: 07/16/2015 10:05   Impression:  Patient is doing well approximately 2-1/2 weeks status post minimally invasive mitral valve repair.    Plan:  I have encouraged patient to continue to gradually increase her physical activity as tolerated with her primary limitation remaining that she refrain from heavy lifting or strenuous use of her arms or shoulders. I think she may resume driving an automobile and I have encouraged her to enroll and participate in outpatient cardiac  rehabilitation program. We have not recommended any changes to her current medications today. She is slightly tachycardic, but I'm reluctant to increase her metoprolol dose at this time. She might benefit from addition of an ACE-I, but will hold off for now.  She states that she does not have a follow-up appointment scheduled with Dr. Acie Fredrickson.  We will try to make certain that she gets one set up. She will need routine follow-up echocardiogram performed. The patient will return to our office in 6 weeks or sooner should specific problems or difficulties arise.   Valentina Gu. Roxy Manns, MD 07/16/2015 10:35 AM

## 2015-07-24 ENCOUNTER — Encounter: Payer: Self-pay | Admitting: *Deleted

## 2015-07-24 ENCOUNTER — Ambulatory Visit (INDEPENDENT_AMBULATORY_CARE_PROVIDER_SITE_OTHER): Payer: BLUE CROSS/BLUE SHIELD | Admitting: *Deleted

## 2015-07-24 DIAGNOSIS — Z9889 Other specified postprocedural states: Secondary | ICD-10-CM

## 2015-07-24 DIAGNOSIS — Z5181 Encounter for therapeutic drug level monitoring: Secondary | ICD-10-CM

## 2015-07-24 DIAGNOSIS — I341 Nonrheumatic mitral (valve) prolapse: Secondary | ICD-10-CM

## 2015-07-24 DIAGNOSIS — I340 Nonrheumatic mitral (valve) insufficiency: Secondary | ICD-10-CM

## 2015-07-24 LAB — POCT INR: INR: 4.5

## 2015-07-24 NOTE — Progress Notes (Signed)
Patient ID: Tracy Chen, female   DOB: 1955/08/03, 60 y.o.   MRN: [id number] A referral was faxed to Agency to contact Ms. Dory to begin Phase II per the request of Dr. Darylene Price.

## 2015-08-03 ENCOUNTER — Ambulatory Visit (INDEPENDENT_AMBULATORY_CARE_PROVIDER_SITE_OTHER): Payer: BLUE CROSS/BLUE SHIELD | Admitting: *Deleted

## 2015-08-03 DIAGNOSIS — Z9889 Other specified postprocedural states: Secondary | ICD-10-CM

## 2015-08-03 DIAGNOSIS — Z5181 Encounter for therapeutic drug level monitoring: Secondary | ICD-10-CM

## 2015-08-03 DIAGNOSIS — I341 Nonrheumatic mitral (valve) prolapse: Secondary | ICD-10-CM

## 2015-08-03 DIAGNOSIS — I340 Nonrheumatic mitral (valve) insufficiency: Secondary | ICD-10-CM

## 2015-08-03 LAB — POCT INR: INR: 1.7

## 2015-08-13 ENCOUNTER — Ambulatory Visit (INDEPENDENT_AMBULATORY_CARE_PROVIDER_SITE_OTHER): Payer: BLUE CROSS/BLUE SHIELD | Admitting: *Deleted

## 2015-08-13 ENCOUNTER — Ambulatory Visit (INDEPENDENT_AMBULATORY_CARE_PROVIDER_SITE_OTHER): Payer: BLUE CROSS/BLUE SHIELD | Admitting: Cardiovascular Disease

## 2015-08-13 ENCOUNTER — Encounter: Payer: Self-pay | Admitting: Cardiovascular Disease

## 2015-08-13 VITALS — BP 128/80 | HR 108 | Ht 67.5 in | Wt 151.0 lb

## 2015-08-13 DIAGNOSIS — I341 Nonrheumatic mitral (valve) prolapse: Secondary | ICD-10-CM | POA: Diagnosis not present

## 2015-08-13 DIAGNOSIS — Z5181 Encounter for therapeutic drug level monitoring: Secondary | ICD-10-CM

## 2015-08-13 DIAGNOSIS — I340 Nonrheumatic mitral (valve) insufficiency: Secondary | ICD-10-CM

## 2015-08-13 DIAGNOSIS — Z9889 Other specified postprocedural states: Secondary | ICD-10-CM

## 2015-08-13 LAB — POCT INR: INR: 2.9

## 2015-08-13 MED ORDER — CLINDAMYCIN HCL 150 MG PO CAPS: 150.0000 mg | ORAL_CAPSULE | ORAL | Status: DC

## 2015-08-13 MED ORDER — METOPROLOL TARTRATE 25 MG PO TABS: 25.0000 mg | ORAL_TABLET | Freq: Two times a day (BID) | ORAL | Status: DC

## 2015-08-13 NOTE — Patient Instructions (Signed)
Medication Instructions:  INCREASE Metoprolol to 25 mg twice daily TAKE Clindamycin 150 mg take 4 tabs 1 hour before dental work   Labwork: None ordered  Testing/Procedures: Your physician has requested that you have an echocardiogram. Echocardiography is a painless test that uses sound waves to create images of your heart. It provides your doctor with information about the size and shape of your heart and how well your heart's chambers and valves are working. This procedure takes approximately one hour. There are no restrictions for this procedure.   Follow-Up: Your physician recommends that you schedule a follow-up appointment in: 6 weeks with Dr. Acie Fredrickson.    If you need a refill on your cardiac medications before your next appointment, please call your pharmacy.   Thank you for choosing [facility]! Christen Bame, RN [phone]

## 2015-08-13 NOTE — Progress Notes (Signed)
Cardiology Office Note   Date:  08/13/2015   ID:  Tracy Chen, DOB 12/08/1955, MRN [id number]  PCP:  Lottie Dawson, MD  Cardiologist:   Mertie Moores, MD   No chief complaint on file.  Problem List 1. 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 MV repair , June 28, 2015 3. Breast cancer . Left breast, s/p chemo and XRT    History of Present Illness: Tracy Chen is a 60 y.o. female who presents for evaluation of a heart murmur. She was recently found have a heart murmur on exam. She had an echo card gram which revealed mitral valve prolapse involving the posterior leaflet. She has associated severe mitral regurgitation.    no hx of murmur in the past.  No CP or dyspnea.   Exercises regularly without problems.  No limitations  August 13, 2015:  Tracy Chen is seen today for follow up of her MV repair ( June 28, 2015)  Healing up .  Feeling some better Trying to exercise.    HR is fast  Has a "white cloud" over her left eye for a few minutes over [facility]  No other neurologic deficits.    Past Medical History  Diagnosis Date  . Allergic rhinitis   . HX: breast cancer     hx of 2002 left er/pr 3.2cm  . Osteoporosis     L5  . History of chickenpox     as a child  . Gallstone   . Breast cancer ([place])   . 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 Mitral valve prolapse 02/21/2015  . Severe mitral regurgitation 02/21/2015  . S/P minimally invasive mitral valve repair 06/28/2015    Complex valvuloplasty including triangular resection of flail segment of posterior leaflet, artificial Gore-tex neochord placement x4 and 32 mm Sorin Memo 3D ring annuloplasty via right mini-thoracotomy incision    Past Surgical History  Procedure Laterality Date  . Abd [place]      gallstones CT gallstones 1/06  . Parathyroidectomy  11/06    left inferior  . Partial left mastectomy     . Colonoscopy    . Tee without cardioversion N/A 04/25/2015    Procedure: TRANSESOPHAGEAL ECHOCARDIOGRAM  (TEE);  Surgeon: Thayer Headings, MD;  Location[facility] ENDOSCOPY;  Service: Cardiovascular;  Laterality: N/A;  . Cardiac catheterization N/A 05/29/2015    Procedure: Right/Left Heart Cath and Coronary Angiography;  Surgeon: Peter M Martinique, MD;  Location: [facility] CV LAB;  Service: Cardiovascular;  Laterality: N/A;  . Mitral valve repair Right 06/28/2015    Procedure: MINIMALLY INVASIVE MITRAL VALVE REPAIR (MVR);  Surgeon: Rexene Alberts, MD;  Location: [place];  Service: Open Heart Surgery;  Laterality: Right;  32 Sorin Memo 3D Ring  . Tee without cardioversion N/A 06/28/2015    Procedure: TRANSESOPHAGEAL ECHOCARDIOGRAM (TEE);  Surgeon: Rexene Alberts, MD;  Location: [facility];  Service: Open Heart Surgery;  Laterality: N/A;     Current Outpatient Prescriptions  Medication Sig Dispense Refill  . hydrochlorothiazide (HYDRODIURIL) 25 MG tablet Take 1 tablet (25 mg total) by mouth daily. 30 tablet 11  . metoprolol tartrate (LOPRESSOR) 25 MG tablet Take 1 tablet (25 mg total) by mouth 2 (two) times daily. 60 tablet 11  . naphazoline-pheniramine ([employer] EYE ALLERGY RELIEF) 0.025-0.3 % ophthalmic solution Place 1 drop into both eyes 4 (four) times daily as needed for irritation or allergies. Reported on 07/16/2015    . oxyCODONE (OXY IR/ROXICODONE) 5 MG immediate release  tablet Take 1-2 tablets (5-10 mg total) by mouth every 3 (three) hours as needed for severe pain. 30 tablet 0  . potassium chloride SA (K-DUR,KLOR-CON) 20 MEQ tablet Take 1 tablet (20 mEq total) by mouth once. (Patient taking differently: Take 20 mEq by mouth daily. ) 30 tablet 11  . warfarin (COUMADIN) 5 MG tablet Take as directed by [facility]. 30 tablet 1  . clindamycin (CLEOCIN) 150 MG capsule Take 1 capsule (150 mg total) by mouth as directed. Take 4 tabs 1 hour prior to dental work 4 capsule 6   No current facility-administered medications for this visit.    Allergies:   Penicillins    Social History:  The patient  reports that  she has never smoked. She has never used smokeless tobacco. She reports that she does not drink alcohol or use illicit drugs.   Family History:  The patient's family history includes Colon cancer in her maternal aunt and mother; Heart murmur in her brother; Osteoporosis in her maternal aunt; Valvular heart disease in her father.    ROS:  Please see the history of present illness.    Review of Systems: Constitutional:  denies fever, chills, diaphoresis, appetite change and fatigue.  HEENT: denies photophobia, eye pain, redness, hearing loss, ear pain, congestion, sore throat, rhinorrhea, sneezing, neck pain, neck stiffness and tinnitus.  Respiratory: denies SOB, DOE, cough, chest tightness, and wheezing.  Cardiovascular: denies chest pain, palpitations and leg swelling.  Gastrointestinal: denies nausea, vomiting, abdominal pain, diarrhea, constipation, blood in stool.  Genitourinary: denies dysuria, urgency, frequency, hematuria, flank pain and difficulty urinating.  Musculoskeletal: denies  myalgias, back pain, joint swelling, arthralgias and gait problem.   Skin: denies pallor, rash and wound.  Neurological: denies dizziness, seizures, syncope, weakness, light-headedness, numbness and headaches.   Hematological: denies adenopathy, easy bruising, personal or family bleeding history.  Psychiatric/ Behavioral: denies suicidal ideation, mood changes, confusion, nervousness, sleep disturbance and agitation.       All other systems are reviewed and negative.    PHYSICAL EXAM: VS:  BP 128/80 mmHg  Pulse 108  Ht 5' 7.5" (1.715 m)  Wt 151 lb (68.493 kg)  BMI 23.29 kg/m2 , BMI Body mass index is 23.29 kg/(m^2). GEN: Well nourished, well developed, in no acute distress HEENT: normal Neck: no JVD, carotid bruits, or masses Cardiac: RRR; tachycardic,  no rubs, or gallops,no edema  Respiratory:  clear to auscultation bilaterally, normal work of breathing GI: soft, nontender, nondistended, +  BS MS: no deformity or atrophy Skin: warm and dry, no rash Neuro:  Strength and sensation are intact Psych: normal   EKG:  EKG is not ordered today.    Recent Labs: 01/23/2015: TSH 1.15 06/26/2015: ALT 32 06/29/2015: Magnesium 2.2 07/01/2015: BUN 9; Creatinine, Ser 0.67; Hemoglobin 10.0*; Platelets 217; Potassium 3.6; Sodium 139    Lipid Panel    Component Value Date/Time   CHOL 148 01/23/2015 0838   TRIG 74.0 01/23/2015 0838   TRIG 62 12/26/2005 1000   HDL 47.40 01/23/2015 0838   CHOLHDL 3 01/23/2015 0838   CHOLHDL 3.4 CALC 12/26/2005 1000   VLDL 14.8 01/23/2015 0838   LDLCALC 86 01/23/2015 0838      Wt Readings from Last 3 Encounters:  08/13/15 151 lb (68.493 kg)  07/16/15 150 lb 8 oz (68.266 kg)  07/02/15 163 lb 5.8 oz (74.1 kg)      Other studies Reviewed: Additional studies/ records that were reviewed today include: . Review of the above records demonstrates:  ASSESSMENT AND PLAN:  1.  MVP, / MR Will give script for clindamycin 150 mg tabs, to take 4 tabs one hour prior to dental work  Seems to be doing well  We will get an echocardiogram for baseline following her surgery.  2. Tachycardia - will increase metoprolol to 25 mg BID   I will see her in 6 weeks for follow up visit     Current medicines are reviewed at length with the patient today.  The patient does not have concerns regarding medicines.  The following changes have been made:  no change  Labs/ tests ordered today include:   Orders Placed This Encounter  Procedures  . ECHOCARDIOGRAM COMPLETE    Disposition:   FU with me in 3 months      Mertie Moores, MD  08/13/2015 5:10 PM    [facility] [place], [place], [place]  [address] Phone: [phone]; Fax: [phone]   [facility]  [address] [place], [place]  [address] [phone]   Fax [phone]

## 2015-08-27 ENCOUNTER — Encounter: Payer: Self-pay | Admitting: Thoracic Surgery (Cardiothoracic Vascular Surgery)

## 2015-08-27 ENCOUNTER — Ambulatory Visit (INDEPENDENT_AMBULATORY_CARE_PROVIDER_SITE_OTHER): Payer: Self-pay | Admitting: Thoracic Surgery (Cardiothoracic Vascular Surgery)

## 2015-08-27 VITALS — BP 134/80 | HR 93 | Resp 16 | Ht 67.5 in | Wt 151.0 lb

## 2015-08-27 DIAGNOSIS — I340 Nonrheumatic mitral (valve) insufficiency: Secondary | ICD-10-CM

## 2015-08-27 DIAGNOSIS — Z9889 Other specified postprocedural states: Secondary | ICD-10-CM

## 2015-08-27 NOTE — Progress Notes (Signed)
[address]       [place] [address]             [phone]     CARDIOTHORACIC SURGERY OFFICE NOTE  Referring Provider is Nahser, Wonda Cheng, MD PCP is Lottie Dawson, MD   HPI:  Patient returns to the office today for routine follow-up status post minimally invasive mitral valve repair on 06/28/2015 for mitral valve prolapse with severe primary mitral regurgitation. Her early postoperative recovery was uneventful and she was discharged home on the fourth postoperative day. She was last seen here in our office on 07/16/2015. Since then she has continued to make good progress. She has been seen in follow-up by Dr. Acie Fredrickson who increased her dose of metoprolol. She has been scheduled for routine follow-up echocardiogram later this month.  She returns to our office for routine follow-up today. She reports that overall she feels pretty well. She has minimal residual soreness in her chest and she has not been taking any sort of pain relievers for quite some time now. She is walking every day. She has not started the outpatient cardiac rehabilitation program. She is sleeping well at night. She reports no shortness of breath with activity.   Current Outpatient Prescriptions  Medication Sig Dispense Refill  . clindamycin (CLEOCIN) 150 MG capsule Take 1 capsule (150 mg total) by mouth as directed. Take 4 tabs 1 hour prior to dental work 4 capsule 6  . hydrochlorothiazide (HYDRODIURIL) 25 MG tablet Take 1 tablet (25 mg total) by mouth daily. 30 tablet 11  . metoprolol tartrate (LOPRESSOR) 25 MG tablet Take 1 tablet (25 mg total) by mouth 2 (two) times daily. 60 tablet 11  . naphazoline-pheniramine ([employer] EYE ALLERGY RELIEF) 0.025-0.3 % ophthalmic solution Place 1 drop into both eyes 4 (four) times daily as needed for irritation or allergies. Reported on 07/16/2015    . potassium chloride SA (K-DUR,KLOR-CON) 20 MEQ tablet Take 1 tablet (20 mEq total) by mouth once. (Patient taking  differently: Take 20 mEq by mouth daily. ) 30 tablet 11  . warfarin (COUMADIN) 5 MG tablet Take as directed by Coumadin Clinic. 30 tablet 1   No current facility-administered medications for this visit.      Physical Exam:   BP 134/80 mmHg  Pulse 93  Resp 16  Ht 5' 7.5" (1.715 m)  Wt 151 lb (68.493 kg)  BMI 23.29 kg/m2  SpO2 98%  General:  Well appearing  Chest:   Clear to auscultation  CV:   Regular rate and rhythm without murmur  Incisions:  Healing nicely  Abdomen:  Soft and nontender  Extremities:  Warm and well-perfused  Diagnostic Tests:  n/a   Impression:  Patient is doing very well approximately 2 months status post minimally invasive mitral valve repair.  Plan:  I have encouraged the patient to continue to gradually increase her physical activity as tolerated without any particular limitations at this time. I think it would be reasonable to stop Coumadin in approximately 1 month if she continues to do well and has no signs of atrial dysrhythmias. I have instructed the patient to take aspirin 81 mg daily when she stops taking Coumadin. We have not recommended any other changes to her current medications. I have encouraged the patient to enroll in participate in the outpatient cardiac rehabilitation program. The patient has been reminded regarding the importance of dental hygiene and the lifelong need for antibiotic prophylaxis for all dental cleanings and other  related invasive procedures.  All of her questions have been addressed.  The patient will return for routine follow-up next April, approximately 1 year following her surgery. She will call and return sooner should specific problems or questions arise.   Valentina Gu. Roxy Manns, MD 08/27/2015 5:27 PM

## 2015-08-27 NOTE — Patient Instructions (Addendum)
You may gradually resume unrestricted physical activity without any particular limitations at this time.  Continue all previous medications without any changes at this time  May stop taking Coumadin and start taking aspirin 81 mg daily in one month if okay with Dr Acie Fredrickson  Endocarditis is a potentially serious infection of heart valves or inside lining of the heart.  It occurs more commonly in patients with diseased heart valves (such as patient's with aortic or mitral valve disease) and in patients who have undergone heart valve repair or replacement.  Certain surgical and dental procedures may put you at risk, such as dental cleaning, other dental procedures, or any surgery involving the respiratory, urinary, gastrointestinal tract, gallbladder or prostate gland.   To minimize your chances for develooping endocarditis, maintain good oral health and seek prompt medical attention for any infections involving the mouth, teeth, gums, skin or urinary tract.    Always notify your doctor or dentist about your underlying heart valve condition before having any invasive procedures. You will need to take antibiotics before certain procedures, including all routine dental cleanings or other dental procedures.  Your cardiologist or dentist should prescribe these antibiotics for you to be taken ahead of time.

## 2015-08-30 ENCOUNTER — Ambulatory Visit (INDEPENDENT_AMBULATORY_CARE_PROVIDER_SITE_OTHER): Payer: BLUE CROSS/BLUE SHIELD | Admitting: *Deleted

## 2015-08-30 ENCOUNTER — Ambulatory Visit (HOSPITAL_COMMUNITY): Payer: BLUE CROSS/BLUE SHIELD | Attending: Cardiology

## 2015-08-30 ENCOUNTER — Other Ambulatory Visit: Payer: Self-pay

## 2015-08-30 DIAGNOSIS — Z8249 Family history of ischemic heart disease and other diseases of the circulatory system: Secondary | ICD-10-CM | POA: Diagnosis not present

## 2015-08-30 DIAGNOSIS — I059 Rheumatic mitral valve disease, unspecified: Secondary | ICD-10-CM

## 2015-08-30 DIAGNOSIS — Z9889 Other specified postprocedural states: Secondary | ICD-10-CM

## 2015-08-30 DIAGNOSIS — I341 Nonrheumatic mitral (valve) prolapse: Secondary | ICD-10-CM

## 2015-08-30 DIAGNOSIS — Z5181 Encounter for therapeutic drug level monitoring: Secondary | ICD-10-CM

## 2015-08-30 DIAGNOSIS — I340 Nonrheumatic mitral (valve) insufficiency: Secondary | ICD-10-CM

## 2015-08-30 DIAGNOSIS — I517 Cardiomegaly: Secondary | ICD-10-CM | POA: Insufficient documentation

## 2015-08-30 LAB — POCT INR: INR: 2.5

## 2015-09-20 ENCOUNTER — Ambulatory Visit (INDEPENDENT_AMBULATORY_CARE_PROVIDER_SITE_OTHER): Payer: BLUE CROSS/BLUE SHIELD | Admitting: *Deleted

## 2015-09-20 ENCOUNTER — Other Ambulatory Visit: Payer: Self-pay | Admitting: Cardiovascular Disease

## 2015-09-20 DIAGNOSIS — Z5181 Encounter for therapeutic drug level monitoring: Secondary | ICD-10-CM

## 2015-09-20 DIAGNOSIS — I059 Rheumatic mitral valve disease, unspecified: Secondary | ICD-10-CM

## 2015-09-20 DIAGNOSIS — I340 Nonrheumatic mitral (valve) insufficiency: Secondary | ICD-10-CM

## 2015-09-20 DIAGNOSIS — Z9889 Other specified postprocedural states: Secondary | ICD-10-CM | POA: Diagnosis not present

## 2015-09-20 DIAGNOSIS — I341 Nonrheumatic mitral (valve) prolapse: Secondary | ICD-10-CM

## 2015-09-20 LAB — POCT INR: INR: 1.9

## 2015-09-27 ENCOUNTER — Encounter: Payer: Self-pay | Admitting: Cardiovascular Disease

## 2015-09-27 ENCOUNTER — Ambulatory Visit: Payer: BLUE CROSS/BLUE SHIELD | Admitting: *Deleted

## 2015-09-27 ENCOUNTER — Ambulatory Visit (INDEPENDENT_AMBULATORY_CARE_PROVIDER_SITE_OTHER): Payer: BLUE CROSS/BLUE SHIELD | Admitting: Cardiovascular Disease

## 2015-09-27 VITALS — BP 122/78 | HR 96 | Ht 67.5 in | Wt 157.8 lb

## 2015-09-27 DIAGNOSIS — Z9889 Other specified postprocedural states: Secondary | ICD-10-CM | POA: Diagnosis not present

## 2015-09-27 DIAGNOSIS — I341 Nonrheumatic mitral (valve) prolapse: Secondary | ICD-10-CM

## 2015-09-27 DIAGNOSIS — I340 Nonrheumatic mitral (valve) insufficiency: Secondary | ICD-10-CM | POA: Diagnosis not present

## 2015-09-27 DIAGNOSIS — Z5181 Encounter for therapeutic drug level monitoring: Secondary | ICD-10-CM

## 2015-09-27 LAB — BASIC METABOLIC PANEL
BUN: 14 mg/dL (ref 7–25)
CHLORIDE: 101 mmol/L (ref 98–110)
CO2: 28 mmol/L (ref 20–31)
Calcium: 9.4 mg/dL (ref 8.6–10.4)
Creat: 0.66 mg/dL (ref 0.50–1.05)
Glucose, Bld: 87 mg/dL (ref 65–99)
POTASSIUM: 4.3 mmol/L (ref 3.5–5.3)
Sodium: 139 mmol/L (ref 135–146)

## 2015-09-27 MED ORDER — ASPIRIN EC 81 MG PO TBEC: 81.0000 mg | DELAYED_RELEASE_TABLET | Freq: Every day | ORAL | Status: AC

## 2015-09-27 NOTE — Patient Instructions (Signed)
Medication Instructions:  STOP Coumadin START Aspirin 81 mg once daily   Labwork: TODAY - basic metabolic panel   Testing/Procedures: None Ordered   Follow-Up: Your physician wants you to follow-up in: 6 months with Dr. Acie Fredrickson.  You will receive a reminder letter in the mail two months in advance. If you don't receive a letter, please call our office to schedule the follow-up appointment.   If you need a refill on your cardiac medications before your next appointment, please call your pharmacy.   Thank you for choosing [facility]! Christen Bame, RN [phone]

## 2015-09-27 NOTE — Progress Notes (Signed)
Cardiology Office Note   Date:  09/27/2015   ID:  Tracy Chen, DOB 06/21/1955, MRN [id number]  PCP:  Lottie Dawson, MD  Cardiologist:   Mertie Moores, MD   Chief Complaint  Patient presents with  . Follow-up    MVP   Problem List 1. 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 MV repair , June 28, 2015 3. Breast cancer . Left breast, s/p chemo and XRT    History of Present Illness: Tracy Chen is a 60 y.o. female who presents for evaluation of a heart murmur. She was recently found have a heart murmur on exam. She had an echo card gram which revealed mitral valve prolapse involving the posterior leaflet. She has associated severe mitral regurgitation.    no hx of murmur in the past.  No CP or dyspnea.   Exercises regularly without problems.  No limitations  August 13, 2015:  Tracy Chen is seen today for follow up of her MV repair ( June 28, 2015)  Healing up .  Feeling some better Trying to exercise.    HR is fast  Has a "white cloud" over her left eye for a few minutes over [facility] day  No other neurologic deficits.    September 27, 2015:    Doing great.  S/p MV repair. Did not Take her metoprolol this point. Her heart rate is a little elevated as result.   Past Medical History  Diagnosis Date  . Allergic rhinitis   . HX: breast cancer     hx of 2002 left er/pr 3.2cm  . Osteoporosis     L5  . History of chickenpox     as a child  . Gallstone   . Breast cancer ([place])   . 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 Mitral valve prolapse 02/21/2015  . Severe mitral regurgitation 02/21/2015  . S/P minimally invasive mitral valve repair 06/28/2015    Complex valvuloplasty including triangular resection of flail segment of posterior leaflet, artificial Gore-tex neochord placement x4 and 32 mm Sorin Memo 3D ring annuloplasty via right mini-thoracotomy incision    Past Surgical History  Procedure Laterality Date  . Abd [place]      gallstones CT gallstones 1/06  .  Parathyroidectomy  11/06    left inferior  . Partial left mastectomy     . Colonoscopy    . Tee without cardioversion N/A 04/25/2015    Procedure: TRANSESOPHAGEAL ECHOCARDIOGRAM (TEE);  Surgeon: Thayer Headings, MD;  Location: [facility] ENDOSCOPY;  Service: Cardiovascular;  Laterality: N/A;  . Cardiac catheterization N/A 05/29/2015    Procedure: Right/Left Heart Cath and Coronary Angiography;  Surgeon: Peter M Martinique, MD;  Location: [facility] CV LAB;  Service: Cardiovascular;  Laterality: N/A;  . Mitral valve repair Right 06/28/2015    Procedure: MINIMALLY INVASIVE MITRAL VALVE REPAIR (MVR);  Surgeon: Rexene Alberts, MD;  Location: [facility];  Service: Open Heart Surgery;  Laterality: Right;  32 Sorin Memo 3D Ring  . Tee without cardioversion N/A 06/28/2015    Procedure: TRANSESOPHAGEAL ECHOCARDIOGRAM (TEE);  Surgeon: Rexene Alberts, MD;  Location: [facility];  Service: Open Heart Surgery;  Laterality: N/A;     Current Outpatient Prescriptions  Medication Sig Dispense Refill  . clindamycin (CLEOCIN) 150 MG capsule Take 1 capsule (150 mg total) by mouth as directed. Take 4 tabs 1 hour prior to dental work 4 capsule 6  . hydrochlorothiazide (HYDRODIURIL) 25 MG tablet Take 1 tablet (25 mg total) by mouth  daily. 30 tablet 11  . metoprolol tartrate (LOPRESSOR) 25 MG tablet Take 1 tablet (25 mg total) by mouth 2 (two) times daily. 60 tablet 11  . potassium chloride SA (K-DUR,KLOR-CON) 20 MEQ tablet Take 20 mEq by mouth daily.    Marland Kitchen warfarin (COUMADIN) 5 MG tablet TAKE AS DIRECTED BY  COUMADIN  CLINIC  **TO  REPLACE  2.5  MG  STRENGTH** 30 tablet 0   No current facility-administered medications for this visit.    Allergies:   Penicillins    Social History:  The patient  reports that she has never smoked. She has never used smokeless tobacco. She reports that she does not drink alcohol or use illicit drugs.   Family History:  The patient's family history includes Colon cancer in her maternal aunt and mother;  Heart murmur in her brother; Osteoporosis in her maternal aunt; Valvular heart disease in her father.    ROS:  Please see the history of present illness.    Review of Systems: Constitutional:  denies fever, chills, diaphoresis, appetite change and fatigue.  HEENT: denies photophobia, eye pain, redness, hearing loss, ear pain, congestion, sore throat, rhinorrhea, sneezing, neck pain, neck stiffness and tinnitus.  Respiratory: denies SOB, DOE, cough, chest tightness, and wheezing.  Cardiovascular: denies chest pain, palpitations and leg swelling.  Gastrointestinal: denies nausea, vomiting, abdominal pain, diarrhea, constipation, blood in stool.  Genitourinary: denies dysuria, urgency, frequency, hematuria, flank pain and difficulty urinating.  Musculoskeletal: denies  myalgias, back pain, joint swelling, arthralgias and gait problem.   Skin: denies pallor, rash and wound.  Neurological: denies dizziness, seizures, syncope, weakness, light-headedness, numbness and headaches.   Hematological: denies adenopathy, easy bruising, personal or family bleeding history.  Psychiatric/ Behavioral: denies suicidal ideation, mood changes, confusion, nervousness, sleep disturbance and agitation.       All other systems are reviewed and negative.    PHYSICAL EXAM: VS:  BP 122/78 mmHg  Pulse 96  Ht 5' 7.5" (1.715 m)  Wt 157 lb 12.8 oz (71.578 kg)  BMI 24.34 kg/m2  SpO2 99% , BMI Body mass index is 24.34 kg/(m^2). GEN: Well nourished, well developed, in no acute distress HEENT: normal Neck: no JVD, carotid bruits, or masses Cardiac: RRR; tachycardic,  no rubs, or gallops,no edema  Respiratory:  clear to auscultation bilaterally, normal work of breathing GI: soft, nontender, nondistended, + BS MS: no deformity or atrophy Skin: warm and dry, no rash Neuro:  Strength and sensation are intact Psych: normal   EKG:  EKG is not ordered today.    Recent Labs: 01/23/2015: TSH 1.15 06/26/2015: ALT  32 06/29/2015: Magnesium 2.2 07/01/2015: BUN 9; Creatinine, Ser 0.67; Hemoglobin 10.0*; Platelets 217; Potassium 3.6; Sodium 139    Lipid Panel    Component Value Date/Time   CHOL 148 01/23/2015 0838   TRIG 74.0 01/23/2015 0838   TRIG 62 12/26/2005 1000   HDL 47.40 01/23/2015 0838   CHOLHDL 3 01/23/2015 0838   CHOLHDL 3.4 CALC 12/26/2005 1000   VLDL 14.8 01/23/2015 0838   LDLCALC 86 01/23/2015 0838      Wt Readings from Last 3 Encounters:  09/27/15 157 lb 12.8 oz (71.578 kg)  08/27/15 151 lb (68.493 kg)  08/13/15 151 lb (68.493 kg)      Other studies Reviewed: Additional studies/ records that were reviewed today include: . Review of the above records demonstrates:    ASSESSMENT AND PLAN:  1.  MVP, / MR Will give script for clindamycin 150 mg tabs, to take  4 tabs one hour prior to dental work  Seems to be doing well    2. Tachycardia - on  metoprolol t25 mg BID , her heart rate is still fast. She did not take her metoprolol this morning  I have asked her to send [place] some vital signs via the " My chart"   portal in  a week or so.  I will see her in 6 wmonths  for follow up visit   Current medicines are reviewed at length with the patient today.  The patient does not have concerns regarding medicines.  The following changes have been made:  no change  Labs/ tests ordered today include:   No orders of the defined types were placed in this encounter.     Mertie Moores, MD  09/27/2015 11:38 AM    [facility] [place], [place], [place]  [address] Phone: [phone]; Fax: [phone]   [facility]  [address] [place], [place]  [address] [phone]   Fax [phone]

## 2015-10-22 ENCOUNTER — Encounter: Payer: Self-pay | Admitting: Cardiovascular Disease

## 2015-11-23 ENCOUNTER — Telehealth: Payer: Self-pay | Admitting: Cardiovascular Disease

## 2015-11-23 ENCOUNTER — Encounter: Payer: Self-pay | Admitting: Nurse Practitioner

## 2015-11-23 MED ORDER — CLINDAMYCIN HCL 150 MG PO CAPS: 150.0000 mg | ORAL_CAPSULE | ORAL | 6 refills | Status: DC

## 2015-11-23 NOTE — Telephone Encounter (Signed)
New message      Pt is going to the dentist on 12-03-15.  She lost the antibiotic presc Dr Acie Fredrickson gave her.  Please call the antibiotic in at [facility] in [place].  Please send pt a message on mychart to let her know it has been called in

## 2015-11-23 NOTE — Telephone Encounter (Signed)
Clindamycin Rx sent to [facility] and message sent to patient on MyChart

## 2016-01-30 NOTE — Progress Notes (Signed)
Pre visit review using our clinic review tool, if applicable. No additional management support is needed unless otherwise documented below in the visit note.  Chief Complaint  Patient presents with  . Annual Exam    HPI: Patient  Tracy Chen  60 y.o. comes in today for Drain visit  She has a history of breast cancer and recent mitral valve repair. She's doing well exercise no chest pain shortness of breath is a bit overdue for her mammogram be but wanted to wait because her breast is still sore from surgery. No unusual bleeding or other change in her health. She is on antihypertensive medicine and is usually in control in the 120/80 range but it goes up when she goes to doctor's office.  Health Maintenance  Topic Date Due  . PAP SMEAR  11/13/2015  . INFLUENZA VACCINE  08/07/2016 (Originally 10/09/2015)  . HIV Screening  02/02/2017 (Originally 04/23/1970)  . MAMMOGRAM  01/08/2017  . COLONOSCOPY  05/22/2024  . TETANUS/TDAP  02/03/2026  . Hepatitis C Screening  Completed   Health Maintenance Review LIFESTYLE:  Exercise:  1 mile walking  Tobacco/ETS:n Alcohol: n Sugar beverages:n Sleep:8 hours  Drug use: no HH of  2 pet cat Work:40 +    ROS:  GEN/ HEENT: No fever, significant weight changes sweats headaches vision problems hearing changes, CV/ PULM; No chest pain shortness of breath cough, syncope,edema  change in exercise tolerance. GI /GU: No adominal pain, vomiting, change in bowel habits. No blood in the stool. No significant GU symptoms. SKIN/HEME: ,no acute skin rashes suspicious lesions or bleeding. No lymphadenopathy, nodules, masses.  NEURO/ PSYCH:  No neurologic signs such as weakness numbness. No depression anxiety. IMM/ Allergy: No unusual infections.  Allergy .   REST of 12 system review negative except as per HPI   Past Medical History:  Diagnosis Date  . Allergic rhinitis   . Breast cancer ([place])   . Gallstone   . History of chickenpox     as a child  . HX: breast cancer    hx of 2002 left er/pr 3.2cm  . 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 Mitral valve prolapse 02/21/2015  . Osteoporosis    L5  . S/P minimally invasive mitral valve repair 06/28/2015   Complex valvuloplasty including triangular resection of flail segment of posterior leaflet, artificial Gore-tex neochord placement x4 and 32 mm Sorin Memo 3D ring annuloplasty via right mini-thoracotomy incision  . Severe mitral regurgitation 02/21/2015    Past Surgical History:  Procedure Laterality Date  . abd [place]     gallstones CT gallstones 1/06  . CARDIAC CATHETERIZATION N/A 05/29/2015   Procedure: Right/Left Heart Cath and Coronary Angiography;  Surgeon: Peter M Martinique, MD;  Location: [facility];  Service: Cardiovascular;  Laterality: N/A;  . COLONOSCOPY    . MITRAL VALVE REPAIR Right 06/28/2015   Procedure: MINIMALLY INVASIVE MITRAL VALVE REPAIR (MVR);  Surgeon: Rexene Alberts, MD;  Location: [facility];  Service: Open Heart Surgery;  Laterality: Right;  32 Sorin Memo 3D Ring  . PARATHYROIDECTOMY  11/06   left inferior  . partial left mastectomy     . TEE WITHOUT CARDIOVERSION N/A 04/25/2015   Procedure: TRANSESOPHAGEAL ECHOCARDIOGRAM (TEE);  Surgeon: Thayer Headings, MD;  Location: [facility];  Service: Cardiovascular;  Laterality: N/A;  . TEE WITHOUT CARDIOVERSION N/A 06/28/2015   Procedure: TRANSESOPHAGEAL ECHOCARDIOGRAM (TEE);  Surgeon: Rexene Alberts, MD;  Location: [facility];  Service:  Open Heart Surgery;  Laterality: N/A;    Family History  Problem Relation Age of Onset  . Osteoporosis Maternal Aunt   . Colon cancer Maternal Aunt   . Thyroid disease    . Heart murmur Brother     Picked up on exam followed by echo.  . Colon cancer Mother   . Valvular heart disease Father     followed     Social History   Social History  . Marital status: Married    Spouse name: N/A  . Number of children: N/A  . Years of education: N/A    Social History Main Topics  . Smoking status: Never Smoker  . Smokeless tobacco: Never Used  . Alcohol use No  . Drug use: No  . Sexual activity: Not on file   Other Topics Concern  . Not on file   Social History Narrative   Regular exercise- some   HH of 3     Pets outside goats and chicken.   1 cat    No ets.   Now working in [employer] office . 40 hours  Stopped second job At [employer]   26+   Neg ets   G2P2   6.5-7 hours    Neg ta caffiene .     Outpatient Medications Prior to Visit  Medication Sig Dispense Refill  . aspirin EC 81 MG tablet Take 1 tablet (81 mg total) by mouth daily.    . hydrochlorothiazide (HYDRODIURIL) 25 MG tablet Take 1 tablet (25 mg total) by mouth daily. 30 tablet 11  . metoprolol tartrate (LOPRESSOR) 25 MG tablet Take 1 tablet (25 mg total) by mouth 2 (two) times daily. 60 tablet 11  . potassium chloride SA (K-DUR,KLOR-CON) 20 MEQ tablet Take 20 mEq by mouth daily.    . clindamycin (CLEOCIN) 150 MG capsule Take 1 capsule (150 mg total) by mouth as directed. Take 4 tabs 1 hour prior to dental work (Patient not taking: Reported on 02/04/2016) 4 capsule 6   No facility-administered medications prior to visit.      EXAM:  BP (!) 142/90 (BP Location: Right Arm, Patient Position: Sitting, Cuff Size: Normal)   Temp 98.5 F (36.9 C) (Oral)   Ht 5' 6.5" (1.689 m)   Wt 164 lb (74.4 kg)   BMI 26.07 kg/m   Body mass index is 26.07 kg/m.  Physical Exam: Vital signs reviewed FGH:WEXH is a well-developed well-nourished alert cooperative    who appearsr stated age in no acute distress.  HEENT: normocephalic atraumatic , Eyes: PERRL EOM's full, conjunctiva clear, Nares: paten,t no deformity discharge or tenderness., Ears: no deformity EAC's clear TMs with normal landmarks. Mouth: clear OP, no lesions, edema.  Moist mucous membranes. Dentition in adequate repair. NECK: supple without masses, thyromegaly or bruits. CHEST/PULM:  Clear to auscultation and  percussion breath sounds equal no wheeze , rales or rhonchi. No chest wall deformities or tenderness. CV: PMI is nondisplaced, S1 S2 no gallops, murmurs, rubs. Peripheral pulses are full without delay.No JVD . Breast exam left nipple absent no masses axilla clear right breast no nodules or discharge well-healing scar right axilla right upper abdomen. ABDOMEN: Bowel sounds normal nontender  No guard or rebound, no hepato splenomegal no CVA tenderness.   Extremtities:  No clubbing cyanosis or edema, no acute joint swelling or redness no focal atrophy NEURO:  Oriented x3, cranial nerves 3-12 appear to be intact, no obvious focal weakness,gait within normal limits no abnormal reflexes or asymmetrical  SKIN: No acute rashes normal turgor, color, no bruising or petechiae. PSYCH: Oriented, good eye contact, no obvious depression anxiety, cognition and judgment appear normal. LN: no cervical axillary inguinal adenopathy Pelvic: NL ext GU, labia clear without lesions or rash . Vagina no lesions .Cervix: clear  UTERUS: Neg CMT Adnexa:  clear no masses . PAP done Rectal neg smear negative heme   ASSESSMENT AND PLAN:  Discussed the following assessment and plan:  Visit for preventive health examination - Plan: CBC with Differential/Platelet, Lipid panel, TSH, Basic metabolic panel, Hepatic function panel, PAP [[facility]]  Encounter for gynecological examination without abnormal finding - Plan: PAP [[facility]]  HX: breast cancer  Need for Tdap vaccination - Plan: Tdap vaccine greater than or equal to 7yo IM  Influenza vaccination declined by patient Mitral valve repaired clinically normal exam and history. Follow-up with cardiology and her surgeon. Discussed health maintenance. Declines flu vaccine today. CPX in a year or earlier if needed. GYN exam normal. She will make sure her blood pressure is in range healthy. Patient Care Team: Burnis Medin, MD as PCP - General Gatha Mayer, MD  (Gastroenterology) Patient Instructions  Get your mammogram Make sure your blood pressure is in control when you're not in office. Let you know when the Pap results are back. You can reconsider getting a flu vaccine. Will notify you  of labs when available.  Follow ok check up in a year.  Health Maintenance, Female Introduction Adopting a healthy lifestyle and getting preventive care can go a long way to promote health and wellness. Talk with your health care provider about what schedule of regular examinations is right for you. This is a good chance for you to check in with your provider about disease prevention and staying healthy. In between checkups, there are plenty of things you can do on your own. Experts have done a lot of research about which lifestyle changes and preventive measures are most likely to keep you healthy. Ask your health care provider for more information. Weight and diet Eat a healthy diet  Be sure to include plenty of vegetables, fruits, low-fat dairy products, and lean protein.  Do not eat a lot of foods high in solid fats, added sugars, or salt.  Get regular exercise. This is one of the most important things you can do for your health.  Most adults should exercise for at least 150 minutes each week. The exercise should increase your heart rate and make you sweat (moderate-intensity exercise).  Most adults should also do strengthening exercises at least twice a week. This is in addition to the moderate-intensity exercise. Maintain a healthy weight  Body mass index (BMI) is a measurement that can be used to identify possible weight problems. It estimates body fat based on height and weight. Your health care provider can help determine your BMI and help you achieve or maintain a healthy weight.  For females 67 years of age and older:  A BMI below 18.5 is considered underweight.  A BMI of 18.5 to 24.9 is normal.  A BMI of 25 to 29.9 is considered  overweight.  A BMI of 30 and above is considered obese. Watch levels of cholesterol and blood lipids  You should start having your blood tested for lipids and cholesterol at 58 years of age, then have this test every 5 years.  You may need to have your cholesterol levels checked more often if:  Your lipid or cholesterol levels are high.  You are older than 58 years of age.  You are at high risk for heart disease. Cancer screening Lung Cancer  Lung cancer screening is recommended for adults 22-55 years old who are at high risk for lung cancer because of a history of smoking.  A yearly low-dose CT scan of the lungs is recommended for people who:  Currently smoke.  Have quit within the past 15 years.  Have at least a 30-pack-year history of smoking. A pack year is smoking an average of one pack of cigarettes a day for 1 year.  Yearly screening should continue until it has been 15 years since you quit.  Yearly screening should stop if you develop a health problem that would prevent you from having lung cancer treatment. Breast Cancer  Practice breast self-awareness. This means understanding how your breasts normally appear and feel.  It also means doing regular breast self-exams. Let your health care provider know about any changes, no matter how small.  If you are in your 20s or 30s, you should have a clinical breast exam (CBE) by a health care provider every 1-3 years as part of a regular health exam.  If you are 44 or older, have a CBE every year. Also consider having a breast X-ray (mammogram) every year.  If you have a family history of breast cancer, talk to your health care provider about genetic screening.  If you are at high risk for breast cancer, talk to your health care provider about having an MRI and a mammogram every year.  Breast cancer gene (BRCA) assessment is recommended for women who have family members with BRCA-related cancers. BRCA-related cancers  include:  Breast.  Ovarian.  Tubal.  Peritoneal cancers.  Results of the assessment will determine the need for genetic counseling and BRCA1 and BRCA2 testing. Cervical Cancer  Your health care provider may recommend that you be screened regularly for cancer of the pelvic organs (ovaries, uterus, and vagina). This screening involves a pelvic examination, including checking for microscopic changes to the surface of your cervix (Pap test). You may be encouraged to have this screening done every 3 years, beginning at age 27.  For women ages 48-65, health care providers may recommend pelvic exams and Pap testing every 3 years, or they may recommend the Pap and pelvic exam, combined with testing for human papilloma virus (HPV), every 5 years. Some types of HPV increase your risk of cervical cancer. Testing for HPV may also be done on women of any age with unclear Pap test results.  Other health care providers may not recommend any screening for nonpregnant women who are considered low risk for pelvic cancer and who do not have symptoms. Ask your health care provider if a screening pelvic exam is right for you.  If you have had past treatment for cervical cancer or a condition that could lead to cancer, you need Pap tests and screening for cancer for at least 20 years after your treatment. If Pap tests have been discontinued, your risk factors (such as having a new sexual partner) need to be reassessed to determine if screening should resume. Some women have medical problems that increase the chance of getting cervical cancer. In these cases, your health care provider may recommend more frequent screening and Pap tests. Colorectal Cancer  This type of cancer can be detected and often prevented.  Routine colorectal cancer screening usually begins at 58 years of age and continues through 58 years of age.  Your health  care provider may recommend screening at an earlier age if you have risk factors  for colon cancer.  Your health care provider may also recommend using home test kits to check for hidden blood in the stool.  A small camera at the end of a tube can be used to examine your colon directly (sigmoidoscopy or colonoscopy). This is done to check for the earliest forms of colorectal cancer.  Routine screening usually begins at age 77.  Direct examination of the colon should be repeated every 5-10 years through 58 years of age. However, you may need to be screened more often if early forms of precancerous polyps or small growths are found. Skin Cancer  Check your skin from head to toe regularly.  Tell your health care provider about any new moles or changes in moles, especially if there is a change in a mole's shape or color.  Also tell your health care provider if you have a mole that is larger than the size of a pencil eraser.  Always use sunscreen. Apply sunscreen liberally and repeatedly throughout the day.  Protect yourself by wearing long sleeves, pants, a wide-brimmed hat, and sunglasses whenever you are outside. Heart disease, diabetes, and high blood pressure  High blood pressure causes heart disease and increases the risk of stroke. High blood pressure is more likely to develop in:  People who have blood pressure in the high end of the normal range (130-139/85-89 mm Hg).  People who are overweight or obese.  People who are African American.  If you are 109-13 years of age, have your blood pressure checked every 3-5 years. If you are 45 years of age or older, have your blood pressure checked every year. You should have your blood pressure measured twice-once when you are at a hospital or clinic, and once when you are not at a hospital or clinic. Record the average of the two measurements. To check your blood pressure when you are not at a hospital or clinic, you can use:  An automated blood pressure machine at a pharmacy.  A home blood pressure monitor.  If you  are between 18 years and 10 years old, ask your health care provider if you should take aspirin to prevent strokes.  Have regular diabetes screenings. This involves taking a blood sample to check your fasting blood sugar level.  If you are at a normal weight and have a low risk for diabetes, have this test once every three years after 58 years of age.  If you are overweight and have a high risk for diabetes, consider being tested at a younger age or more often. Preventing infection Hepatitis B  If you have a higher risk for hepatitis B, you should be screened for this virus. You are considered at high risk for hepatitis B if:  You were born in a country where hepatitis B is common. Ask your health care provider which countries are considered high risk.  Your parents were born in a high-risk country, and you have not been immunized against hepatitis B (hepatitis B vaccine).  You have HIV or AIDS.  You use needles to inject street drugs.  You live with someone who has hepatitis B.  You have had sex with someone who has hepatitis B.  You get hemodialysis treatment.  You take certain medicines for conditions, including cancer, organ transplantation, and autoimmune conditions. Hepatitis C  Blood testing is recommended for:  Everyone born from [birth year] through 1963.  Anyone with known  risk factors for hepatitis C. Sexually transmitted infections (STIs)  You should be screened for sexually transmitted infections (STIs) including gonorrhea and chlamydia if:  You are sexually active and are younger than 58 years of age.  You are older than 58 years of age and your health care provider tells you that you are at risk for this type of infection.  Your sexual activity has changed since you were last screened and you are at an increased risk for chlamydia or gonorrhea. Ask your health care provider if you are at risk.  If you do not have HIV, but are at risk, it may be recommended that you  take a prescription medicine daily to prevent HIV infection. This is called pre-exposure prophylaxis (PrEP). You are considered at risk if:  You are sexually active and do not regularly use condoms or know the HIV status of your partner(s).  You take drugs by injection.  You are sexually active with a partner who has HIV. Talk with your health care provider about whether you are at high risk of being infected with HIV. If you choose to begin PrEP, you should first be tested for HIV. You should then be tested every 3 months for as long as you are taking PrEP. Pregnancy  If you are premenopausal and you may become pregnant, ask your health care provider about preconception counseling.  If you may become pregnant, take 400 to 800 micrograms (mcg) of folic acid every day.  If you want to prevent pregnancy, talk to your health care provider about birth control (contraception). Osteoporosis and menopause  Osteoporosis is a disease in which the bones lose minerals and strength with aging. This can result in serious bone fractures. Your risk for osteoporosis can be identified using a bone density scan.  If you are 33 years of age or older, or if you are at risk for osteoporosis and fractures, ask your health care provider if you should be screened.  Ask your health care provider whether you should take a calcium or vitamin D supplement to lower your risk for osteoporosis.  Menopause may have certain physical symptoms and risks.  Hormone replacement therapy may reduce some of these symptoms and risks. Talk to your health care provider about whether hormone replacement therapy is right for you. Follow these instructions at home:  Schedule regular health, dental, and eye exams.  Stay current with your immunizations.  Do not use any tobacco products including cigarettes, chewing tobacco, or electronic cigarettes.  If you are pregnant, do not drink alcohol.  If you are breastfeeding, limit  how much and how often you drink alcohol.  Limit alcohol intake to no more than 1 drink per day for nonpregnant women. One drink equals 12 ounces of beer, 5 ounces of wine, or 1 ounces of hard liquor.  Do not use street drugs.  Do not share needles.  Ask your health care provider for help if you need support or information about quitting drugs.  Tell your health care provider if you often feel depressed.  Tell your health care provider if you have ever been abused or do not feel safe at home. This information is not intended to replace advice given to you by your health care provider. Make sure you discuss any questions you have with your health care provider. Document Released: 09/09/2010 Document Revised: 08/02/2015 Document Reviewed: 11/28/2014  2017 [employer]vier    Mariann Laster K. Sayge Brienza M.D.

## 2016-02-04 ENCOUNTER — Encounter: Payer: Self-pay | Admitting: Internal Medicine

## 2016-02-04 ENCOUNTER — Other Ambulatory Visit (HOSPITAL_COMMUNITY)
Admission: RE | Admit: 2016-02-04 | Discharge: 2016-02-04 | Disposition: A | Discharge status: Home / Self Care | Payer: BLUE CROSS/BLUE SHIELD | Source: Ambulatory Visit | Attending: Internal Medicine | Admitting: Internal Medicine

## 2016-02-04 ENCOUNTER — Ambulatory Visit (INDEPENDENT_AMBULATORY_CARE_PROVIDER_SITE_OTHER): Payer: BLUE CROSS/BLUE SHIELD | Admitting: Internal Medicine

## 2016-02-04 VITALS — BP 142/90 | Temp 98.5°F | Ht 66.5 in | Wt 164.0 lb

## 2016-02-04 DIAGNOSIS — Z01419 Encounter for gynecological examination (general) (routine) without abnormal findings: Secondary | ICD-10-CM | POA: Insufficient documentation

## 2016-02-04 DIAGNOSIS — Z853 Personal history of malignant neoplasm of breast: Secondary | ICD-10-CM

## 2016-02-04 DIAGNOSIS — Z2821 Immunization not carried out because of patient refusal: Secondary | ICD-10-CM | POA: Diagnosis not present

## 2016-02-04 DIAGNOSIS — Z0000 Encounter for general adult medical examination without abnormal findings: Secondary | ICD-10-CM

## 2016-02-04 DIAGNOSIS — Z23 Encounter for immunization: Secondary | ICD-10-CM

## 2016-02-04 LAB — HEPATIC FUNCTION PANEL
ALK PHOS: 90 U/L (ref 39–117)
ALT: 18 U/L (ref 0–35)
AST: 16 U/L (ref 0–37)
Albumin: 4.3 g/dL (ref 3.5–5.2)
BILIRUBIN DIRECT: 0.1 mg/dL (ref 0.0–0.3)
TOTAL PROTEIN: 7.2 g/dL (ref 6.0–8.3)
Total Bilirubin: 0.5 mg/dL (ref 0.2–1.2)

## 2016-02-04 LAB — CBC WITH DIFFERENTIAL/PLATELET
Basophils Absolute: 0 10*3/uL (ref 0.0–0.1)
Basophils Relative: 0.4 % (ref 0.0–3.0)
EOS PCT: 1.8 % (ref 0.0–5.0)
Eosinophils Absolute: 0.1 10*3/uL (ref 0.0–0.7)
HCT: 38.4 % (ref 36.0–46.0)
Hemoglobin: 12.9 g/dL (ref 12.0–15.0)
Lymphocytes Relative: 24.5 % (ref 12.0–46.0)
Lymphs Abs: 1.7 10*3/uL (ref 0.7–4.0)
MCHC: 33.6 g/dL (ref 30.0–36.0)
MCV: 84.5 fl (ref 78.0–100.0)
MONO ABS: 0.5 10*3/uL (ref 0.1–1.0)
Monocytes Relative: 7.8 % (ref 3.0–12.0)
NEUTROS ABS: 4.5 10*3/uL (ref 1.4–7.7)
Neutrophils Relative %: 65.5 % (ref 43.0–77.0)
Platelets: 345 10*3/uL (ref 150.0–400.0)
RBC: 4.55 Mil/uL (ref 3.87–5.11)
RDW: 14.5 % (ref 11.5–15.5)
WBC: 6.8 10*3/uL (ref 4.0–10.5)

## 2016-02-04 LAB — BASIC METABOLIC PANEL
BUN: 10 mg/dL (ref 6–23)
CALCIUM: 9.7 mg/dL (ref 8.4–10.5)
CHLORIDE: 101 meq/L (ref 96–112)
CO2: 32 meq/L (ref 19–32)
Creatinine, Ser: 0.65 mg/dL (ref 0.40–1.20)
GFR: 99.23 mL/min (ref >60.00)
Glucose, Bld: 86 mg/dL (ref 70–99)
Potassium: 3.6 mEq/L (ref 3.5–5.1)
SODIUM: 140 meq/L (ref 135–145)

## 2016-02-04 LAB — LIPID PANEL
CHOLESTEROL: 160 mg/dL (ref 0–200)
HDL: 45.7 mg/dL (ref >39.00)
LDL Cholesterol: 85 mg/dL (ref 0–99)
NONHDL: 113.8
Total CHOL/HDL Ratio: 3
Triglycerides: 143 mg/dL (ref 0.0–149.0)
VLDL: 28.6 mg/dL (ref 0.0–40.0)

## 2016-02-04 LAB — TSH: TSH: 1.13 u[IU]/mL (ref 0.35–4.50)

## 2016-02-04 NOTE — Patient Instructions (Addendum)
Get your mammogram Make sure your blood pressure is in control when you're not in office. Let you know when the Pap results are back. You can reconsider getting a flu vaccine. Will notify you  of labs when available.  Follow ok check up in a year.  Health Maintenance, Female Introduction Adopting a healthy lifestyle and getting preventive care can go a long way to promote health and wellness. Talk with your health care provider about what schedule of regular examinations is right for you. This is a good chance for you to check in with your provider about disease prevention and staying healthy. In between checkups, there are plenty of things you can do on your own. Experts have done a lot of research about which lifestyle changes and preventive measures are most likely to keep you healthy. Ask your health care provider for more information. Weight and diet Eat a healthy diet  Be sure to include plenty of vegetables, fruits, low-fat dairy products, and lean protein.  Do not eat a lot of foods high in solid fats, added sugars, or salt.  Get regular exercise. This is one of the most important things you can do for your health.  Most adults should exercise for at least 150 minutes each week. The exercise should increase your heart rate and make you sweat (moderate-intensity exercise).  Most adults should also do strengthening exercises at least twice a week. This is in addition to the moderate-intensity exercise. Maintain a healthy weight  Body mass index (BMI) is a measurement that can be used to identify possible weight problems. It estimates body fat based on height and weight. Your health care provider can help determine your BMI and help you achieve or maintain a healthy weight.  For females 87 years of age and older:  A BMI below 18.5 is considered underweight.  A BMI of 18.5 to 24.9 is normal.  A BMI of 25 to 29.9 is considered overweight.  A BMI of 30 and above is considered  obese. Watch levels of cholesterol and blood lipids  You should start having your blood tested for lipids and cholesterol at 58 years of age, then have this test every 5 years.  You may need to have your cholesterol levels checked more often if:  Your lipid or cholesterol levels are high.  You are older than 58 years of age.  You are at high risk for heart disease. Cancer screening Lung Cancer  Lung cancer screening is recommended for adults 37-65 years old who are at high risk for lung cancer because of a history of smoking.  A yearly low-dose CT scan of the lungs is recommended for people who:  Currently smoke.  Have quit within the past 15 years.  Have at least a 30-pack-year history of smoking. A pack year is smoking an average of one pack of cigarettes a day for 1 year.  Yearly screening should continue until it has been 15 years since you quit.  Yearly screening should stop if you develop a health problem that would prevent you from having lung cancer treatment. Breast Cancer  Practice breast self-awareness. This means understanding how your breasts normally appear and feel.  It also means doing regular breast self-exams. Let your health care provider know about any changes, no matter how small.  If you are in your 20s or 30s, you should have a clinical breast exam (CBE) by a health care provider every 1-3 years as part of a regular health exam.  If you are 40 or older, have a CBE every year. Also consider having a breast X-ray (mammogram) every year.  If you have a family history of breast cancer, talk to your health care provider about genetic screening.  If you are at high risk for breast cancer, talk to your health care provider about having an MRI and a mammogram every year.  Breast cancer gene (BRCA) assessment is recommended for women who have family members with BRCA-related cancers. BRCA-related cancers include:  Breast.  Ovarian.  Tubal.  Peritoneal  cancers.  Results of the assessment will determine the need for genetic counseling and BRCA1 and BRCA2 testing. Cervical Cancer  Your health care provider may recommend that you be screened regularly for cancer of the pelvic organs (ovaries, uterus, and vagina). This screening involves a pelvic examination, including checking for microscopic changes to the surface of your cervix (Pap test). You may be encouraged to have this screening done every 3 years, beginning at age 45.  For women ages 18-65, health care providers may recommend pelvic exams and Pap testing every 3 years, or they may recommend the Pap and pelvic exam, combined with testing for human papilloma virus (HPV), every 5 years. Some types of HPV increase your risk of cervical cancer. Testing for HPV may also be done on women of any age with unclear Pap test results.  Other health care providers may not recommend any screening for nonpregnant women who are considered low risk for pelvic cancer and who do not have symptoms. Ask your health care provider if a screening pelvic exam is right for you.  If you have had past treatment for cervical cancer or a condition that could lead to cancer, you need Pap tests and screening for cancer for at least 20 years after your treatment. If Pap tests have been discontinued, your risk factors (such as having a new sexual partner) need to be reassessed to determine if screening should resume. Some women have medical problems that increase the chance of getting cervical cancer. In these cases, your health care provider may recommend more frequent screening and Pap tests. Colorectal Cancer  This type of cancer can be detected and often prevented.  Routine colorectal cancer screening usually begins at 58 years of age and continues through 58 years of age.  Your health care provider may recommend screening at an earlier age if you have risk factors for colon cancer.  Your health care provider may also  recommend using home test kits to check for hidden blood in the stool.  A small camera at the end of a tube can be used to examine your colon directly (sigmoidoscopy or colonoscopy). This is done to check for the earliest forms of colorectal cancer.  Routine screening usually begins at age 34.  Direct examination of the colon should be repeated every 5-10 years through 58 years of age. However, you may need to be screened more often if early forms of precancerous polyps or small growths are found. Skin Cancer  Check your skin from head to toe regularly.  Tell your health care provider about any new moles or changes in moles, especially if there is a change in a mole's shape or color.  Also tell your health care provider if you have a mole that is larger than the size of a pencil eraser.  Always use sunscreen. Apply sunscreen liberally and repeatedly throughout the day.  Protect yourself by wearing long sleeves, pants, a wide-brimmed hat, and sunglasses whenever  you are outside. Heart disease, diabetes, and high blood pressure  High blood pressure causes heart disease and increases the risk of stroke. High blood pressure is more likely to develop in:  People who have blood pressure in the high end of the normal range (130-139/85-89 mm Hg).  People who are overweight or obese.  People who are African American.  If you are 47-59 years of age, have your blood pressure checked every 3-5 years. If you are 84 years of age or older, have your blood pressure checked every year. You should have your blood pressure measured twice-once when you are at a hospital or clinic, and once when you are not at a hospital or clinic. Record the average of the two measurements. To check your blood pressure when you are not at a hospital or clinic, you can use:  An automated blood pressure machine at a pharmacy.  A home blood pressure monitor.  If you are between 47 years and 5 years old, ask your health  care provider if you should take aspirin to prevent strokes.  Have regular diabetes screenings. This involves taking a blood sample to check your fasting blood sugar level.  If you are at a normal weight and have a low risk for diabetes, have this test once every three years after 58 years of age.  If you are overweight and have a high risk for diabetes, consider being tested at a younger age or more often. Preventing infection Hepatitis B  If you have a higher risk for hepatitis B, you should be screened for this virus. You are considered at high risk for hepatitis B if:  You were born in a country where hepatitis B is common. Ask your health care provider which countries are considered high risk.  Your parents were born in a high-risk country, and you have not been immunized against hepatitis B (hepatitis B vaccine).  You have HIV or AIDS.  You use needles to inject street drugs.  You live with someone who has hepatitis B.  You have had sex with someone who has hepatitis B.  You get hemodialysis treatment.  You take certain medicines for conditions, including cancer, organ transplantation, and autoimmune conditions. Hepatitis C  Blood testing is recommended for:  Everyone born from [birth year] through 1963.  Anyone with known risk factors for hepatitis C. Sexually transmitted infections (STIs)  You should be screened for sexually transmitted infections (STIs) including gonorrhea and chlamydia if:  You are sexually active and are younger than 58 years of age.  You are older than 58 years of age and your health care provider tells you that you are at risk for this type of infection.  Your sexual activity has changed since you were last screened and you are at an increased risk for chlamydia or gonorrhea. Ask your health care provider if you are at risk.  If you do not have HIV, but are at risk, it may be recommended that you take a prescription medicine daily to prevent HIV  infection. This is called pre-exposure prophylaxis (PrEP). You are considered at risk if:  You are sexually active and do not regularly use condoms or know the HIV status of your partner(s).  You take drugs by injection.  You are sexually active with a partner who has HIV. Talk with your health care provider about whether you are at high risk of being infected with HIV. If you choose to begin PrEP, you should first be tested for HIV.  You should then be tested every 3 months for as long as you are taking PrEP. Pregnancy  If you are premenopausal and you may become pregnant, ask your health care provider about preconception counseling.  If you may become pregnant, take 400 to 800 micrograms (mcg) of folic acid every day.  If you want to prevent pregnancy, talk to your health care provider about birth control (contraception). Osteoporosis and menopause  Osteoporosis is a disease in which the bones lose minerals and strength with aging. This can result in serious bone fractures. Your risk for osteoporosis can be identified using a bone density scan.  If you are 32 years of age or older, or if you are at risk for osteoporosis and fractures, ask your health care provider if you should be screened.  Ask your health care provider whether you should take a calcium or vitamin D supplement to lower your risk for osteoporosis.  Menopause may have certain physical symptoms and risks.  Hormone replacement therapy may reduce some of these symptoms and risks. Talk to your health care provider about whether hormone replacement therapy is right for you. Follow these instructions at home:  Schedule regular health, dental, and eye exams.  Stay current with your immunizations.  Do not use any tobacco products including cigarettes, chewing tobacco, or electronic cigarettes.  If you are pregnant, do not drink alcohol.  If you are breastfeeding, limit how much and how often you drink alcohol.  Limit  alcohol intake to no more than 1 drink per day for nonpregnant women. One drink equals 12 ounces of beer, 5 ounces of wine, or 1 ounces of hard liquor.  Do not use street drugs.  Do not share needles.  Ask your health care provider for help if you need support or information about quitting drugs.  Tell your health care provider if you often feel depressed.  Tell your health care provider if you have ever been abused or do not feel safe at home. This information is not intended to replace advice given to you by your health care provider. Make sure you discuss any questions you have with your health care provider. Document Released: 09/09/2010 Document Revised: 08/02/2015 Document Reviewed: 11/28/2014  2017 Elsevier

## 2016-02-06 LAB — CYTOLOGY - PAP: DIAGNOSIS: NEGATIVE

## 2016-02-08 ENCOUNTER — Encounter: Payer: Self-pay | Admitting: Family Medicine

## 2016-02-26 ENCOUNTER — Other Ambulatory Visit: Payer: Self-pay | Admitting: Internal Medicine

## 2016-02-26 DIAGNOSIS — Z1231 Encounter for screening mammogram for malignant neoplasm of breast: Secondary | ICD-10-CM

## 2016-03-14 ENCOUNTER — Ambulatory Visit
Admission: RE | Admit: 2016-03-14 | Discharge: 2016-03-14 | Disposition: A | Discharge status: Home / Self Care | Payer: BLUE CROSS/BLUE SHIELD | Source: Ambulatory Visit | Attending: Internal Medicine | Admitting: Internal Medicine

## 2016-03-14 DIAGNOSIS — Z1231 Encounter for screening mammogram for malignant neoplasm of breast: Secondary | ICD-10-CM | POA: Diagnosis not present

## 2016-03-26 ENCOUNTER — Ambulatory Visit: Payer: BLUE CROSS/BLUE SHIELD | Admitting: Cardiovascular Disease

## 2016-03-27 ENCOUNTER — Other Ambulatory Visit: Payer: Self-pay | Admitting: Cardiovascular Disease

## 2016-03-28 ENCOUNTER — Other Ambulatory Visit: Payer: Self-pay | Admitting: Cardiovascular Disease

## 2016-03-28 NOTE — Telephone Encounter (Signed)
New Message     *STAT* If patient is at the pharmacy, call can be transferred to refill team.   1. Which medications need to be refilled? (please list name of each medication and dose if known)   potassium chloride SA (K-DUR,KLOR-CON) 20 MEQ tablet Take 20 mEq by mouth daily   hydrochlorothiazide (HYDRODIURIL) 25 MG tablet Take 1 tablet (25 mg total) by mouth daily.     2. Which pharmacy/location (including street and city if local pharmacy) is medication to be sent to? [facility] on [address] [place]   3. Do they need a 30 day or 90 day supply? 90 if possible

## 2016-04-10 NOTE — Progress Notes (Signed)
Cardiology Office Note:    Date:  04/11/2016   ID:  Tracy Chen, DOB 1955/04/16, MRN [id number]  PCP:  Lottie Dawson, MD  Cardiologist:  Dr. Liam Rogers   Electrophysiologist:  n/a  Referring MD: Burnis Medin, MD   Chief Complaint  Patient presents with  . Follow-up    s/p MV repair    History of Present Illness:    Tracy Chen is a 61 y.o. female with a hx of MV regurgitation s/p minimally invasive MV repair in 4/17 by Dr. Roxy Manns.  FU echocardiogram in 6/17 demonstrated well functioning MV repair and mildly reduced LVF with ejection fraction 40-45.  Last seen by Dr. Liam Rogers in 7/17.  She is here alone.  She is doing well.  She denies chest pain, shortness of breath, syncope, orthopnea, PND or significant pedal edema.  She exercises several times a week without limitation.  She is to see Dr. Roxy Manns again in April.   Prior CV studies that were reviewed today include:    Echo 08/30/15 Mild LVH, EF 40-45, diff HK, Gr 1 DD, GLS -13.9, MV repair ok with no stenosis and trivial MR (mean 5), mild BAE, PASP 23  LHC 3/17 1. Normal coronary anatomy 2. Normal LV function 3. Normal right heart pressures. 4. Moderate to severe mitral insufficiency  Carotid US 4/17 No ICA stenosis  Past Medical History:  Diagnosis Date  . Allergic rhinitis   . Breast cancer ([place])   . Gallstone   . History of chickenpox    as a child  . HX: breast cancer    hx of 2002 left er/pr 3.2cm  . 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 Mitral valve prolapse 02/21/2015  . Osteoporosis    L5  . S/P minimally invasive mitral valve repair 06/28/2015   Complex valvuloplasty including triangular resection of flail segment of posterior leaflet, artificial Gore-tex neochord placement x4 and 32 mm Sorin Memo 3D ring annuloplasty via right mini-thoracotomy incision  . Severe mitral regurgitation 02/21/2015    Past Surgical History:  Procedure Laterality Date  . abd [place]     gallstones  CT gallstones 1/06  . CARDIAC CATHETERIZATION N/A 05/29/2015   Procedure: Right/Left Heart Cath and Coronary Angiography;  Surgeon: Peter M Martinique, MD;  Location: [facility];  Service: Cardiovascular;  Laterality: N/A;  . COLONOSCOPY    . MITRAL VALVE REPAIR Right 06/28/2015   Procedure: MINIMALLY INVASIVE MITRAL VALVE REPAIR (MVR);  Surgeon: Rexene Alberts, MD;  Location: [facility];  Service: Open Heart Surgery;  Laterality: Right;  32 Sorin Memo 3D Ring  . PARATHYROIDECTOMY  11/06   left inferior  . partial left mastectomy     . TEE WITHOUT CARDIOVERSION N/A 04/25/2015   Procedure: TRANSESOPHAGEAL ECHOCARDIOGRAM (TEE);  Surgeon: Thayer Headings, MD;  Location: [facility];  Service: Cardiovascular;  Laterality: N/A;  . TEE WITHOUT CARDIOVERSION N/A 06/28/2015   Procedure: TRANSESOPHAGEAL ECHOCARDIOGRAM (TEE);  Surgeon: Rexene Alberts, MD;  Location: [facility];  Service: Open Heart Surgery;  Laterality: N/A;    Current Medications: Current Meds  Medication Sig  . aspirin EC 81 MG tablet Take 1 tablet (81 mg total) by mouth daily.  . clindamycin (CLEOCIN) 150 MG capsule Take 1 capsule (150 mg total) by mouth as directed. Take 4 tabs 1 hour prior to dental work  . hydrochlorothiazide (HYDRODIURIL) 25 MG tablet TAKE ONE TABLET BY MOUTH ONCE DAILY  . metoprolol tartrate (LOPRESSOR) 25  MG tablet Take 1 tablet (25 mg total) by mouth 2 (two) times daily.  . potassium chloride SA (K-DUR,KLOR-CON) 20 MEQ tablet Take 20 mEq by mouth daily.     Allergies:   Penicillins   Social History   Social History  . Marital status: Married    Spouse name: N/A  . Number of children: N/A  . Years of education: N/A   Social History Main Topics  . Smoking status: Never Smoker  . Smokeless tobacco: Never Used  . Alcohol use No  . Drug use: No  . Sexual activity: Not Asked   Other Topics Concern  . None   Social History Narrative   Regular exercise- some   HH of 3     Pets outside goats and  chicken.   1 cat    No ets.   Now working in [employer] office . 40 hours  Stopped second job At [employer]   26+   Neg ets   G2P2   6.5-7 hours    Neg ta caffiene .      Family History:  The patient's family history includes Colon cancer in her maternal aunt and mother; Heart murmur in her brother; Osteoporosis in her maternal aunt; Valvular heart disease in her father.   ROS:   Please see the history of present illness.    ROS All other systems reviewed and are negative.   EKGs/Labs/Other Test Reviewed:    EKG:  EKG is  ordered today.  The ekg ordered today demonstrates NSR, HR 80, leftward axis, nonspecific ST-T wave changes, QTC 493 ms, no significant change when compared to prior tracing dated 06/09/15  Recent Labs: 06/29/2015: Magnesium 2.2 02/04/2016: ALT 18; BUN 10; Creatinine, Ser 0.65; Hemoglobin 12.9; Platelets 345.0; Potassium 3.6; Sodium 140; TSH 1.13   Recent Lipid Panel    Component Value Date/Time   CHOL 160 02/04/2016 1056   TRIG 143.0 02/04/2016 1056   TRIG 62 12/26/2005 1000   HDL 45.70 02/04/2016 1056   CHOLHDL 3 02/04/2016 1056   VLDL 28.6 02/04/2016 1056   LDLCALC 85 02/04/2016 1056     Physical Exam:    VS:  BP 120/70   Pulse 80   Ht 5\' 7"  (1.702 m)   Wt 166 lb 1.9 oz (75.4 kg)   BMI 26.02 kg/m     Wt Readings from Last 3 Encounters:  04/11/16 166 lb 1.9 oz (75.4 kg)  02/04/16 164 lb (74.4 kg)  09/27/15 157 lb 12.8 oz (71.6 kg)     Physical Exam  Constitutional: She is oriented to person, place, and time. She appears well-developed and well-nourished. No distress.  HENT:  Head: Normocephalic and atraumatic.  Eyes: No scleral icterus.  Neck: Normal range of motion. No JVD present.  Cardiovascular: Normal rate, regular rhythm, S1 normal, S2 normal and normal heart sounds.   No murmur heard. Pulmonary/Chest: Breath sounds normal. She has no wheezes. She has no rhonchi. She has no rales.  Abdominal: Soft. There is no tenderness.    Musculoskeletal: She exhibits no edema.  Neurological: She is alert and oriented to person, place, and time.  Skin: Skin is warm and dry.  Psychiatric: She has a normal mood and affect.    ASSESSMENT:    1. S/P mitral valve repair   2. Dilated cardiomyopathy ([place])    PLAN:    In order of problems listed above:  1. S/p MV repair - She is doing well.  She has a Rx  for prophylactic antibiotics for any dental work.  She sees Dr. Roxy Manns again in April.  2. Dilated CM - EF was 40-45 at Echo in 6/17.  She has no symptoms of shortness of breath or fatigue.  She has no signs of congestive heart failure.  Continue beta-blocker.  Plan follow up echocardiogram in 6 months.   Medication Adjustments/Labs and Tests Ordered: Current medicines are reviewed at length with the patient today.  Concerns regarding medicines are outlined above.  Medication changes, Labs and Tests ordered today are outlined in the Patient Instructions noted below. Patient Instructions  Medication Instructions:  No changes.  See your medication list.   Labwork: None   Testing/Procedures: Your provider has requested that you have an echocardiogram. Echocardiography is a painless test that uses sound waves to create images of your heart. It provides your doctor with information about the size and shape of your heart and how well your heart's chambers and valves are working. This procedure takes approximately one hour. There are no restrictions for this procedure.   Schedule echocardiogram in 6 months (in August 2018).  Follow-Up: Dr. Liam Rogers in 6 months - Appointment should be after the echocardiogram is completed.  Any Other Special Instructions Will Be Listed Below (If Applicable).  If you need a refill on your cardiac medications before your next appointment, please call your pharmacy.   Signed, Richardson Dopp, PA-C  04/11/2016 8:44 AM    [facility] [address], [place], [place]   [address] Phone: [phone]; Fax: [phone]

## 2016-04-11 ENCOUNTER — Encounter: Payer: Self-pay | Admitting: Physician Assistant

## 2016-04-11 ENCOUNTER — Ambulatory Visit (INDEPENDENT_AMBULATORY_CARE_PROVIDER_SITE_OTHER): Payer: BLUE CROSS/BLUE SHIELD | Admitting: Physician Assistant

## 2016-04-11 VITALS — BP 120/70 | HR 80 | Ht 67.0 in | Wt 166.1 lb

## 2016-04-11 DIAGNOSIS — I420 Dilated cardiomyopathy: Secondary | ICD-10-CM

## 2016-04-11 DIAGNOSIS — Z9889 Other specified postprocedural states: Secondary | ICD-10-CM | POA: Diagnosis not present

## 2016-04-11 NOTE — Patient Instructions (Signed)
Medication Instructions:  No changes.  See your medication list.   Labwork: None   Testing/Procedures: Your provider has requested that you have an echocardiogram. Echocardiography is a painless test that uses sound waves to create images of your heart. It provides your doctor with information about the size and shape of your heart and how well your heart's chambers and valves are working. This procedure takes approximately one hour. There are no restrictions for this procedure.   Schedule echocardiogram in 6 months (in August 2018).  Follow-Up: Dr. Liam Rogers in 6 months - Appointment should be after the echocardiogram is completed.  Any Other Special Instructions Will Be Listed Below (If Applicable).  If you need a refill on your cardiac medications before your next appointment, please call your pharmacy.

## 2016-06-17 ENCOUNTER — Other Ambulatory Visit: Payer: Self-pay | Admitting: Cardiovascular Disease

## 2016-06-20 ENCOUNTER — Other Ambulatory Visit: Payer: Self-pay | Admitting: Cardiovascular Disease

## 2016-06-30 ENCOUNTER — Encounter: Payer: Self-pay | Admitting: Thoracic Surgery (Cardiothoracic Vascular Surgery)

## 2016-06-30 ENCOUNTER — Ambulatory Visit (INDEPENDENT_AMBULATORY_CARE_PROVIDER_SITE_OTHER): Payer: BLUE CROSS/BLUE SHIELD | Admitting: Thoracic Surgery (Cardiothoracic Vascular Surgery)

## 2016-06-30 VITALS — BP 118/75 | HR 82 | Resp 20 | Ht 67.0 in | Wt 156.0 lb

## 2016-06-30 DIAGNOSIS — I340 Nonrheumatic mitral (valve) insufficiency: Secondary | ICD-10-CM | POA: Diagnosis not present

## 2016-06-30 DIAGNOSIS — Z9889 Other specified postprocedural states: Secondary | ICD-10-CM

## 2016-06-30 DIAGNOSIS — I341 Nonrheumatic mitral (valve) prolapse: Secondary | ICD-10-CM

## 2016-06-30 NOTE — Patient Instructions (Signed)

## 2016-06-30 NOTE — Progress Notes (Signed)
Tracy Chen [address]       [place] [address]             [phone]     CARDIOTHORACIC SURGERY OFFICE NOTE  Referring Provider is Chen, Wonda Cheng, MD PCP is Tracy Dawson, MD   HPI:  Patient is a 61 year old female who returns to the office today for routine follow-up approximately one year status post minimally invasive mitral valve repair on 06/28/2015 for mitral valve prolapse with severe primary mitral regurgitation. Postoperative recovery was uneventful and she was last seen here in our office on 08/27/2015 at which time she was doing well. She underwent routine follow-up echocardiogram on 08/30/2015 that revealed intact mitral valve repair with trivial residual mitral regurgitation. Left ventricular function was mildly reduced with ejection fraction estimated 40-45%. Since then she has been seen in follow-up by Tracy Chen and she returns to our office for follow-up today.  She reports that overall she has been doing remarkably well. She has returned to completely normal physical activity and she reports no limitations whatsoever. She specifically denies any exertional shortness of breath or chest discomfort. Her activity level is quite good and her exercise tolerance is normal. She denies any palpitations or dizzy spells. She is pleased with her progress.  Current Outpatient Prescriptions  Medication Sig Dispense Refill  . aspirin EC 81 MG tablet Take 1 tablet (81 mg total) by mouth daily.    . clindamycin (CLEOCIN) 150 MG capsule Take 1 capsule (150 mg total) by mouth as directed. Take 4 tabs 1 hour prior to dental work 4 capsule 6  . hydrochlorothiazide (HYDRODIURIL) 25 MG tablet TAKE ONE TABLET BY MOUTH ONCE DAILY 90 tablet 2  . KLOR-CON M20 20 MEQ tablet TAKE ONE TABLET BY MOUTH ONCE DAILY 90 tablet 0  . metoprolol tartrate (LOPRESSOR) 25 MG tablet Take 1 tablet (25 mg total) by mouth 2 (two) times daily. 60 tablet 11  . potassium chloride SA  (K-DUR,KLOR-CON) 20 MEQ tablet Take 20 mEq by mouth daily.     No current facility-administered medications for this visit.       Physical Exam:   BP 118/75   Pulse 82   Resp 20   Ht 5\' 7"  (1.702 m)   Wt 156 lb (70.8 kg)   SpO2 97%   BMI 24.43 kg/m   General:  Well-appearing  Chest:   Clear to auscultation  CV:   Regular rate and rhythm without murmur  Incisions:  Completely healed  Abdomen:  Soft nontender  Extremities:  Warm and well-perfused  Diagnostic Tests:  Transthoracic Echocardiography  Patient:    Tracy Chen MR #:       [id number] Study Date: 08/30/2015 Gender:     F Age:        57 Height:     171.5 cm Weight:     68.5 kg BSA:        1.81 m^2 Pt. Status: Room:   REFERRING    Mertie Moores, M.D.  PERFORMING   [facility], Outpatient  SONOGRAPHER  [facility], RDCS  ATTENDING    Chen, Tracy Juba     Tracy Chen  cc:  ------------------------------------------------------------------- LV EF: 40% -   45%  ------------------------------------------------------------------- Indications:      Mitral Valve Repair (Z98.890).  ------------------------------------------------------------------- History:   PMH:  History of Breast Cancer  Mitral valve disease.  Risk factors:  Family history of coronary artery disease.  ------------------------------------------------------------------- Study Conclusions  -  Left ventricle: The cavity size was normal. Wall thickness was   increased in a pattern of mild LVH. Systolic function was mildly   to moderately reduced. The estimated ejection fraction was in the   range of 40% to 45%. Diffuse hypokinesis. Doppler parameters are   consistent with abnormal left ventricular relaxation (grade 1   diastolic dysfunction). Abnormal strain pattern, GLS -13.9%. - Aortic valve: There was no stenosis. - Mitral valve: Status post mitral valve repair. No significant   stenosis. There was trivial regurgitation.  Pressure half-time: 80   ms. Mean gradient (D): 5 mm Hg. - Left atrium: The atrium was mildly dilated. - Right ventricle: The cavity size was normal. Systolic function   was normal. - Right atrium: The atrium was mildly dilated. - Tricuspid valve: Peak RV-RA gradient (S): 20 mm Hg. - Pulmonary arteries: PA peak pressure: 23 mm Hg (S). - Inferior vena cava: The vessel was normal in size. The   respirophasic diameter changes were in the normal range (= 50%),   consistent with normal central venous pressure.  Impressions:  - Normal LV size with mild LV hypertrophy. EF 40-45%, diffuse   hypokinesis. Normal RV size and systolic function. Status post   mitral valve repair, trivial MR and no significant stenosis.  Transthoracic echocardiography.  M-mode, complete 2D, spectral Doppler, and color Doppler.  Birthdate:  Patient birthdate: 11-Mar-1955.  Age:  Patient is 61 yr old.  Sex:  Gender: female. BMI: 23.3 kg/m^2.  Blood pressure:     134/80  Patient status: Outpatient.  Study date:  Study date: 08/30/2015. Study time: 01:05 PM.  Location:  [facility]  -------------------------------------------------------------------  ------------------------------------------------------------------- Left ventricle:  The cavity size was normal. Wall thickness was increased in a pattern of mild LVH. Systolic function was mildly to moderately reduced. The estimated ejection fraction was in the range of 40% to 45%. Diffuse hypokinesis. Doppler parameters are consistent with abnormal left ventricular relaxation (grade 1 diastolic dysfunction). Abnormal strain pattern, GLS -13.9%.  ------------------------------------------------------------------- Aortic valve:   Trileaflet.  Doppler:   There was no stenosis. There was no regurgitation.  ------------------------------------------------------------------- Aorta:  Aortic root: The aortic root was normal in size. Ascending aorta: The  ascending aorta was normal in size.  ------------------------------------------------------------------- Mitral valve:  Status post mitral valve repair. No significant stenosis.  Doppler:  There was trivial regurgitation.    Valve area by pressure half-time: 2.75 cm^2. Indexed valve area by pressure half-time: 1.52 cm^2/m^2. Indexed valve area by continuity equation (using LVOT flow): 0.96 cm^2/m^2.    Mean gradient (D): 5 mm Hg. Peak gradient (D): 8 mm Hg.  ------------------------------------------------------------------- Left atrium:  The atrium was mildly dilated.  ------------------------------------------------------------------- Right ventricle:  The cavity size was normal. Systolic function was normal.  ------------------------------------------------------------------- Pulmonic valve:    Structurally normal valve.   Cusp separation was normal.  Doppler:  Transvalvular velocity was within the normal range. There was trivial regurgitation.  ------------------------------------------------------------------- Tricuspid valve:   Doppler:  There was trivial regurgitation.   ------------------------------------------------------------------- Right atrium:  The atrium was mildly dilated.  ------------------------------------------------------------------- Pericardium:  There was no pericardial effusion.  ------------------------------------------------------------------- Systemic veins: Inferior vena cava: The vessel was normal in size. The respirophasic diameter changes were in the normal range (= 50%), consistent with normal central venous pressure. Diameter: 10.7 mm.  ------------------------------------------------------------------- Measurements   IVC  Value          Reference  ID                                        10.7  mm       ---------    Left ventricle                            Value          Reference  LV ID,  ED, PLAX chordal            (L)    42.2  mm       43 - 52  LV ID, ES, PLAX chordal                   31.6  mm       23 - 38  LV fx shortening, PLAX chordal     (L)    25    %        >=29  LV PW thickness, ED                       13.3  mm       ---------  IVS/LV PW ratio, ED                       0.73           <=1.3  Stroke volume, 2D                         89    ml       ---------  Stroke volume/bsa, 2D                     49    ml/m^2   ---------  LV e&', lateral                            6.57  cm/s     ---------  LV E/e&', lateral                          21.46          ---------  LV e&', medial                             3.97  cm/s     ---------  LV E/e&', medial                           35.52          ---------  LV e&', average                            5.27  cm/s     ---------  LV E/e&', average                          26.76          ---------  Longitudinal strain, TDI  14    %        ---------    Ventricular septum                        Value          Reference  IVS thickness, ED                         9.68  mm       ---------    LVOT                                      Value          Reference  LVOT ID, S                                22    mm       ---------  LVOT area                                 3.8   cm^2     ---------  LVOT peak velocity, S                     112   cm/s     ---------  LVOT mean velocity, S                     66.5  cm/s     ---------  LVOT VTI, S                               23.3  cm       ---------  LVOT peak gradient, S                     5     mm Hg    ---------    Aorta                                     Value          Reference  Aortic root ID, ED                        33    mm       ---------  Ascending aorta ID, A-P, S                35    mm       ---------    Left atrium                               Value          Reference  LA ID, A-P, ES                            43    mm       ---------  LA ID/bsa, A-P                      (  H)    2.37  cm/m^2   <=2.2  LA volume, S                              58.2  ml       ---------  LA volume/bsa, S                          32.1  ml/m^2   ---------  LA volume, ES, 1-p A4C                    62    ml       ---------  LA volume/bsa, ES, 1-p A4C                34.2  ml/m^2   ---------  LA volume, ES, 1-p A2C                    51.6  ml       ---------  LA volume/bsa, ES, 1-p A2C                28.5  ml/m^2   ---------    Mitral valve                              Value          Reference  Mitral E-wave peak velocity               141   cm/s     ---------  Mitral A-wave peak velocity               160   cm/s     ---------  Mitral mean velocity, D                   100   cm/s     ---------  Mitral deceleration time           (H)    246   ms       150 - 230  Mitral pressure half-time                 80    ms       ---------  Mitral mean gradient, D                   5     mm Hg    ---------  Mitral peak gradient, D                   8     mm Hg    ---------  Mitral E/A ratio, peak                    0.9            ---------  Mitral valve area, PHT, DP                2.75  cm^2     ---------  Mitral valve area/bsa, PHT, DP            1.52  cm^2/m^2 ---------  Mitral valve area/bsa, LVOT               0.96  cm^2/m^2 ---------  continuity  Mitral annulus VTI, D  50.8  cm       ---------    Pulmonary arteries                        Value          Reference  PA pressure, S, DP                        23    mm Hg    <=30    Tricuspid valve                           Value          Reference  Tricuspid regurg peak velocity            224   cm/s     ---------  Tricuspid peak RV-RA gradient             20    mm Hg    ---------    Right ventricle                           Value          Reference  TAPSE                                     16.2  mm       ---------  RV s&', lateral, S                         9.86  cm/s     ---------  Legend: (L)  and   (H)  mark values outside specified reference range.  ------------------------------------------------------------------- Prepared and Electronically Authenticated by  Loralie Champagne, M.D. 2017-06-22T21:13:05   Impression:  Patient is doing very well approximately one year status post minimally invasive mitral valve repair  Plan:  We have not recommended any changes to the patient's current medications.  The patient has been reminded regarding the importance of dental hygiene and the lifelong need for antibiotic prophylaxis for all dental cleanings and other related invasive procedures.  In the future she will call and return to see [place] only should specific problems or questions arise.  I spent in excess of 15 minutes during the conduct of this office consultation and >50% of this time involved direct face-to-face encounter with the patient for counseling and/or coordination of their care.    Valentina Gu. Roxy Manns, MD 06/30/2016 11:15 AM

## 2016-08-20 ENCOUNTER — Other Ambulatory Visit: Payer: Self-pay | Admitting: Cardiovascular Disease

## 2016-09-21 ENCOUNTER — Other Ambulatory Visit: Payer: Self-pay | Admitting: Cardiovascular Disease

## 2016-09-23 NOTE — Addendum Note (Signed)
Addended by: Derl Barrow on: 09/23/2016 12:07 PM   Modules accepted: Orders

## 2016-10-09 ENCOUNTER — Other Ambulatory Visit: Payer: Self-pay

## 2016-10-09 ENCOUNTER — Encounter: Payer: Self-pay | Admitting: Physician Assistant

## 2016-10-09 ENCOUNTER — Ambulatory Visit (HOSPITAL_COMMUNITY): Payer: BLUE CROSS/BLUE SHIELD | Attending: Cardiovascular Disease

## 2016-10-09 DIAGNOSIS — Z8249 Family history of ischemic heart disease and other diseases of the circulatory system: Secondary | ICD-10-CM | POA: Diagnosis not present

## 2016-10-09 DIAGNOSIS — Z952 Presence of prosthetic heart valve: Secondary | ICD-10-CM

## 2016-10-09 DIAGNOSIS — I059 Rheumatic mitral valve disease, unspecified: Secondary | ICD-10-CM | POA: Diagnosis present

## 2016-10-09 DIAGNOSIS — I420 Dilated cardiomyopathy: Secondary | ICD-10-CM

## 2016-10-09 DIAGNOSIS — I517 Cardiomegaly: Secondary | ICD-10-CM | POA: Diagnosis not present

## 2016-10-09 DIAGNOSIS — Z9889 Other specified postprocedural states: Secondary | ICD-10-CM | POA: Diagnosis not present

## 2016-10-10 ENCOUNTER — Telehealth: Payer: Self-pay | Admitting: *Deleted

## 2016-10-10 NOTE — Telephone Encounter (Signed)
Pt has been notified of Echo results by phone and My Chart as well. Pt thanked me for my call. Results forwarded to PCP. ------

## 2016-10-10 NOTE — Telephone Encounter (Signed)
-----   Message from Liliane Shi, [place] sent at 10/09/2016  9:35 AM EDT ----- Please call the patient. The echocardiogram is stable with improved ejection fraction (now 50-55%).  MV repair looks ok.   Continue current therapy.  Please fax a copy of this study result to her PCP:  Panosh, Standley Brooking, MD  Thanks! Richardson Dopp, PA-C    10/09/2016 9:30 AM

## 2016-11-28 ENCOUNTER — Encounter: Payer: Self-pay | Admitting: Internal Medicine

## 2016-12-01 ENCOUNTER — Encounter: Payer: Self-pay | Admitting: Cardiovascular Disease

## 2016-12-01 ENCOUNTER — Ambulatory Visit (INDEPENDENT_AMBULATORY_CARE_PROVIDER_SITE_OTHER): Payer: BLUE CROSS/BLUE SHIELD | Admitting: Cardiovascular Disease

## 2016-12-01 VITALS — BP 130/74 | HR 70 | Ht 67.5 in | Wt 165.8 lb

## 2016-12-01 DIAGNOSIS — I341 Nonrheumatic mitral (valve) prolapse: Secondary | ICD-10-CM | POA: Diagnosis not present

## 2016-12-01 DIAGNOSIS — I10 Essential (primary) hypertension: Secondary | ICD-10-CM

## 2016-12-01 LAB — BASIC METABOLIC PANEL
BUN/Creatinine Ratio: 13 (ref 9–23)
BUN: 9 mg/dL (ref 6–24)
CO2: 27 mmol/L (ref 20–29)
CREATININE: 0.69 mg/dL (ref 0.57–1.00)
Calcium: 9.6 mg/dL (ref 8.7–10.2)
Chloride: 99 mmol/L (ref 96–106)
GFR calc Af Amer: 110 mL/min/{1.73_m2} (ref >59)
GFR, EST NON AFRICAN AMERICAN: 96 mL/min/{1.73_m2} (ref >59)
Glucose: 89 mg/dL (ref 65–99)
Potassium: 4 mmol/L (ref 3.5–5.2)
Sodium: 140 mmol/L (ref 134–144)

## 2016-12-01 NOTE — Progress Notes (Signed)
Cardiology Office Note   Date:  12/01/2016   ID:  Tracy, Chen [birth year], MRN [id number]  PCP:  Burnis Medin, MD  Cardiologist:   Mertie Moores, MD   Chief Complaint  Patient presents with  . Follow-up    MV repair   Problem List 1. 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 MV repair , June 28, 2015 3. Breast cancer . Left breast, s/p chemo and XRT  History of Present Illness: Tracy Chen is a 61 y.o. female who presents for evaluation of a heart murmur. She was recently found have a heart murmur on exam. She had an echo card gram which revealed mitral valve prolapse involving the posterior leaflet. She has associated severe mitral regurgitation.    no hx of murmur in the past.  No CP or dyspnea.   Exercises regularly without problems.  No limitations  August 13, 2015:  Tracy Chen is seen today for follow up of her MV repair ( June 28, 2015)  Healing up .  Feeling some better Trying to exercise.    HR is fast  Has a "white cloud" over her left eye for a few minutes over [employer] day  No other neurologic deficits.    September 27, 2015:    Doing great.  S/p MV repair. Did not Take her metoprolol this point. Her heart rate is a little elevated as result.  Sept. 24, 2018:  She saw Richardson Dopp in tapering. She continues to do well. Follow-up echocardiogram in August, 2018 revealed normal left ventricle systolic function.  Past Medical History:  Diagnosis Date  . Allergic rhinitis   . Breast cancer ([place])   . Gallstone   . History of chickenpox    as a child  . 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 HX: breast cancer    hx of 2002 left er/pr 3.2cm  . Osteoporosis    L5  . S/P minimally invasive mitral valve repair 06/28/2015   Complex valvuloplasty including triangular resection of flail segment of posterior leaflet, artificial Gore-tex  neochord placement x4 and 32 mm Sorin Memo 3D ring annuloplasty via right mini-thoracotomy incision    Past Surgical History:  Procedure Laterality Date  . abd [place]     gallstones CT gallstones 1/06  . CARDIAC CATHETERIZATION N/A 05/29/2015   Procedure: Right/Left Heart Cath and Coronary Angiography;  Surgeon: Peter M Martinique, MD;  Location: [place] [facility];  Service: Cardiovascular;  Laterality: N/A;  . COLONOSCOPY    . MITRAL VALVE REPAIR Right 06/28/2015   Procedure: MINIMALLY INVASIVE MITRAL VALVE REPAIR (MVR);  Surgeon: Rexene Alberts, MD;  Location: [place];  Service: Open Heart Surgery;  Laterality: Right;  32 Sorin Memo 3D Ring  . PARATHYROIDECTOMY  11/06   left inferior  . partial left mastectomy     . TEE WITHOUT CARDIOVERSION N/A 04/25/2015   Procedure: TRANSESOPHAGEAL ECHOCARDIOGRAM (TEE);  Surgeon: Thayer Headings, MD;  Location: [facility];  Service: Cardiovascular;  Laterality: N/A;  . TEE WITHOUT CARDIOVERSION N/A 06/28/2015   Procedure: TRANSESOPHAGEAL ECHOCARDIOGRAM (TEE);  Surgeon: Rexene Alberts, MD;  Location: [facility];  Service: Open Heart Surgery;  Laterality: N/A;     Current Outpatient Prescriptions  Medication Sig Dispense Refill  . aspirin EC 81 MG tablet Take 1 tablet (81 mg  total) by mouth daily.    . clindamycin (CLEOCIN) 150 MG capsule Take 1 capsule (150 mg total) by mouth as directed. Take 4 tabs 1 hour prior to dental work 4 capsule 6  . hydrochlorothiazide (HYDRODIURIL) 25 MG tablet TAKE ONE TABLET BY MOUTH ONCE DAILY 90 tablet 2  . KLOR-CON M20 20 MEQ tablet TAKE 1 TABLET BY MOUTH ONCE DAILY 90 tablet 1  . metoprolol tartrate (LOPRESSOR) 25 MG tablet TAKE ONE TABLET BY MOUTH TWICE DAILY 180 tablet 1   No current facility-administered medications for this visit.     Allergies:   Penicillins    Social History:  The patient  reports that she has never smoked. She has never used smokeless tobacco. She reports that she does not drink alcohol or use drugs.    Family History:  The patient's family history includes Colon cancer in her maternal aunt and mother; Heart murmur in her brother; Osteoporosis in her maternal aunt; Thyroid disease in her unknown relative; Valvular heart disease in her father.    ROS:  Please see the history of present illness.    Review of Systems: Constitutional:  denies fever, chills, diaphoresis, appetite change and fatigue.  HEENT: denies photophobia, eye pain, redness, hearing loss, ear pain, congestion, sore throat, rhinorrhea, sneezing, neck pain, neck stiffness and tinnitus.  Respiratory: denies SOB, DOE, cough, chest tightness, and wheezing.  Cardiovascular: denies chest pain, palpitations and leg swelling.  Gastrointestinal: denies nausea, vomiting, abdominal pain, diarrhea, constipation, blood in stool.  Genitourinary: denies dysuria, urgency, frequency, hematuria, flank pain and difficulty urinating.  Musculoskeletal: denies  myalgias, back pain, joint swelling, arthralgias and gait problem.   Skin: denies pallor, rash and wound.  Neurological: denies dizziness, seizures, syncope, weakness, light-headedness, numbness and headaches.   Hematological: denies adenopathy, easy bruising, personal or family bleeding history.  Psychiatric/ Behavioral: denies suicidal ideation, mood changes, confusion, nervousness, sleep disturbance and agitation.       All other systems are reviewed and negative.    PHYSICAL EXAM: VS:  BP 130/74   Pulse 70   Ht 5' 7.5" (1.715 m)   Wt 165 lb 12.8 oz (75.2 kg)   SpO2 96%   BMI 25.58 kg/m  , BMI Body mass index is 25.58 kg/m. GEN: Well nourished, well developed, in no acute distress  HEENT: normal  Neck: no JVD, carotid bruits, or masses Cardiac: RRR; tachycardic,  no rubs, or gallops,no edema  Respiratory:  clear to auscultation bilaterally, normal work of breathing GI: soft, nontender, nondistended, + BS MS: no deformity or atrophy  Skin: warm and dry, no rash Neuro:   Strength and sensation are intact Psych: normal   EKG:  EKG is not ordered today.    Recent Labs: 02/04/2016: ALT 18; BUN 10; Creatinine, Ser 0.65; Hemoglobin 12.9; Platelets 345.0; Potassium 3.6; Sodium 140; TSH 1.13    Lipid Panel    Component Value Date/Time   CHOL 160 02/04/2016 1056   TRIG 143.0 02/04/2016 1056   TRIG 62 12/26/2005 1000   HDL 45.70 02/04/2016 1056   CHOLHDL 3 02/04/2016 1056   VLDL 28.6 02/04/2016 1056   LDLCALC 85 02/04/2016 1056      Wt Readings from Last 3 Encounters:  12/01/16 165 lb 12.8 oz (75.2 kg)  06/30/16 156 lb (70.8 kg)  04/11/16 166 lb 1.9 oz (75.4 kg)      Other studies Reviewed: Additional studies/ records that were reviewed today include: . Review of the above records demonstrates:  ASSESSMENT AND PLAN:  1.  MVP, / MR Will give script for clindamycin 150 mg tabs, to take 4 tabs one hour prior to dental work  Seems to be doing well   2. Tachycardia - better on metoprolol  3.  HTN:   BP is normal on HCTZ and Kdur   I will see her in 6 wmonths  for follow up visit   Current medicines are reviewed at length with the patient today.  The patient does not have concerns regarding medicines.  The following changes have been made:  no change  Labs/ tests ordered today include:   No orders of the defined types were placed in this encounter.    Mertie Moores, MD  12/01/2016 11:47 AM    [address], [place], [place]  [address] Phone: [phone]; Fax: [phone]

## 2016-12-01 NOTE — Patient Instructions (Signed)
Medication Instructions:  Your physician recommends that you continue on your current medications as directed. Please refer to the Current Medication list given to you today.   Labwork: TODAY - basic metabolic panel   Testing/Procedures: None Ordered   Follow-Up: Your physician wants you to follow-up in: 1 year with Dr. Nahser. You will receive a reminder letter in the mail two months in advance. If you don't receive a letter, please call our office to schedule the follow-up appointment.   If you need a refill on your cardiac medications before your next appointment, please call your pharmacy.   Thank you for choosing [facility]! Michelle Swinyer, RN [phone]    

## 2017-01-07 ENCOUNTER — Other Ambulatory Visit: Payer: Self-pay | Admitting: Cardiovascular Disease

## 2017-02-20 ENCOUNTER — Other Ambulatory Visit: Payer: Self-pay | Admitting: Cardiovascular Disease

## 2017-03-12 ENCOUNTER — Encounter: Payer: BLUE CROSS/BLUE SHIELD | Admitting: Internal Medicine

## 2017-03-18 ENCOUNTER — Encounter: Payer: BLUE CROSS/BLUE SHIELD | Admitting: Internal Medicine

## 2017-03-25 ENCOUNTER — Other Ambulatory Visit: Payer: Self-pay | Admitting: Cardiovascular Disease

## 2017-04-28 NOTE — Progress Notes (Signed)
Chief Complaint  Patient presents with  . Annual Exam    No new concerns    HPI: Patient  Tracy Chen  62 y.o. comes in today for Preventive Health Care visit   CV  S/p mv repair  Sees cards yearly no concerns no limitations.  Tachy better on b blocker  No sx no ht  Breast cancer due for mammo no sx . Take ca vit d every other day or so some exercise    Health Maintenance  Topic Date Due  . HIV Screening  04/23/1970  . INFLUENZA VACCINE  12/30/2017 (Originally 10/08/2016)  . MAMMOGRAM  03/14/2018  . PAP SMEAR  02/04/2019  . COLONOSCOPY  05/22/2024  . TETANUS/TDAP  02/03/2026  . Hepatitis C Screening  Completed   Health Maintenance Review LIFESTYLE:  Exercise:   3-4 x per week 20 min no physical limitations  Tobacco/ETS:n Alcohol: n Sugar beverages: Sleep: 7y hours  Drug use: no HH of 2 no pets  Work: 40 - 42 hours per week     ROS:  GEN/ HEENT: No fever, significant weight changes sweats headaches vision problems hearing changes, CV/ PULM; No chest pain shortness of breath cough, syncope,edema  change in exercise tolerance. GI /GU: No adominal pain, vomiting, change in bowel habits. No blood in the stool. No significant GU symptoms. SKIN/HEME: ,no acute skin rashes suspicious lesions or bleeding. No lymphadenopathy, nodules, masses.  NEURO/ PSYCH:  No neurologic signs such as weakness numbness. No depression anxiety. IMM/ Allergy: No unusual infections.  Allergy .   REST of 12 system review negative except as per HPI   Past Medical History:  Diagnosis Date  . Allergic rhinitis   . Breast cancer ([place])   . Gallstone   . History of chickenpox    as a child  . 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 HX: breast cancer    hx of 2002 left er/pr 3.2cm  . Osteoporosis    L5  . 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 abd [place]     gallstones CT gallstones 1/06  . CARDIAC CATHETERIZATION N/A 05/29/2015   Procedure: Right/Left Heart Cath and Coronary Angiography;  Surgeon: Peter M Martinique, MD;  Location: [facility];  Service: Cardiovascular;  Laterality: N/A;  . COLONOSCOPY    . MITRAL VALVE REPAIR Right 06/28/2015   Procedure: MINIMALLY INVASIVE MITRAL VALVE REPAIR (MVR);  Surgeon: Rexene Alberts, MD;  Location: [facility];  Service: Open Heart Surgery;  Laterality: Right;  32 Sorin Memo 3D Ring  . PARATHYROIDECTOMY  11/06   left inferior  . partial left mastectomy     . TEE WITHOUT CARDIOVERSION N/A 04/25/2015   Procedure: TRANSESOPHAGEAL ECHOCARDIOGRAM (TEE);  Surgeon: Thayer Headings, MD;  Location: [facility];  Service: Cardiovascular;  Laterality: N/A;  . TEE WITHOUT CARDIOVERSION N/A 06/28/2015   Procedure: TRANSESOPHAGEAL ECHOCARDIOGRAM (TEE);  Surgeon: Rexene Alberts, MD;  Location: [facility];  Service: Open Heart Surgery;  Laterality: N/A;    Family History  Problem Relation Age of Onset  .  Osteoporosis Maternal Aunt   . Colon cancer Maternal Aunt   . Thyroid disease Unknown   . Heart murmur Brother        Picked up on exam followed by echo.  . Colon cancer Mother   . Valvular heart disease Father        followed     Social History   Socioeconomic History  . Marital status: Married    Spouse name: None  . Number of children: None  . Years of education: None  . Highest education level: None  Social Needs  . Financial resource strain: None  . Food insecurity - worry: None  . Food insecurity - inability: None  . Transportation needs - medical: None  . Transportation needs - non-medical: None  Occupational History  .  None  Tobacco Use  . Smoking status: Never Smoker  . Smokeless tobacco: Never Used  Substance and Sexual Activity  . Alcohol use: No    Alcohol/week: 0.0 oz  . Drug use: No  . Sexual activity: None  Other Topics Concern  . None  Social History Narrative   Regular exercise- some   HH of 3     Pets outside goats and chicken.   1 cat    No ets.   Now working in [employer] office . 40 hours  Stopped second job At [employer]   26+   Neg ets   G2P2   6.5-7 hours    Neg ta caffiene .     Outpatient Medications Prior to Visit  Medication Sig Dispense Refill  . aspirin EC 81 MG tablet Take 1 tablet (81 mg total) by mouth daily.    . clindamycin (CLEOCIN) 150 MG capsule TAKE FOUR CAPSULES BY MOUTH ONE HOUR PRIOR TO DENTAL WORK 4 capsule 6  . hydrochlorothiazide (HYDRODIURIL) 25 MG tablet TAKE 1 TABLET BY MOUTH ONCE DAILY 90 tablet 2  . KLOR-CON M20 20 MEQ tablet TAKE 1 TABLET BY MOUTH ONCE DAILY 90 tablet 2  . metoprolol tartrate (LOPRESSOR) 25 MG tablet TAKE 1 TABLET BY MOUTH TWICE DAILY 180 tablet 1   No facility-administered medications prior to visit.      EXAM:  BP 118/82 (BP Location: Right Arm, Patient Position: Sitting, Cuff Size: Normal)   Pulse 69   Temp 97.7 F (36.5 C) (Oral)   Ht 5' 6" (1.676 m)   Wt 162 lb 1.6 oz (73.5 kg)   BMI 26.16 kg/m   Body mass index is 26.16 kg/m. Wt Readings from Last 3 Encounters:  04/29/17 162 lb 1.6 oz (73.5 kg)  12/01/16 165 lb 12.8 oz (75.2 kg)  06/30/16 156 lb (70.8 kg)    Physical Exam: Vital signs reviewed YSA:YTKZ is a well-developed well-nourished alert cooperative    who appearsr stated age in no acute distress.  HEENT: normocephalic atraumatic , Eyes: PERRL EOM's full, conjunctiva clear, Nares: paten,t no deformity discharge or tenderness., Ears: no deformity EAC's clear TMs with normal landmarks. Mouth: clear OP, no lesions, edema.  Moist mucous membranes. Dentition in adequate repair. NECK: supple without masses,  thyromegaly or bruits. CHEST/PULM:  Clear to auscultation and percussion breath sounds equal no wheeze , rales or rhonchi. No chest wall deformities or tenderness. Breast: normal by inspection . No dimpling, discharge, masses, tenderness or discharge . Left  Sp surgery reconst  rihgt no masses axilla clear  CV: PMI is nondisplaced, S1 S2 no gallops, murmurs, rubs. Peripheral pulses are  without delay.No JVD .  ABDOMEN:  Bowel sounds normal nontender  No guard or rebound, no hepato splenomegal no CVA tenderness.   Extremtities:  No clubbing cyanosis or edema, no acute joint swelling or redness no focal atrophy NEURO:  Oriented x3, cranial nerves 3-12 appear to be intact, no obvious focal weakness,gait within normal limits no abnormal reflexes or asymmetrical SKIN: No acute rashes normal turgor, color, no bruising or petechiae. PSYCH: Oriented, good eye contact, no obvious depression anxiety, cognition and judgment appear normal. LN: no cervical axillary inguinal adenopathy  Lab Results  Component Value Date   WBC 6.8 02/04/2016   HGB 12.9 02/04/2016   HCT 38.4 02/04/2016   PLT 345.0 02/04/2016   GLUCOSE 89 12/01/2016   CHOL 160 02/04/2016   TRIG 143.0 02/04/2016   HDL 45.70 02/04/2016   LDLCALC 85 02/04/2016   ALT 18 02/04/2016   AST 16 02/04/2016   NA 140 12/01/2016   K 4.0 12/01/2016   CL 99 12/01/2016   CREATININE 0.69 12/01/2016   BUN 9 12/01/2016   CO2 27 12/01/2016   TSH 1.13 02/04/2016   INR 1.9 09/20/2015   HGBA1C 5.5 06/26/2015    BP Readings from Last 3 Encounters:  04/29/17 118/82  12/01/16 130/74  06/30/16 118/75      ASSESSMENT AND PLAN:  Discussed the following assessment and plan:  Visit for preventive health examination - Plan: CBC with Differential/Platelet, Lipid panel, TSH  HX: breast cancer - Plan: CBC with Differential/Platelet, Lipid panel, TSH, DG Bone Density  Osteopenia, unspecified location - Plan: CBC with Differential/Platelet, Lipid panel,  TSH, DG Bone Density  Estrogen deficiency - Plan: CBC with Differential/Platelet, Lipid panel, TSH, DG Bone Density  S/P minimally invasive mitral valve repair  Influenza vaccination declined by patient  Patient Care Team: Burnis Medin, MD as PCP - General Gatha Mayer, MD (Gastroenterology) Patient Instructions  glad you are doing well.   Attention to bone health  Will notify you  of labs when available.   I advise an updated  Bone density .      Health Maintenance, Female Adopting a healthy lifestyle and getting preventive care can go a long way to promote health and wellness. Talk with your health care provider about what schedule of regular examinations is right for you. This is a good chance for you to check in with your provider about disease prevention and staying healthy. In between checkups, there are plenty of things you can do on your own. Experts have done a lot of research about which lifestyle changes and preventive measures are most likely to keep you healthy. Ask your health care provider for more information. Weight and diet Eat a healthy diet  Be sure to include plenty of vegetables, fruits, low-fat dairy products, and lean protein.  Do not eat a lot of foods high in solid fats, added sugars, or salt.  Get regular exercise. This is one of the most important things you can do for your health. ? Most adults should exercise for at least 150 minutes each week. The exercise should increase your heart rate and make you sweat (moderate-intensity exercise). ? Most adults should also do strengthening exercises at least twice a week. This is in addition to the moderate-intensity exercise.  Maintain a healthy weight  Body mass index (BMI) is a measurement that can be used to identify possible weight problems. It estimates body fat based on height and weight. Your health care provider can help determine your BMI and help you achieve or  maintain a healthy  weight.  For females 19 years of age and older: ? A BMI below 18.5 is considered underweight. ? A BMI of 18.5 to 24.9 is normal. ? A BMI of 25 to 29.9 is considered overweight. ? A BMI of 30 and above is considered obese.  Watch levels of cholesterol and blood lipids  You should start having your blood tested for lipids and cholesterol at 60 years of age, then have this test every 5 years.  You may need to have your cholesterol levels checked more often if: ? Your lipid or cholesterol levels are high. ? You are older than 60 years of age. ? You are at high risk for heart disease.  Cancer screening Lung Cancer  Lung cancer screening is recommended for adults 68-65 years old who are at high risk for lung cancer because of a history of smoking.  A yearly low-dose CT scan of the lungs is recommended for people who: ? Currently smoke. ? Have quit within the past 15 years. ? Have at least a 30-pack-year history of smoking. A pack year is smoking an average of one pack of cigarettes a day for 1 year.  Yearly screening should continue until it has been 15 years since you quit.  Yearly screening should stop if you develop a health problem that would prevent you from having lung cancer treatment.  Breast Cancer  Practice breast self-awareness. This means understanding how your breasts normally appear and feel.  It also means doing regular breast self-exams. Let your health care provider know about any changes, no matter how small.  If you are in your 20s or 30s, you should have a clinical breast exam (CBE) by a health care provider every 1-3 years as part of a regular health exam.  If you are 65 or older, have a CBE every year. Also consider having a breast X-ray (mammogram) every year.  If you have a family history of breast cancer, talk to your health care provider about genetic screening.  If you are at high risk for breast cancer, talk to your health care provider about having an  MRI and a mammogram every year.  Breast cancer gene (BRCA) assessment is recommended for women who have family members with BRCA-related cancers. BRCA-related cancers include: ? Breast. ? Ovarian. ? Tubal. ? Peritoneal cancers.  Results of the assessment will determine the need for genetic counseling and BRCA1 and BRCA2 testing.  Cervical Cancer Your health care provider may recommend that you be screened regularly for cancer of the pelvic organs (ovaries, uterus, and vagina). This screening involves a pelvic examination, including checking for microscopic changes to the surface of your cervix (Pap test). You may be encouraged to have this screening done every 3 years, beginning at age 35.  For women ages 75-65, health care providers may recommend pelvic exams and Pap testing every 3 years, or they may recommend the Pap and pelvic exam, combined with testing for human papilloma virus (HPV), every 5 years. Some types of HPV increase your risk of cervical cancer. Testing for HPV may also be done on women of any age with unclear Pap test results.  Other health care providers may not recommend any screening for nonpregnant women who are considered low risk for pelvic cancer and who do not have symptoms. Ask your health care provider if a screening pelvic exam is right for you.  If you have had past treatment for cervical cancer or a condition that could lead  to cancer, you need Pap tests and screening for cancer for at least 20 years after your treatment. If Pap tests have been discontinued, your risk factors (such as having a new sexual partner) need to be reassessed to determine if screening should resume. Some women have medical problems that increase the chance of getting cervical cancer. In these cases, your health care provider may recommend more frequent screening and Pap tests.  Colorectal Cancer  This type of cancer can be detected and often prevented.  Routine colorectal cancer screening  usually begins at 60 years of age and continues through 60 years of age.  Your health care provider may recommend screening at an earlier age if you have risk factors for colon cancer.  Your health care provider may also recommend using home test kits to check for hidden blood in the stool.  A small camera at the end of a tube can be used to examine your colon directly (sigmoidoscopy or colonoscopy). This is done to check for the earliest forms of colorectal cancer.  Routine screening usually begins at age 32.  Direct examination of the colon should be repeated every 5-10 years through 60 years of age. However, you may need to be screened more often if early forms of precancerous polyps or small growths are found.  Skin Cancer  Check your skin from head to toe regularly.  Tell your health care provider about any new moles or changes in moles, especially if there is a change in a mole's shape or color.  Also tell your health care provider if you have a mole that is larger than the size of a pencil eraser.  Always use sunscreen. Apply sunscreen liberally and repeatedly throughout the day.  Protect yourself by wearing long sleeves, pants, a wide-brimmed hat, and sunglasses whenever you are outside.  Heart disease, diabetes, and high blood pressure  High blood pressure causes heart disease and increases the risk of stroke. High blood pressure is more likely to develop in: ? People who have blood pressure in the high end of the normal range (130-139/85-89 mm Hg). ? People who are overweight or obese. ? People who are African American.  If you are 56-67 years of age, have your blood pressure checked every 3-5 years. If you are 17 years of age or older, have your blood pressure checked every year. You should have your blood pressure measured twice-once when you are at a hospital or clinic, and once when you are not at a hospital or clinic. Record the average of the two measurements. To check  your blood pressure when you are not at a hospital or clinic, you can use: ? An automated blood pressure machine at a pharmacy. ? A home blood pressure monitor.  If you are between 23 years and 31 years old, ask your health care provider if you should take aspirin to prevent strokes.  Have regular diabetes screenings. This involves taking a blood sample to check your fasting blood sugar level. ? If you are at a normal weight and have a low risk for diabetes, have this test once every three years after 60 years of age. ? If you are overweight and have a high risk for diabetes, consider being tested at a younger age or more often. Preventing infection Hepatitis B  If you have a higher risk for hepatitis B, you should be screened for this virus. You are considered at high risk for hepatitis B if: ? You were born in a  country where hepatitis B is common. Ask your health care provider which countries are considered high risk. ? Your parents were born in a high-risk country, and you have not been immunized against hepatitis B (hepatitis B vaccine). ? You have HIV or AIDS. ? You use needles to inject street drugs. ? You live with someone who has hepatitis B. ? You have had sex with someone who has hepatitis B. ? You get hemodialysis treatment. ? You take certain medicines for conditions, including cancer, organ transplantation, and autoimmune conditions.  Hepatitis C  Blood testing is recommended for: ? Everyone born from [birth year] through 1963. ? Anyone with known risk factors for hepatitis C.  Sexually transmitted infections (STIs)  You should be screened for sexually transmitted infections (STIs) including gonorrhea and chlamydia if: ? You are sexually active and are younger than 60 years of age. ? You are older than 60 years of age and your health care provider tells you that you are at risk for this type of infection. ? Your sexual activity has changed since you were last screened and you  are at an increased risk for chlamydia or gonorrhea. Ask your health care provider if you are at risk.  If you do not have HIV, but are at risk, it may be recommended that you take a prescription medicine daily to prevent HIV infection. This is called pre-exposure prophylaxis (PrEP). You are considered at risk if: ? You are sexually active and do not regularly use condoms or know the HIV status of your partner(s). ? You take drugs by injection. ? You are sexually active with a partner who has HIV.  Talk with your health care provider about whether you are at high risk of being infected with HIV. If you choose to begin PrEP, you should first be tested for HIV. You should then be tested every 3 months for as long as you are taking PrEP. Pregnancy  If you are premenopausal and you may become pregnant, ask your health care provider about preconception counseling.  If you may become pregnant, take 400 to 800 micrograms (mcg) of folic acid every day.  If you want to prevent pregnancy, talk to your health care provider about birth control (contraception). Osteoporosis and menopause  Osteoporosis is a disease in which the bones lose minerals and strength with aging. This can result in serious bone fractures. Your risk for osteoporosis can be identified using a bone density scan.  If you are 80 years of age or older, or if you are at risk for osteoporosis and fractures, ask your health care provider if you should be screened.  Ask your health care provider whether you should take a calcium or vitamin D supplement to lower your risk for osteoporosis.  Menopause may have certain physical symptoms and risks.  Hormone replacement therapy may reduce some of these symptoms and risks. Talk to your health care provider about whether hormone replacement therapy is right for you. Follow these instructions at home:  Schedule regular health, dental, and eye exams.  Stay current with your  immunizations.  Do not use any tobacco products including cigarettes, chewing tobacco, or electronic cigarettes.  If you are pregnant, do not drink alcohol.  If you are breastfeeding, limit how much and how often you drink alcohol.  Limit alcohol intake to no more than 1 drink per day for nonpregnant women. One drink equals 12 ounces of beer, 5 ounces of wine, or 1 ounces of hard liquor.  Do not  use street drugs.  Do not share needles.  Ask your health care provider for help if you need support or information about quitting drugs.  Tell your health care provider if you often feel depressed.  Tell your health care provider if you have ever been abused or do not feel safe at home. This information is not intended to replace advice given to you by your health care provider. Make sure you discuss any questions you have with your health care provider. Document Released: 09/09/2010 Document Revised: 08/02/2015 Document Reviewed: 11/28/2014 Elsevier Interactive Patient Education  2018 [place] protect organs, store calcium, and anchor muscles. Good health habits, such as eating nutritious foods and exercising regularly, are important for maintaining healthy bones. They can also help to prevent a condition that causes bones to lose density and become weak and brittle (osteoporosis). Why is bone mass important? Bone mass refers to the amount of bone tissue that you have. The higher your bone mass, the stronger your bones. An important step toward having healthy bones throughout life is to have strong and dense bones during childhood. A young adult who has a high bone mass is more likely to have a high bone mass later in life. Bone mass at its greatest it is called peak bone mass. A large decline in bone mass occurs in older adults. In women, it occurs about the time of menopause. During this time, it is important to practice good health habits, because if more bone is  lost than what is replaced, the bones will become less healthy and more likely to break (fracture). If you find that you have a low bone mass, you may be able to prevent osteoporosis or further bone loss by changing your diet and lifestyle. How can I find out if my bone mass is low? Bone mass can be measured with an X-ray test that is called a bone mineral density (BMD) test. This test is recommended for all women who are age 74 or older. It may also be recommended for men who are age 63 or older, or for people who are more likely to develop osteoporosis due to:  Having bones that break easily.  Having a long-term disease that weakens bones, such as kidney disease or rheumatoid arthritis.  Having menopause earlier than normal.  Taking medicine that weakens bones, such as steroids, thyroid hormones, or hormone treatment for breast cancer or prostate cancer.  Smoking.  Drinking three or more alcoholic drinks each day.  What are the nutritional recommendations for healthy bones? To have healthy bones, you need to get enough of the right minerals and vitamins. Most nutrition experts recommend getting these nutrients from the foods that you eat. Nutritional recommendations vary from person to person. Ask your health care provider what is healthy for you. Here are some general guidelines. Calcium Recommendations Calcium is the most important (essential) mineral for bone health. Most people can get enough calcium from their diet, but supplements may be recommended for people who are at risk for osteoporosis. Good sources of calcium include:  Dairy products, such as low-fat or nonfat milk, cheese, and yogurt.  Dark green leafy vegetables, such as bok choy and broccoli.  Calcium-fortified foods, such as orange juice, cereal, bread, soy beverages, and tofu products.  Nuts, such as almonds.  Follow these recommended amounts for daily calcium intake:  Children, age 74?3: 700 mg.  Children, age  64?8: 1,000 mg.  Children, age 59?13: 1,300 mg.  Teens, age 60?18: 1,300 mg.  Adults, age 89?50: 1,000 mg.  Adults, age 45?70: ? Men: 1,000 mg. ? Women: 1,200 mg.  Adults, age 1 or older: 1,200 mg.  Pregnant and breastfeeding females: ? Teens: 1,300 mg. ? Adults: 1,000 mg.  Vitamin D Recommendations Vitamin D is the most essential vitamin for bone health. It helps the body to absorb calcium. Sunlight stimulates the skin to make vitamin D, so be sure to get enough sunlight. If you live in a cold climate or you do not get outside often, your health care provider may recommend that you take vitamin D supplements. Good sources of vitamin D in your diet include:  Egg yolks.  Saltwater fish.  Milk and cereal fortified with vitamin D.  Follow these recommended amounts for daily vitamin D intake:  Children and teens, age 80?18: 89 international units.  Adults, age 22 or younger: 400-800 international units.  Adults, age 37 or older: 800-1,000 international units.  Other Nutrients Other nutrients for bone health include:  Phosphorus. This mineral is found in meat, poultry, dairy foods, nuts, and legumes. The recommended daily intake for adult men and adult women is 700 mg.  Magnesium. This mineral is found in seeds, nuts, dark green vegetables, and legumes. The recommended daily intake for adult men is 400?420 mg. For adult women, it is 310?320 mg.  Vitamin K. This vitamin is found in green leafy vegetables. The recommended daily intake is 120 mg for adult men and 90 mg for adult women.  What type of physical activity is best for building and maintaining healthy bones? Weight-bearing and strength-building activities are important for building and maintaining peak bone mass. Weight-bearing activities cause muscles and bones to work against gravity. Strength-building activities increases muscle strength that supports bones. Weight-bearing and muscle-building activities  include:  Walking and hiking.  Jogging and running.  Dancing.  Gym exercises.  Lifting weights.  Tennis and racquetball.  Climbing stairs.  Aerobics.  Adults should get at least 30 minutes of moderate physical activity on most days. Children should get at least 60 minutes of moderate physical activity on most days. Ask your health care provide what type of exercise is best for you. Where can I find more information? For more information, check out the following websites:  [place]: [employer]se  [employer]: http://www.niams.AnonymousEar.fr.asp  This information is not intended to replace advice given to you by your health care provider. Make sure you discuss any questions you have with your health care provider. Document Released: 05/17/2003 Document Revised: 09/14/2015 Document Reviewed: 03/01/2014 Elsevier Interactive Patient Education  2018 [place]. Panosh M.D.

## 2017-04-29 ENCOUNTER — Encounter: Payer: Self-pay | Admitting: Internal Medicine

## 2017-04-29 ENCOUNTER — Ambulatory Visit (INDEPENDENT_AMBULATORY_CARE_PROVIDER_SITE_OTHER): Payer: BLUE CROSS/BLUE SHIELD | Admitting: Internal Medicine

## 2017-04-29 VITALS — BP 118/82 | HR 69 | Temp 97.7°F | Ht 66.0 in | Wt 162.1 lb

## 2017-04-29 DIAGNOSIS — Z2821 Immunization not carried out because of patient refusal: Secondary | ICD-10-CM

## 2017-04-29 DIAGNOSIS — M8580 Other specified disorders of bone density and structure, unspecified site: Secondary | ICD-10-CM | POA: Diagnosis not present

## 2017-04-29 DIAGNOSIS — Z853 Personal history of malignant neoplasm of breast: Secondary | ICD-10-CM

## 2017-04-29 DIAGNOSIS — Z0000 Encounter for general adult medical examination without abnormal findings: Secondary | ICD-10-CM | POA: Diagnosis not present

## 2017-04-29 DIAGNOSIS — Z9889 Other specified postprocedural states: Secondary | ICD-10-CM

## 2017-04-29 DIAGNOSIS — E2839 Other primary ovarian failure: Secondary | ICD-10-CM | POA: Diagnosis not present

## 2017-04-29 LAB — CBC WITH DIFFERENTIAL/PLATELET
Basophils Absolute: 0 10*3/uL (ref 0.0–0.1)
Basophils Relative: 0.4 % (ref 0.0–3.0)
EOS ABS: 0.1 10*3/uL (ref 0.0–0.7)
Eosinophils Relative: 1.9 % (ref 0.0–5.0)
HEMATOCRIT: 38.8 % (ref 36.0–46.0)
Hemoglobin: 13.1 g/dL (ref 12.0–15.0)
LYMPHS PCT: 28 % (ref 12.0–46.0)
Lymphs Abs: 1.7 10*3/uL (ref 0.7–4.0)
MCHC: 33.7 g/dL (ref 30.0–36.0)
MCV: 86.1 fl (ref 78.0–100.0)
MONOS PCT: 7.1 % (ref 3.0–12.0)
Monocytes Absolute: 0.4 10*3/uL (ref 0.1–1.0)
NEUTROS ABS: 3.8 10*3/uL (ref 1.4–7.7)
Neutrophils Relative %: 62.6 % (ref 43.0–77.0)
PLATELETS: 371 10*3/uL (ref 150.0–400.0)
RBC: 4.51 Mil/uL (ref 3.87–5.11)
RDW: 13.1 % (ref 11.5–15.5)
WBC: 6.1 10*3/uL (ref 4.0–10.5)

## 2017-04-29 LAB — LIPID PANEL
CHOL/HDL RATIO: 4
Cholesterol: 132 mg/dL (ref 0–200)
HDL: 34.3 mg/dL — ABNORMAL LOW (ref >39.00)
LDL Cholesterol: 74 mg/dL (ref 0–99)
NONHDL: 97.67
Triglycerides: 117 mg/dL (ref 0.0–149.0)
VLDL: 23.4 mg/dL (ref 0.0–40.0)

## 2017-04-29 LAB — TSH: TSH: 0.8 u[IU]/mL (ref 0.35–4.50)

## 2017-04-29 NOTE — Patient Instructions (Addendum)
glad you are doing well.   Attention to bone health  Will notify you  of labs when available.   I advise an updated  Bone density .      Health Maintenance, Female Adopting a healthy lifestyle and getting preventive care can go a long way to promote health and wellness. Talk with your health care provider about what schedule of regular examinations is right for you. This is a good chance for you to check in with your provider about disease prevention and staying healthy. In between checkups, there are plenty of things you can do on your own. Experts have done a lot of research about which lifestyle changes and preventive measures are most likely to keep you healthy. Ask your health care provider for more information. Weight and diet Eat a healthy diet  Be sure to include plenty of vegetables, fruits, low-fat dairy products, and lean protein.  Do not eat a lot of foods high in solid fats, added sugars, or salt.  Get regular exercise. This is one of the most important things you can do for your health. ? Most adults should exercise for at least 150 minutes each week. The exercise should increase your heart rate and make you sweat (moderate-intensity exercise). ? Most adults should also do strengthening exercises at least twice a week. This is in addition to the moderate-intensity exercise.  Maintain a healthy weight  Body mass index (BMI) is a measurement that can be used to identify possible weight problems. It estimates body fat based on height and weight. Your health care provider can help determine your BMI and help you achieve or maintain a healthy weight.  For females 26 years of age and older: ? A BMI below 18.5 is considered underweight. ? A BMI of 18.5 to 24.9 is normal. ? A BMI of 25 to 29.9 is considered overweight. ? A BMI of 30 and above is considered obese.  Watch levels of cholesterol and blood lipids  You should start having your blood tested for lipids and  cholesterol at 60 years of age, then have this test every 5 years.  You may need to have your cholesterol levels checked more often if: ? Your lipid or cholesterol levels are high. ? You are older than 60 years of age. ? You are at high risk for heart disease.  Cancer screening Lung Cancer  Lung cancer screening is recommended for adults 64-70 years old who are at high risk for lung cancer because of a history of smoking.  A yearly low-dose CT scan of the lungs is recommended for people who: ? Currently smoke. ? Have quit within the past 15 years. ? Have at least a 30-pack-year history of smoking. A pack year is smoking an average of one pack of cigarettes a day for 1 year.  Yearly screening should continue until it has been 15 years since you quit.  Yearly screening should stop if you develop a health problem that would prevent you from having lung cancer treatment.  Breast Cancer  Practice breast self-awareness. This means understanding how your breasts normally appear and feel.  It also means doing regular breast self-exams. Let your health care provider know about any changes, no matter how small.  If you are in your 20s or 30s, you should have a clinical breast exam (CBE) by a health care provider every 1-3 years as part of a regular health exam.  If you are 18 or older, have a CBE every year.  Also consider having a breast X-ray (mammogram) every year.  If you have a family history of breast cancer, talk to your health care provider about genetic screening.  If you are at high risk for breast cancer, talk to your health care provider about having an MRI and a mammogram every year.  Breast cancer gene (BRCA) assessment is recommended for women who have family members with BRCA-related cancers. BRCA-related cancers include: ? Breast. ? Ovarian. ? Tubal. ? Peritoneal cancers.  Results of the assessment will determine the need for genetic counseling and BRCA1 and BRCA2  testing.  Cervical Cancer Your health care provider may recommend that you be screened regularly for cancer of the pelvic organs (ovaries, uterus, and vagina). This screening involves a pelvic examination, including checking for microscopic changes to the surface of your cervix (Pap test). You may be encouraged to have this screening done every 3 years, beginning at age 56.  For women ages 69-65, health care providers may recommend pelvic exams and Pap testing every 3 years, or they may recommend the Pap and pelvic exam, combined with testing for human papilloma virus (HPV), every 5 years. Some types of HPV increase your risk of cervical cancer. Testing for HPV may also be done on women of any age with unclear Pap test results.  Other health care providers may not recommend any screening for nonpregnant women who are considered low risk for pelvic cancer and who do not have symptoms. Ask your health care provider if a screening pelvic exam is right for you.  If you have had past treatment for cervical cancer or a condition that could lead to cancer, you need Pap tests and screening for cancer for at least 20 years after your treatment. If Pap tests have been discontinued, your risk factors (such as having a new sexual partner) need to be reassessed to determine if screening should resume. Some women have medical problems that increase the chance of getting cervical cancer. In these cases, your health care provider may recommend more frequent screening and Pap tests.  Colorectal Cancer  This type of cancer can be detected and often prevented.  Routine colorectal cancer screening usually begins at 60 years of age and continues through 60 years of age.  Your health care provider may recommend screening at an earlier age if you have risk factors for colon cancer.  Your health care provider may also recommend using home test kits to check for hidden blood in the stool.  A small camera at the end of a  tube can be used to examine your colon directly (sigmoidoscopy or colonoscopy). This is done to check for the earliest forms of colorectal cancer.  Routine screening usually begins at age 19.  Direct examination of the colon should be repeated every 5-10 years through 60 years of age. However, you may need to be screened more often if early forms of precancerous polyps or small growths are found.  Skin Cancer  Check your skin from head to toe regularly.  Tell your health care provider about any new moles or changes in moles, especially if there is a change in a mole's shape or color.  Also tell your health care provider if you have a mole that is larger than the size of a pencil eraser.  Always use sunscreen. Apply sunscreen liberally and repeatedly throughout the day.  Protect yourself by wearing long sleeves, pants, a wide-brimmed hat, and sunglasses whenever you are outside.  Heart disease, diabetes, and high  blood pressure  High blood pressure causes heart disease and increases the risk of stroke. High blood pressure is more likely to develop in: ? People who have blood pressure in the high end of the normal range (130-139/85-89 mm Hg). ? People who are overweight or obese. ? People who are African American.  If you are 67-43 years of age, have your blood pressure checked every 3-5 years. If you are 21 years of age or older, have your blood pressure checked every year. You should have your blood pressure measured twice-once when you are at a hospital or clinic, and once when you are not at a hospital or clinic. Record the average of the two measurements. To check your blood pressure when you are not at a hospital or clinic, you can use: ? An automated blood pressure machine at a pharmacy. ? A home blood pressure monitor.  If you are between 59 years and 27 years old, ask your health care provider if you should take aspirin to prevent strokes.  Have regular diabetes screenings. This  involves taking a blood sample to check your fasting blood sugar level. ? If you are at a normal weight and have a low risk for diabetes, have this test once every three years after 60 years of age. ? If you are overweight and have a high risk for diabetes, consider being tested at a younger age or more often. Preventing infection Hepatitis B  If you have a higher risk for hepatitis B, you should be screened for this virus. You are considered at high risk for hepatitis B if: ? You were born in a country where hepatitis B is common. Ask your health care provider which countries are considered high risk. ? Your parents were born in a high-risk country, and you have not been immunized against hepatitis B (hepatitis B vaccine). ? You have HIV or AIDS. ? You use needles to inject street drugs. ? You live with someone who has hepatitis B. ? You have had sex with someone who has hepatitis B. ? You get hemodialysis treatment. ? You take certain medicines for conditions, including cancer, organ transplantation, and autoimmune conditions.  Hepatitis C  Blood testing is recommended for: ? Everyone born from [birth year] through 1963. ? Anyone with known risk factors for hepatitis C.  Sexually transmitted infections (STIs)  You should be screened for sexually transmitted infections (STIs) including gonorrhea and chlamydia if: ? You are sexually active and are younger than 60 years of age. ? You are older than 60 years of age and your health care provider tells you that you are at risk for this type of infection. ? Your sexual activity has changed since you were last screened and you are at an increased risk for chlamydia or gonorrhea. Ask your health care provider if you are at risk.  If you do not have HIV, but are at risk, it may be recommended that you take a prescription medicine daily to prevent HIV infection. This is called pre-exposure prophylaxis (PrEP). You are considered at risk if: ? You are  sexually active and do not regularly use condoms or know the HIV status of your partner(s). ? You take drugs by injection. ? You are sexually active with a partner who has HIV.  Talk with your health care provider about whether you are at high risk of being infected with HIV. If you choose to begin PrEP, you should first be tested for HIV. You should then be tested  every 3 months for as long as you are taking PrEP. Pregnancy  If you are premenopausal and you may become pregnant, ask your health care provider about preconception counseling.  If you may become pregnant, take 400 to 800 micrograms (mcg) of folic acid every day.  If you want to prevent pregnancy, talk to your health care provider about birth control (contraception). Osteoporosis and menopause  Osteoporosis is a disease in which the bones lose minerals and strength with aging. This can result in serious bone fractures. Your risk for osteoporosis can be identified using a bone density scan.  If you are 26 years of age or older, or if you are at risk for osteoporosis and fractures, ask your health care provider if you should be screened.  Ask your health care provider whether you should take a calcium or vitamin D supplement to lower your risk for osteoporosis.  Menopause may have certain physical symptoms and risks.  Hormone replacement therapy may reduce some of these symptoms and risks. Talk to your health care provider about whether hormone replacement therapy is right for you. Follow these instructions at home:  Schedule regular health, dental, and eye exams.  Stay current with your immunizations.  Do not use any tobacco products including cigarettes, chewing tobacco, or electronic cigarettes.  If you are pregnant, do not drink alcohol.  If you are breastfeeding, limit how much and how often you drink alcohol.  Limit alcohol intake to no more than 1 drink per day for nonpregnant women. One drink equals 12 ounces of  beer, 5 ounces of wine, or 1 ounces of hard liquor.  Do not use street drugs.  Do not share needles.  Ask your health care provider for help if you need support or information about quitting drugs.  Tell your health care provider if you often feel depressed.  Tell your health care provider if you have ever been abused or do not feel safe at home. This information is not intended to replace advice given to you by your health care provider. Make sure you discuss any questions you have with your health care provider. Document Released: 09/09/2010 Document Revised: 08/02/2015 Document Reviewed: 11/28/2014 Elsevier Interactive Patient Education  2018 [place] protect organs, store calcium, and anchor muscles. Good health habits, such as eating nutritious foods and exercising regularly, are important for maintaining healthy bones. They can also help to prevent a condition that causes bones to lose density and become weak and brittle (osteoporosis). Why is bone mass important? Bone mass refers to the amount of bone tissue that you have. The higher your bone mass, the stronger your bones. An important step toward having healthy bones throughout life is to have strong and dense bones during childhood. A young adult who has a high bone mass is more likely to have a high bone mass later in life. Bone mass at its greatest it is called peak bone mass. A large decline in bone mass occurs in older adults. In women, it occurs about the time of menopause. During this time, it is important to practice good health habits, because if more bone is lost than what is replaced, the bones will become less healthy and more likely to break (fracture). If you find that you have a low bone mass, you may be able to prevent osteoporosis or further bone loss by changing your diet and lifestyle. How can I find out if my bone mass is low? Bone mass can  be measured with an X-ray test that is called a  bone mineral density (BMD) test. This test is recommended for all women who are age 23 or older. It may also be recommended for men who are age 58 or older, or for people who are more likely to develop osteoporosis due to:  Having bones that break easily.  Having a long-term disease that weakens bones, such as kidney disease or rheumatoid arthritis.  Having menopause earlier than normal.  Taking medicine that weakens bones, such as steroids, thyroid hormones, or hormone treatment for breast cancer or prostate cancer.  Smoking.  Drinking three or more alcoholic drinks each day.  What are the nutritional recommendations for healthy bones? To have healthy bones, you need to get enough of the right minerals and vitamins. Most nutrition experts recommend getting these nutrients from the foods that you eat. Nutritional recommendations vary from person to person. Ask your health care provider what is healthy for you. Here are some general guidelines. Calcium Recommendations Calcium is the most important (essential) mineral for bone health. Most people can get enough calcium from their diet, but supplements may be recommended for people who are at risk for osteoporosis. Good sources of calcium include:  Dairy products, such as low-fat or nonfat milk, cheese, and yogurt.  Dark green leafy vegetables, such as bok choy and broccoli.  Calcium-fortified foods, such as orange juice, cereal, bread, soy beverages, and tofu products.  Nuts, such as almonds.  Follow these recommended amounts for daily calcium intake:  Children, age 55?3: 700 mg.  Children, age 69?8: 1,000 mg.  Children, age 558?13: 1,300 mg.  Teens, age 56?18: 1,300 mg.  Adults, age 23?50: 1,000 mg.  Adults, age 92?70: ? Men: 1,000 mg. ? Women: 1,200 mg.  Adults, age 691 or older: 1,200 mg.  Pregnant and breastfeeding females: ? Teens: 1,300 mg. ? Adults: 1,000 mg.  Vitamin D Recommendations Vitamin D is the most essential  vitamin for bone health. It helps the body to absorb calcium. Sunlight stimulates the skin to make vitamin D, so be sure to get enough sunlight. If you live in a cold climate or you do not get outside often, your health care provider may recommend that you take vitamin D supplements. Good sources of vitamin D in your diet include:  Egg yolks.  Saltwater fish.  Milk and cereal fortified with vitamin D.  Follow these recommended amounts for daily vitamin D intake:  Children and teens, age 20?18: 59 international units.  Adults, age 7 or younger: 400-800 international units.  Adults, age 699 or older: 800-1,000 international units.  Other Nutrients Other nutrients for bone health include:  Phosphorus. This mineral is found in meat, poultry, dairy foods, nuts, and legumes. The recommended daily intake for adult men and adult women is 700 mg.  Magnesium. This mineral is found in seeds, nuts, dark green vegetables, and legumes. The recommended daily intake for adult men is 400?420 mg. For adult women, it is 310?320 mg.  Vitamin K. This vitamin is found in green leafy vegetables. The recommended daily intake is 120 mg for adult men and 90 mg for adult women.  What type of physical activity is best for building and maintaining healthy bones? Weight-bearing and strength-building activities are important for building and maintaining peak bone mass. Weight-bearing activities cause muscles and bones to work against gravity. Strength-building activities increases muscle strength that supports bones. Weight-bearing and muscle-building activities include:  Walking and hiking.  Jogging and running.  Dancing.  Gym exercises.  Lifting weights.  Tennis and racquetball.  Climbing stairs.  Aerobics.  Adults should get at least 30 minutes of moderate physical activity on most days. Children should get at least 60 minutes of moderate physical activity on most days. Ask your health care provide  what type of exercise is best for you. Where can I find more information? For more information, check out the following websites:  [place]: [employer]se  [employer]: http://www.niams.AnonymousEar.fr.asp  This information is not intended to replace advice given to you by your health care provider. Make sure you discuss any questions you have with your health care provider. Document Released: 05/17/2003 Document Revised: 09/14/2015 Document Reviewed: 03/01/2014 Elsevier Interactive Patient Education  [employer].

## 2017-05-19 ENCOUNTER — Encounter: Payer: Self-pay | Admitting: Internal Medicine

## 2017-06-16 IMAGING — CR DG CHEST 2V
2 series · 2 of 2 positions shown · non-contrast
Comparison: CT 06/01/2015

CLINICAL DATA: Mitral valve regurgitation

EXAM:
CHEST  2 VIEW

[w chest pa]
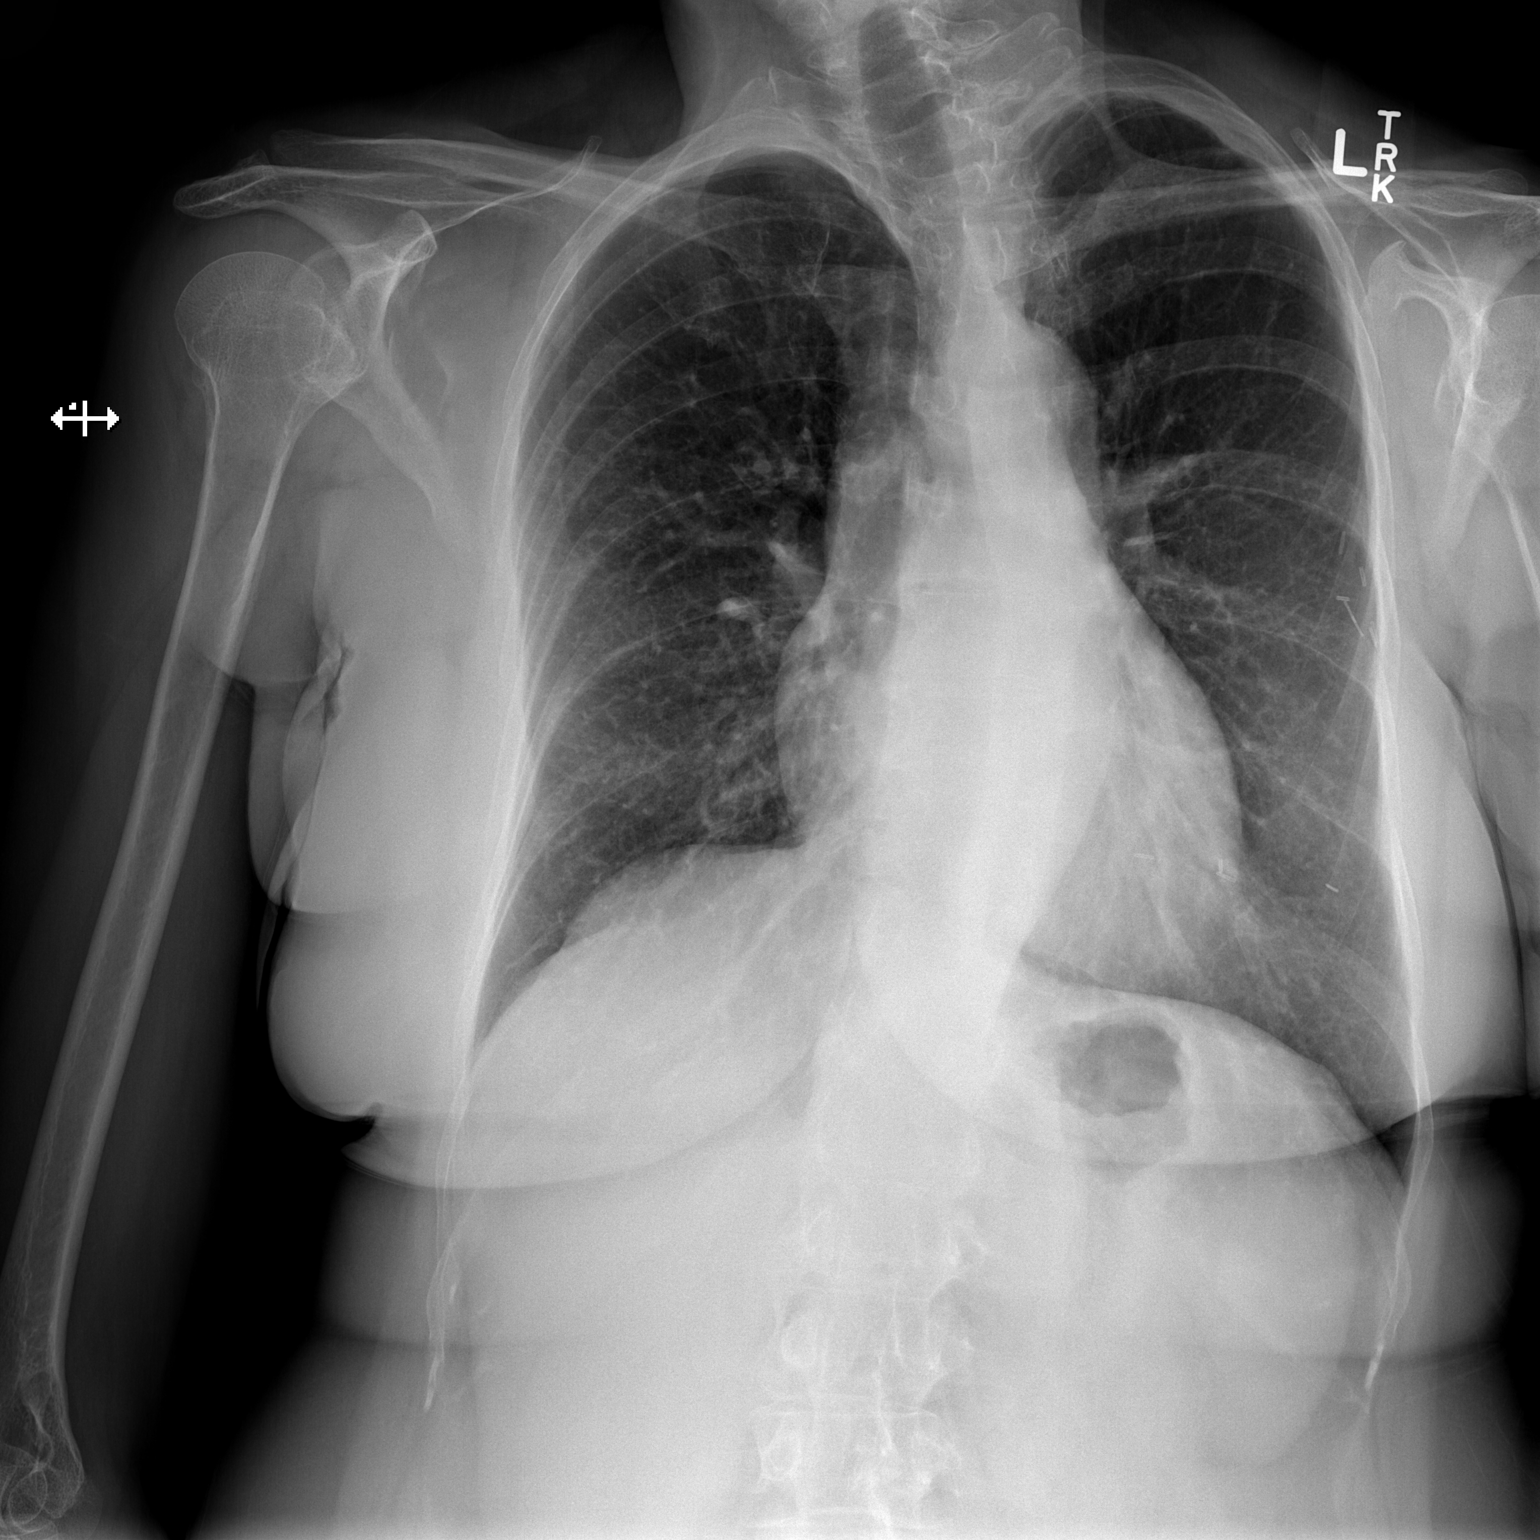

[w chest lat]
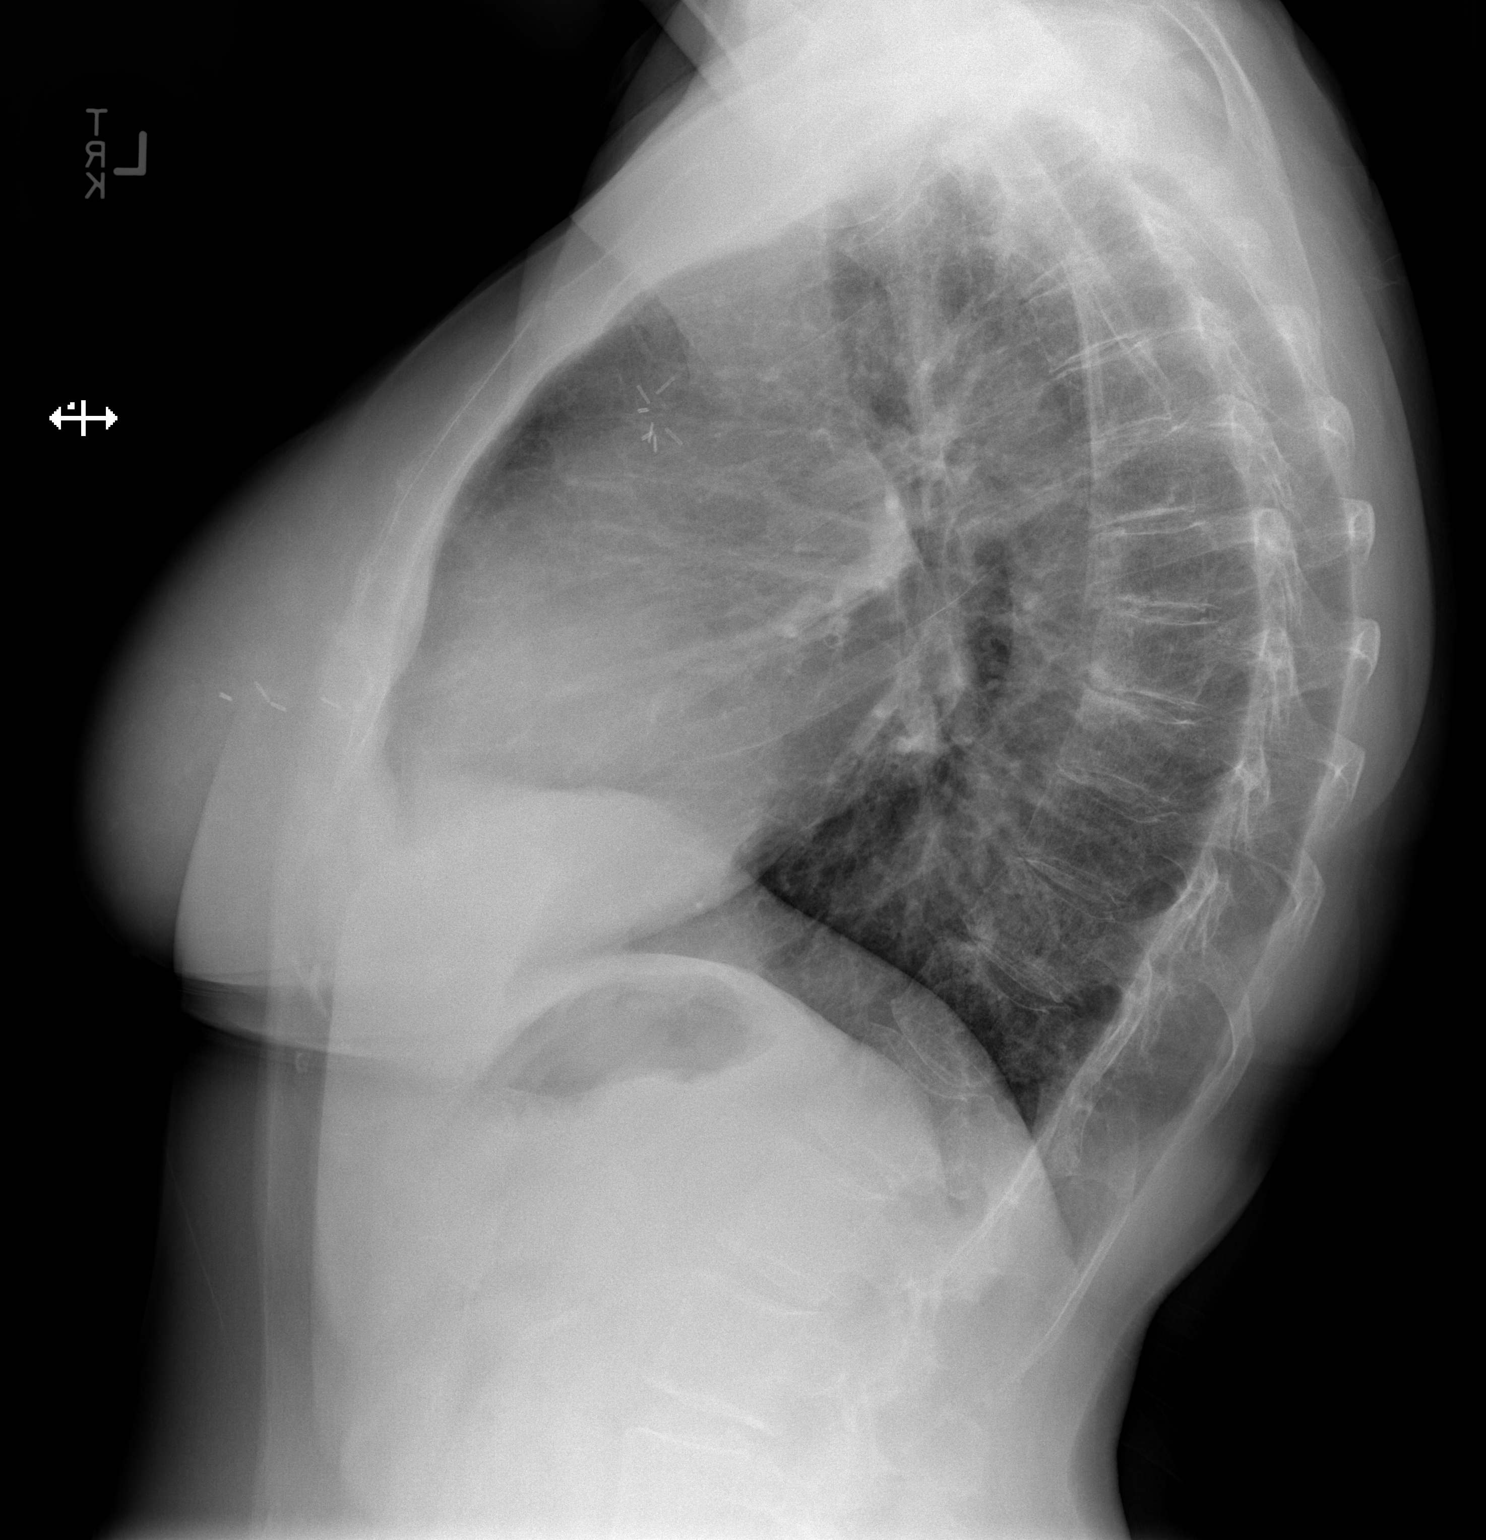

[2 of 2 positions shown; findings below may reference images not displayed]

FINDINGS: Heart and mediastinal contours are within normal limits. No focal
opacities or effusions. No acute bony abnormality.
IMPRESSION: No active cardiopulmonary disease.

## 2017-06-19 IMAGING — CR DG CHEST 1V PORT
1 series · 1 of 1 positions shown · non-contrast
Comparison: June 28, 2015

CLINICAL DATA: Atelectasis. Status post mitral valve repair for
mitral regurgitation. History breast carcinoma

EXAM:
PORTABLE CHEST 1 VIEW

[AP]
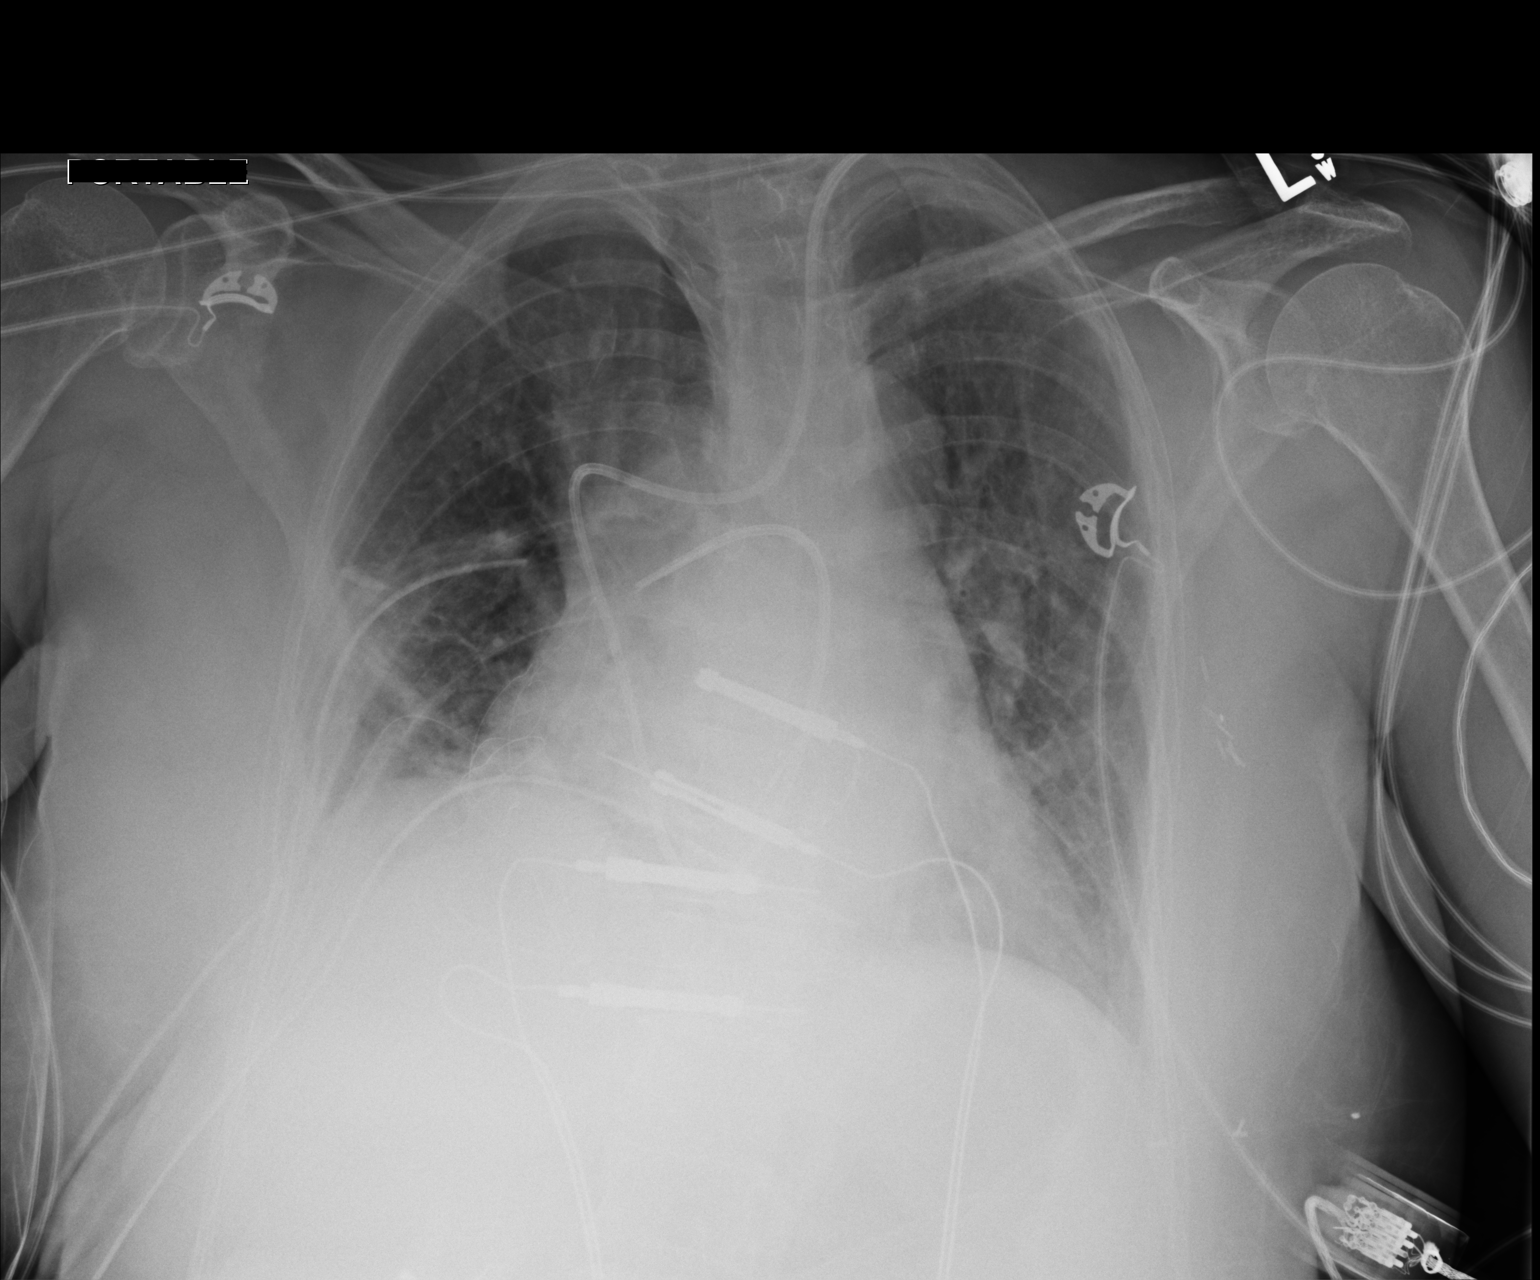

[1 of 1 positions shown; findings below may reference images not displayed]

FINDINGS: Endotracheal tube and nasogastric tube have been removed. Swan-Ganz
catheter tip is in the right main pulmonary artery. Right chest tube
position and mediastinal drain unchanged. Pacemaker wires are
attached to the right heart. No pneumothorax. There is persistent
atelectatic change in the right mid and lower lung zones, stable.
There is a minimal right pleural effusion. Left lung is clear. Heart
is borderline enlarged with pulmonary vascularity within normal
limits. Patient is status post mitral valve replacement. There are
surgical clips in left axillary region.
IMPRESSION: Endotracheal tube and nasogastric tube have been removed. Other tube
and catheter positions are unchanged. No pneumothorax. Patchy
atelectasis in the right mid and lower lung zones remains stable as
does minimal right pleural effusion.

## 2017-06-20 IMAGING — CR DG CHEST 1V PORT
1 series · 1 of 1 positions shown · non-contrast
Comparison: 06/28/2015 and 06/29/2015.

CLINICAL DATA: Followup for atelectasis. Status post cardiac
surgery and mitral valve repair.

EXAM:
PORTABLE CHEST 1 VIEW

[AP]
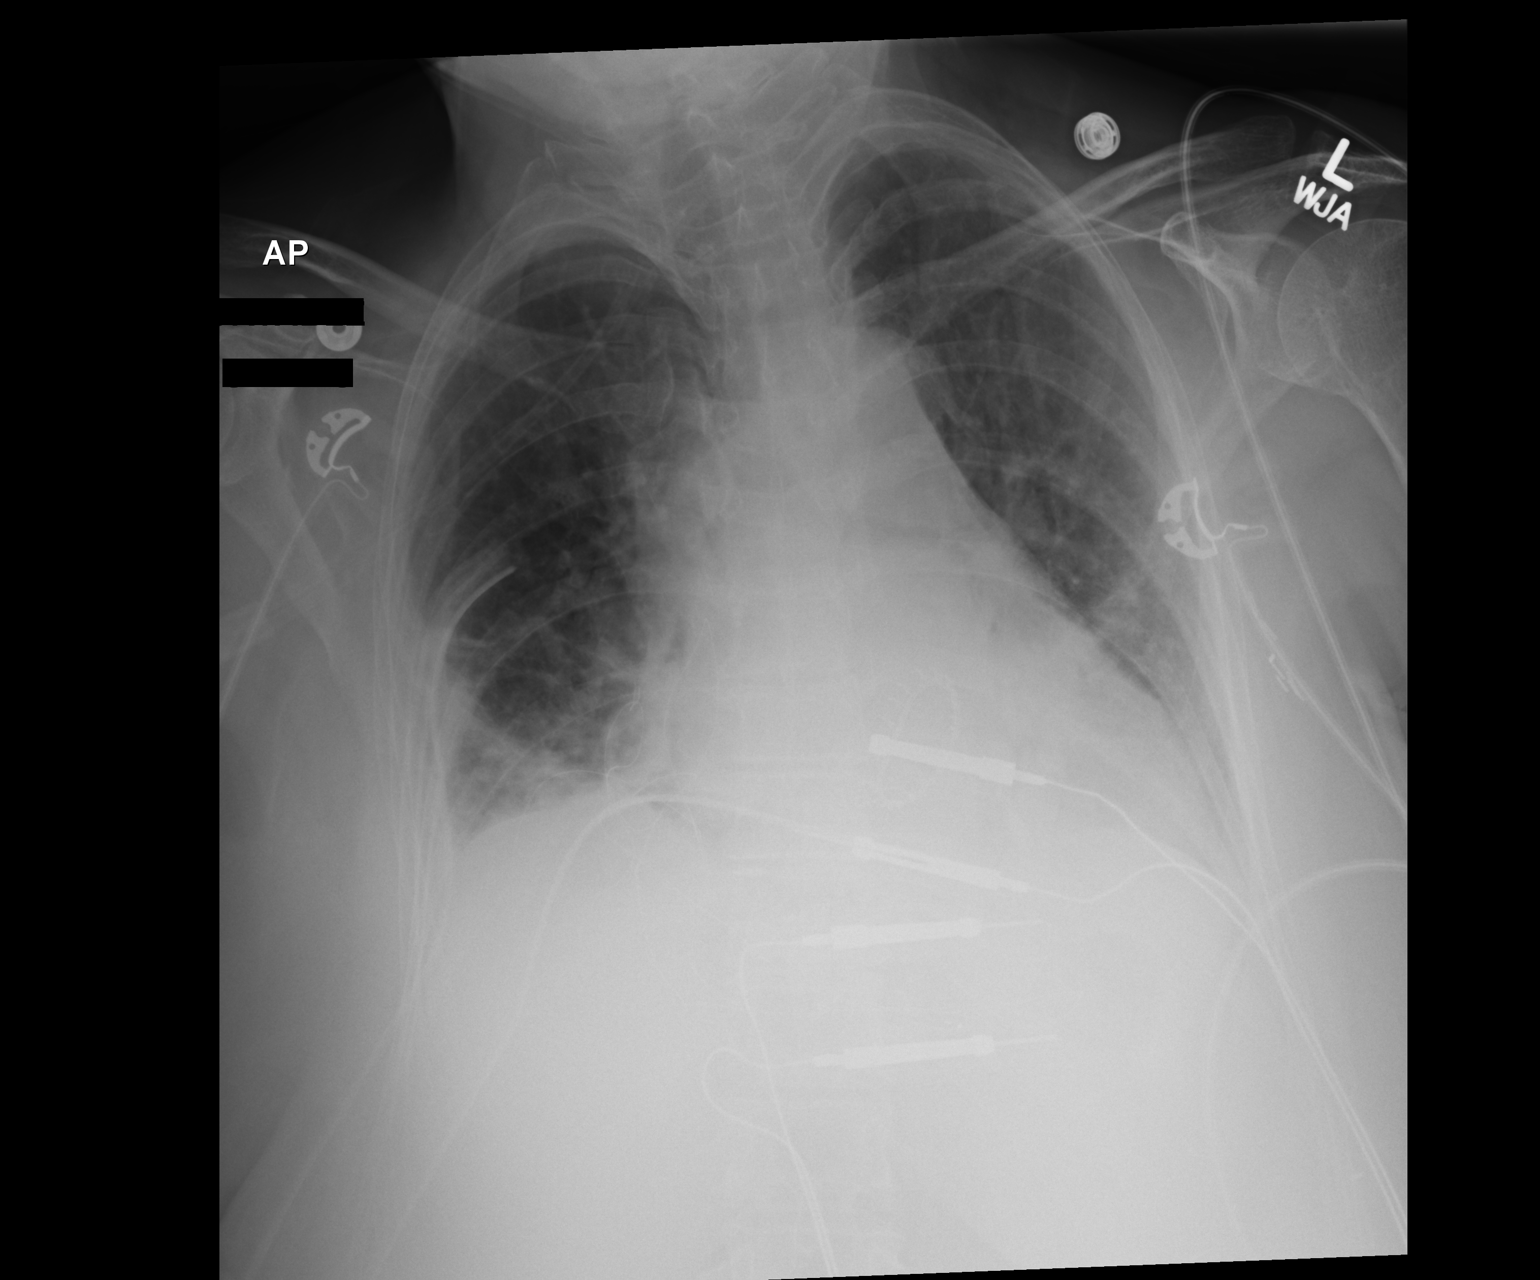

[1 of 1 positions shown; findings below may reference images not displayed]

FINDINGS: Mild enlargement of the cardiopericardial silhouette, stable. No
mediastinal widening.

Right greater than left lung base atelectasis. This is stable from
the most recent prior exam. No convincing pulmonary edema. No
pneumothorax.

Right chest tube tip projects over the right mid lung, also stable.

Left internal jugular Swan-Ganz catheter has been removed.
IMPRESSION: 1. No acute findings in the lungs or evidence of an operative
complication.
2. Persistent basilar atelectasis, right greater than left.
3. No pneumothorax.  Right chest tube is stable.
4. No mediastinal widening.

## 2017-06-21 IMAGING — CR DG CHEST 1V PORT
1 series · 1 of 1 positions shown · non-contrast
Comparison: 06/30/2015

CLINICAL DATA: Mitral valve regurgitation

EXAM:
PORTABLE CHEST 1 VIEW

[AP]
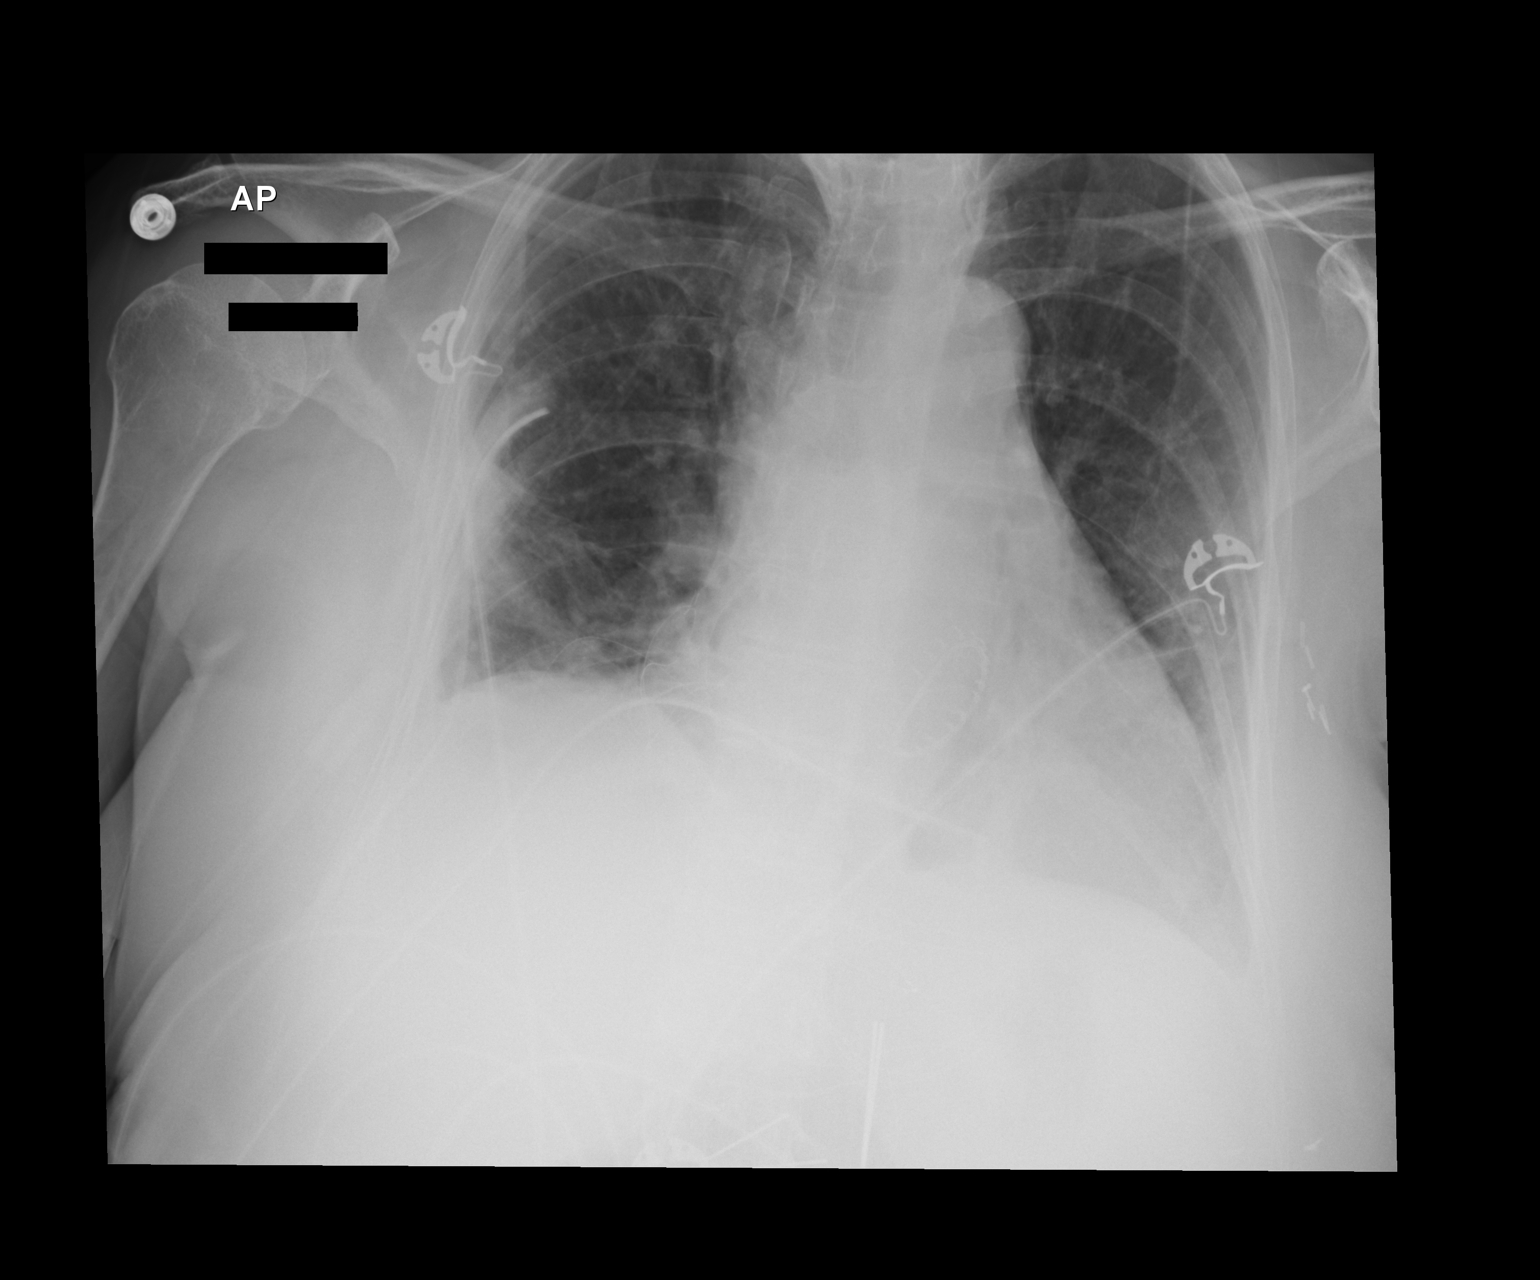

[1 of 1 positions shown; findings below may reference images not displayed]

FINDINGS: Stable cardiac silhouette with prosthetic heart valve. RIGHT chest
tube in place without pneumothorax. Mild basilar atelectasis.
IMPRESSION: 1. No significant change.  Mild basilar atelectasis.
2. No pneumothorax.

## 2017-06-30 ENCOUNTER — Other Ambulatory Visit: Payer: Self-pay | Admitting: Internal Medicine

## 2017-06-30 DIAGNOSIS — Z1231 Encounter for screening mammogram for malignant neoplasm of breast: Secondary | ICD-10-CM

## 2017-07-20 ENCOUNTER — Ambulatory Visit
Admission: RE | Admit: 2017-07-20 | Discharge: 2017-07-20 | Disposition: A | Discharge status: Home / Self Care | Payer: BLUE CROSS/BLUE SHIELD | Source: Ambulatory Visit | Attending: Internal Medicine | Admitting: Internal Medicine

## 2017-07-20 DIAGNOSIS — Z1231 Encounter for screening mammogram for malignant neoplasm of breast: Secondary | ICD-10-CM | POA: Diagnosis not present

## 2017-07-20 HISTORY — DX: Personal history of irradiation: Z92.3

## 2017-07-20 HISTORY — DX: Personal history of antineoplastic chemotherapy: Z92.21

## 2017-07-27 ENCOUNTER — Encounter: Payer: Self-pay | Admitting: Family Medicine

## 2017-07-27 ENCOUNTER — Ambulatory Visit (INDEPENDENT_AMBULATORY_CARE_PROVIDER_SITE_OTHER): Payer: BLUE CROSS/BLUE SHIELD | Admitting: Family Medicine

## 2017-07-27 VITALS — BP 110/62 | HR 76 | Temp 97.9°F | Ht 66.0 in | Wt 162.1 lb

## 2017-07-27 DIAGNOSIS — T63484A Toxic effect of venom of other arthropod, undetermined, initial encounter: Secondary | ICD-10-CM

## 2017-07-27 MED ORDER — TRIAMCINOLONE ACETONIDE 0.1 % EX CREA: 1.0000 "application " | TOPICAL_CREAM | Freq: Two times a day (BID) | CUTANEOUS | 0 refills | Status: DC

## 2017-07-27 NOTE — Progress Notes (Signed)
HPI:  Using dictation device. Unfortunately this device frequently misinterprets words/phrases.  Acute visit for fly or insect sting/bite: -was on riding lawnmower yesterday and fly like insect landed on and bit her L hand -she has developed some local pain, swelling in the L hand and wrist -no treatment, hx local reaction to horseflys -no fevers, sob, hives, systemic symptoms  ROS: See pertinent positives and negatives per HPI.  Past Medical History:  Diagnosis Date  . Allergic rhinitis   . Breast cancer ([place])   . Gallstone   . History of chickenpox    as a child  . 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 HX: breast cancer    hx of 2002 left er/pr 3.2cm  . Osteoporosis    L5  . Personal history of chemotherapy 2002  . Personal history of radiation therapy 2002  . 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 abd [place]     gallstones CT gallstones 1/06  . BREAST LUMPECTOMY Left 2002  . CARDIAC CATHETERIZATION N/A 05/29/2015   Procedure: Right/Left Heart Cath and Coronary Angiography;  Surgeon: Peter M Martinique, MD;  Location: [facility];  Service: Cardiovascular;  Laterality: N/A;  . COLONOSCOPY    . MITRAL VALVE REPAIR Right 06/28/2015   Procedure: MINIMALLY INVASIVE MITRAL VALVE REPAIR (MVR);  Surgeon: Rexene Alberts, MD;  Location: [facility];  Service: Open Heart Surgery;  Laterality: Right;  32 Sorin Memo 3D Ring  . PARATHYROIDECTOMY  11/06   left inferior  . partial left mastectomy     . TEE WITHOUT CARDIOVERSION N/A 04/25/2015   Procedure: TRANSESOPHAGEAL  ECHOCARDIOGRAM (TEE);  Surgeon: Thayer Headings, MD;  Location: [facility];  Service: Cardiovascular;  Laterality: N/A;  . TEE WITHOUT CARDIOVERSION N/A 06/28/2015   Procedure: TRANSESOPHAGEAL ECHOCARDIOGRAM (TEE);  Surgeon: Rexene Alberts, MD;  Location: [facility];  Service: Open Heart Surgery;  Laterality: N/A;    Family History  Problem Relation Age of Onset  . Osteoporosis Maternal Aunt   . Colon cancer Maternal Aunt   . Thyroid disease Unknown   . Heart murmur Brother        Picked up on exam followed by echo.  . Colon cancer Mother   . Valvular heart disease Father        followed     SOCIAL HX: see hpi   Current Outpatient Medications:  .  aspirin EC 81 MG tablet, Take 1 tablet (81 mg total) by mouth daily., Disp: , Rfl:  .  clindamycin (CLEOCIN) 150 MG capsule, TAKE FOUR CAPSULES BY MOUTH ONE HOUR PRIOR TO DENTAL WORK, Disp: 4 capsule, Rfl: 6 .  hydrochlorothiazide (HYDRODIURIL) 25 MG tablet, TAKE 1 TABLET BY MOUTH ONCE DAILY, Disp: 90 tablet, Rfl: 2 .  KLOR-CON M20 20 MEQ tablet, TAKE 1 TABLET BY MOUTH ONCE DAILY, Disp: 90 tablet, Rfl: 2 .  metoprolol tartrate (LOPRESSOR) 25 MG tablet, TAKE 1 TABLET BY MOUTH TWICE DAILY, Disp: 180 tablet, Rfl: 1 .  triamcinolone cream (KENALOG) 0.1 %, Apply 1 application topically 2 (two) times daily., Disp: 30  g, Rfl: 0  EXAM:  Vitals:   07/27/17 1032  BP: 110/62  Pulse: 76  Temp: 97.9 F (36.6 C)  SpO2: 97%    Body mass index is 26.16 kg/m.  GENERAL: vitals reviewed and listed above, alert, oriented, appears well hydrated and in no acute distress  HEENT: atraumatic, conjunttiva clear, no obvious abnormalities on inspection of external nose and ears  NECK: no obvious masses on inspection  SKIN: sting or bite mark L dorsal hand with surrounding edema and erythema of the hand and forearm without significant warmth, discharge, double bite markes, ulcerations; normal cap refill  PSYCH: pleasant and cooperative, no obvious  depression or anxiety  ASSESSMENT AND PLAN:  Discussed the following assessment and plan:  Local reaction to insect sting, undetermined intent, initial encounter  -likely local reaction, no signs 2ndary infection at this time -opted to treat with compresses, elevation, antihistamine, top steroid -Patient advised to return or notify a doctor immediately if symptoms worsen or persist or new concerns arise.  She declined avs  There are no Patient Instructions on file for this visit.  Lucretia Kern, DO

## 2017-09-02 ENCOUNTER — Other Ambulatory Visit: Payer: Self-pay | Admitting: Cardiovascular Disease

## 2017-09-22 ENCOUNTER — Ambulatory Visit (INDEPENDENT_AMBULATORY_CARE_PROVIDER_SITE_OTHER)
Admission: RE | Admit: 2017-09-22 | Discharge: 2017-09-22 | Disposition: A | Discharge status: Home / Self Care | Payer: BLUE CROSS/BLUE SHIELD | Source: Ambulatory Visit | Attending: Internal Medicine | Admitting: Internal Medicine

## 2017-09-22 DIAGNOSIS — E2839 Other primary ovarian failure: Secondary | ICD-10-CM

## 2017-09-22 DIAGNOSIS — M8580 Other specified disorders of bone density and structure, unspecified site: Secondary | ICD-10-CM

## 2017-09-22 DIAGNOSIS — Z853 Personal history of malignant neoplasm of breast: Secondary | ICD-10-CM

## 2017-09-23 DIAGNOSIS — E2839 Other primary ovarian failure: Secondary | ICD-10-CM | POA: Diagnosis not present

## 2017-12-08 ENCOUNTER — Other Ambulatory Visit: Payer: Self-pay | Admitting: Cardiovascular Disease

## 2017-12-31 ENCOUNTER — Other Ambulatory Visit: Payer: Self-pay | Admitting: Cardiovascular Disease

## 2018-01-10 ENCOUNTER — Other Ambulatory Visit: Payer: Self-pay | Admitting: Cardiovascular Disease

## 2018-01-11 ENCOUNTER — Telehealth: Payer: Self-pay | Admitting: Cardiovascular Disease

## 2018-01-11 MED ORDER — CLINDAMYCIN HCL 150 MG PO CAPS: ORAL_CAPSULE | ORAL | 6 refills | Status: DC

## 2018-01-11 NOTE — Telephone Encounter (Signed)
Pt's pharmacy [facility] requesting a refill on cleocin 150 mg tablet for dental appt. Request came in on 01/10/18, on a Sunday, pt has appt on 01/11/18. Please address

## 2018-01-11 NOTE — Telephone Encounter (Signed)
Medication has been refilled.

## 2018-01-26 ENCOUNTER — Other Ambulatory Visit: Payer: Self-pay | Admitting: Cardiovascular Disease

## 2018-02-15 ENCOUNTER — Other Ambulatory Visit: Payer: Self-pay | Admitting: Cardiovascular Disease

## 2018-02-17 ENCOUNTER — Encounter: Payer: Self-pay | Admitting: Cardiovascular Disease

## 2018-02-25 ENCOUNTER — Encounter: Payer: Self-pay | Admitting: Cardiovascular Disease

## 2018-02-25 ENCOUNTER — Ambulatory Visit (INDEPENDENT_AMBULATORY_CARE_PROVIDER_SITE_OTHER): Payer: BLUE CROSS/BLUE SHIELD | Admitting: Cardiovascular Disease

## 2018-02-25 VITALS — BP 120/84 | HR 70 | Ht 66.0 in | Wt 167.8 lb

## 2018-02-25 DIAGNOSIS — Z9889 Other specified postprocedural states: Secondary | ICD-10-CM | POA: Diagnosis not present

## 2018-02-25 DIAGNOSIS — I340 Nonrheumatic mitral (valve) insufficiency: Secondary | ICD-10-CM | POA: Diagnosis not present

## 2018-02-25 MED ORDER — METOPROLOL TARTRATE 25 MG PO TABS: 25.0000 mg | ORAL_TABLET | Freq: Two times a day (BID) | ORAL | 3 refills | Status: DC

## 2018-02-25 MED ORDER — POTASSIUM CHLORIDE CRYS ER 20 MEQ PO TBCR: 20.0000 meq | EXTENDED_RELEASE_TABLET | Freq: Every day | ORAL | 3 refills | Status: DC

## 2018-02-25 MED ORDER — HYDROCHLOROTHIAZIDE 25 MG PO TABS: 25.0000 mg | ORAL_TABLET | Freq: Every day | ORAL | 3 refills | Status: DC

## 2018-02-25 NOTE — Patient Instructions (Signed)
Medication Instructions:  Your physician recommends that you continue on your current medications as directed. Please refer to the Current Medication list given to you today.  If you need a refill on your cardiac medications before your next appointment, please call your pharmacy.   Lab work: None If you have labs (blood work) drawn today and your tests are completely normal, you will receive your results only by: Marland Kitchen MyChart Message (if you have MyChart) OR . A paper copy in the mail If you have any lab test that is abnormal or we need to change your treatment, we will call you to review the results.  Testing/Procedures: None  Follow-Up: At [facility], you and your health needs are our priority.  As part of our continuing mission to provide you with exceptional heart care, we have created designated Provider Care Teams.  These Care Teams include your primary Cardiologist (physician) and Advanced Practice Providers (APPs -  Physician Assistants and Nurse Practitioners) who all work together to provide you with the care you need, when you need it. You will need a follow up appointment in:  2 years.  Please call our office 2 months in advance to schedule this appointment.  You may see Dr. Acie Fredrickson or one of the following Advanced Practice Providers on your designated Care Team: Richardson Dopp, PA-C [place], [place] . Daune Perch, NP  Any Other Special Instructions Will Be Listed Below (If Applicable).

## 2018-02-25 NOTE — Progress Notes (Signed)
Cardiology Office Note   Date:  02/25/2018   ID:  Tracy Chen, DOB 02-04-1956, MRN [id number]  PCP:  Burnis Medin, MD  Cardiologist:   Mertie Moores, MD   Chief Complaint  Patient presents with  . Mitral Valve Prolapse   Problem List 1. 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 MV repair , June 28, 2015 3. Breast cancer . Left breast, s/p chemo and XRT   Tracy Chen is a 62 y.o. female who presents for evaluation of a heart murmur. She was recently found have a heart murmur on exam. She had an echo card gram which revealed mitral valve prolapse involving the posterior leaflet. She has associated severe mitral regurgitation.   no hx of murmur in the past.  No CP or dyspnea.   Exercises regularly without problems.  No limitations  August 13, 2015:  Tracy Chen is seen today for follow up of her MV repair ( June 28, 2015)  Healing up .  Feeling some better Trying to exercise.    HR is fast  Has a "white cloud" over her left eye for a few minutes over [employer] day  No other neurologic deficits.    September 27, 2015:    Doing great.  S/p MV repair. Did not Take her metoprolol this point. Her heart rate is a little elevated as result.  Sept. 24, 2018:  She saw Richardson Dopp in tapering. She continues to do well. Follow-up echocardiogram in August, 2018 revealed normal left ventricle systolic function.  Dec. 19, 2019: Doing well  No cp or dyspnea  Not exercising much    Past Medical History:  Diagnosis Date  . Allergic rhinitis   . Breast cancer ([place])   . Gallstone   . History of chickenpox    as a child  . 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 HX: breast cancer    hx of 2002 left er/pr 3.2cm  . Osteoporosis    L5  . Personal history of chemotherapy 2002  . Personal history of radiation therapy 2002  . S/P minimally invasive mitral valve repair  06/28/2015   Complex valvuloplasty including triangular resection of flail segment of posterior leaflet, artificial Gore-tex neochord placement x4 and 32 mm Sorin Memo 3D ring annuloplasty via right mini-thoracotomy incision    Past Surgical History:  Procedure Laterality Date  . abd [place]     gallstones CT gallstones 1/06  . BREAST LUMPECTOMY Left 2002  . CARDIAC CATHETERIZATION N/A 05/29/2015   Procedure: Right/Left Heart Cath and Coronary Angiography;  Surgeon: Peter M Martinique, MD;  Location: [facility];  Service: Cardiovascular;  Laterality: N/A;  . COLONOSCOPY    . MITRAL VALVE REPAIR Right 06/28/2015   Procedure: MINIMALLY INVASIVE MITRAL VALVE REPAIR (MVR);  Surgeon: Rexene Alberts, MD;  Location: [facility];  Service: Open Heart Surgery;  Laterality: Right;  32 Sorin Memo 3D Ring  . PARATHYROIDECTOMY  11/06   left inferior  . partial left mastectomy     . TEE WITHOUT CARDIOVERSION N/A 04/25/2015   Procedure: TRANSESOPHAGEAL ECHOCARDIOGRAM (TEE);  Surgeon: Thayer Headings, MD;  Location: [facility];  Service: Cardiovascular;  Laterality: N/A;  . TEE WITHOUT CARDIOVERSION N/A 06/28/2015   Procedure: TRANSESOPHAGEAL ECHOCARDIOGRAM (TEE);  Surgeon: Rexene Alberts, MD;  Location: [facility];  Service: Open Heart Surgery;  Laterality: N/A;     Current Outpatient Medications  Medication Sig Dispense Refill  . aspirin EC 81 MG tablet Take 1 tablet (81 mg total) by mouth daily.    . clindamycin (CLEOCIN) 150 MG capsule TAKE FOUR CAPSULES BY MOUTH ONE HOUR PRIOR TO DENTAL WORK 4 capsule 6  . hydrochlorothiazide (HYDRODIURIL) 25 MG tablet Take 1 tablet (25 mg total) by mouth daily. 180 tablet 3  . metoprolol tartrate (LOPRESSOR) 25 MG tablet Take 1 tablet (25 mg total) by mouth 2 (two) times daily. 180 tablet 3  . potassium chloride SA (K-DUR,KLOR-CON) 20 MEQ tablet Take 1 tablet (20 mEq total) by mouth daily. 90 tablet 3  . triamcinolone cream (KENALOG) 0.1 % Apply 1 application topically 2  (two) times daily. 30 g 0   No current facility-administered medications for this visit.     Allergies:   Penicillins    Social History:  The patient  reports that she has never smoked. She has never used smokeless tobacco. She reports that she does not drink alcohol or use drugs.   Family History:  The patient's family history includes Colon cancer in her maternal aunt and mother; Heart murmur in her brother; Osteoporosis in her maternal aunt; Thyroid disease in her unknown relative; Valvular heart disease in her father.    ROS:  Please see the history of present illness.      Physical Exam: Blood pressure 120/84, pulse 70, height 5\' 6"  (1.676 m), weight 167 lb 12.8 oz (76.1 kg), SpO2 97 %.  GEN:  Well nourished, well developed in no acute distress HEENT: Normal NECK: No JVD; No carotid bruits LYMPHATICS: No lymphadenopathy CARDIAC: RRR ,  Right lateral scar from minimally invasive MV surgery is well healed.  RESPIRATORY:  Clear to auscultation without rales, wheezing or rhonchi  ABDOMEN: Soft, non-tender, non-distended MUSCULOSKELETAL:  No edema; No deformity  SKIN: Warm and dry NEUROLOGIC:  Alert and oriented x 3    EKG:   February 25, 2018: Normal sinus rhythm at 70 beats minute.  Normal EKG.    Recent Labs: 04/29/2017: Hemoglobin 13.1; Platelets 371.0; TSH 0.80    Lipid Panel    Component Value Date/Time   CHOL 132 04/29/2017 1127   TRIG 117.0 04/29/2017 1127   TRIG 62 12/26/2005 1000   HDL 34.30 (L) 04/29/2017 1127   CHOLHDL 4 04/29/2017 1127   VLDL 23.4 04/29/2017 1127   LDLCALC 74 04/29/2017 1127      Wt Readings from Last 3 Encounters:  02/25/18 167 lb 12.8 oz (76.1 kg)  07/27/17 162 lb 1.6 oz (73.5 kg)  04/29/17 162 lb 1.6 oz (73.5 kg)      Other studies Reviewed: Additional studies/ records that were reviewed today include: . Review of the above records demonstrates:    ASSESSMENT AND PLAN:  1.  MVP, / MR S/p MV repair.    Doing great.    No symptoms Will see her in 1-2 years     2. Tachycardia -   Resolved.   3.  HTN:    BP is well controlled.   Return in 2 years.  Have advised her to exercise    Current medicines are reviewed at length with the patient today.  The patient does not have concerns regarding medicines.  The following changes have been made:  no change  Labs/ tests ordered today include:   Orders Placed This Encounter  Procedures  . EKG 12-Lead  Mertie Moores, MD  02/25/2018 9:14 AM    [address], [place], [place]  [id number] Phone: [phone]; Fax: [phone]

## 2018-11-26 ENCOUNTER — Other Ambulatory Visit: Payer: Self-pay | Admitting: Internal Medicine

## 2018-11-26 DIAGNOSIS — Z1231 Encounter for screening mammogram for malignant neoplasm of breast: Secondary | ICD-10-CM

## 2018-11-30 ENCOUNTER — Other Ambulatory Visit: Payer: Self-pay

## 2018-11-30 ENCOUNTER — Ambulatory Visit
Admission: RE | Admit: 2018-11-30 | Discharge: 2018-11-30 | Disposition: A | Discharge status: Home / Self Care | Payer: BC Managed Care – PPO | Source: Ambulatory Visit | Attending: Internal Medicine | Admitting: Internal Medicine

## 2018-11-30 DIAGNOSIS — Z1231 Encounter for screening mammogram for malignant neoplasm of breast: Secondary | ICD-10-CM

## 2019-01-17 NOTE — Progress Notes (Signed)
Chief Complaint  Patient presents with   Annual Exam    Pt has no concerns today     HPI: Patient  Tracy Chen  63 y.o. comes in today for Preventive Health Care visit  soing wee  Sees  cards dr Acie Fredrickson very 2 years    a now    Doing well.  bp has been good  utd on mammogram  No new  Sx dx   Health Maintenance  Topic Date Due   HIV Screening  04/23/1970   PAP SMEAR-Modifier  01/27/2021 (Originally 02/04/2019)   MAMMOGRAM  11/29/2020   COLONOSCOPY  05/22/2024   TETANUS/TDAP  02/03/2026   INFLUENZA VACCINE  Completed   Hepatitis C Screening  Completed   Health Maintenance Review LIFESTYLE:  Exercise:   Moderate.  tkes starirs stretching   Tobacco/ETS:  no Alcohol:  n Sugar beverages:no Sleep:about 7  Drug use: no HH of  2 no pets  Work:43 per week.  Home and out.     ROS:  GEN/ HEENT: No fever, significant weight changes sweats headaches vision problems hearing changes, CV/ PULM; No chest pain shortness of breath cough, syncope,edema  change in exercise tolerance. GI /GU: No adominal pain, vomiting, change in bowel habits. No blood in the stool. No significant GU symptoms. SKIN/HEME: ,no acute skin rashes suspicious lesions or bleeding. No lymphadenopathy, nodules, masses.  NEURO/ PSYCH:  No neurologic signs such as weakness numbness. No depression anxiety. IMM/ Allergy: No unusual infections.  Allergy .   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   Complex valvuloplasty including triangular resection of flail segment of posterior leaflet, artificial Gore-tex neochord placement x4 and 32 mm Sorin Memo 3D ring annuloplasty via right mini-thoracotomy incision    Past Surgical History:  Procedure Laterality Date   abd [place]     gallstones CT gallstones 1/06   BREAST LUMPECTOMY Left 2002   CARDIAC CATHETERIZATION N/A 05/29/2015   Procedure: Right/Left Heart Cath and Coronary Angiography;  Surgeon: Peter M Martinique, MD;  Location: [facility];  Service: Cardiovascular;  Laterality: N/A;   COLONOSCOPY     MITRAL VALVE REPAIR Right 06/28/2015   Procedure: MINIMALLY INVASIVE MITRAL VALVE REPAIR (MVR);  Surgeon: Rexene Alberts, MD;  Location: [facility];  Service: Open Heart Surgery;  Laterality: Right;  26 Sorin Memo 3D Ring   PARATHYROIDECTOMY  11/06   left inferior   partial left mastectomy      TEE WITHOUT CARDIOVERSION N/A 04/25/2015   Procedure: TRANSESOPHAGEAL ECHOCARDIOGRAM (TEE);  Surgeon: Thayer Headings, MD;  Location: [facility];  Service: Cardiovascular;  Laterality: N/A;   TEE WITHOUT CARDIOVERSION N/A 06/28/2015   Procedure: TRANSESOPHAGEAL ECHOCARDIOGRAM (TEE);  Surgeon: Rexene Alberts, MD;  Location: [facility];  Service: Open  Heart Surgery;  Laterality: N/A;    Family History  Problem Relation Age of Onset   Osteoporosis Maternal Aunt    Colon cancer Maternal Aunt    Thyroid disease Other    Heart murmur Brother        Picked up on exam followed by echo.   Colon cancer Mother    Valvular heart disease Father        followed     Social History   Socioeconomic History   Marital status: Married    Spouse name: Not on file   Number of children: Not on file   Years of education: Not on file   Highest education level: Not on file  Occupational History   Not on file  Social Needs   Financial resource strain: Not on file   Food insecurity    Worry: Not on  file    Inability: Not on file   Transportation needs    Medical: Not on file    Non-medical: Not on file  Tobacco Use   Smoking status: Never Smoker   Smokeless tobacco: Never Used  Substance and Sexual Activity   Alcohol use: No    Alcohol/week: 0.0 standard drinks   Drug use: No   Sexual activity: Not on file  Lifestyle   Physical activity    Days per week: Not on file    Minutes per session: Not on file   Stress: Not on file  Relationships   Social connections    Talks on phone: Not on file    Gets together: Not on file    Attends religious service: Not on file    Active member of club or organization: Not on file    Attends meetings of clubs or organizations: Not on file    Relationship status: Not on file  Other Topics Concern   Not on file  Social History Narrative   Regular exercise- some   HH of 3     Pets outside goats and chicken.   1 cat    No ets.   Now working in [employer] office . 40 hours  Stopped second job At [employer]   26+   Neg ets   G2P2   6.5-7 hours    Neg ta caffiene .     Outpatient Medications Prior to Visit  Medication Sig Dispense Refill   aspirin EC 81 MG tablet Take 1 tablet (81 mg total) by mouth daily.     clindamycin (CLEOCIN) 150 MG capsule TAKE FOUR CAPSULES BY MOUTH ONE HOUR PRIOR TO DENTAL WORK 4 capsule 6   hydrochlorothiazide (HYDRODIURIL) 25 MG tablet Take 1 tablet (25 mg total) by mouth daily. 180 tablet 3   metoprolol tartrate (LOPRESSOR) 25 MG tablet Take 1 tablet (25 mg total) by mouth 2 (two) times daily. 180 tablet 3   potassium chloride SA (K-DUR,KLOR-CON) 20 MEQ tablet Take 1 tablet (20 mEq total) by mouth daily. 90 tablet 3   triamcinolone cream (KENALOG) 0.1 % Apply 1 application topically 2 (two) times daily. (Patient not taking: Reported on 01/18/2019) 30 g 0   No facility-administered medications prior to visit.      EXAM:  BP 116/64 (BP Location: Right Arm, Patient Position: Sitting, Cuff Size:  Normal)    Pulse 82    Temp 97.6 F (36.4 C) (Temporal)    Ht 5' 6.75" (1.695 m)    Wt 155 lb 9.6 oz (70.6 kg)    SpO2 99%  BMI 24.55 kg/m   Body mass index is 24.55 kg/m. Wt Readings from Last 3 Encounters:  01/18/19 155 lb 9.6 oz (70.6 kg)  02/25/18 167 lb 12.8 oz (76.1 kg)  07/27/17 162 lb 1.6 oz (73.5 kg)    Physical Exam: Vital signs reviewed [id number] is a well-developed well-nourished alert cooperative    who appearsr stated age in no acute distress.  HEENT: normocephalic atraumatic , Eyes: PERRL EOM's full, conjunctiva clear, , Ears: no deformity EAC's clear TMs with normal landmarks. Mouth: clear OP,masked NECK: supple without masses, thyromegaly or bruits. CHEST/PULM:  Clear to auscultation and percussion breath sounds equal no wheeze , rales or rhonchi. No chest wall deformities or tenderness. Breast: normal by inspection . No dimpling, discharge, masses, tenderness or discharge . Left  Surgical   Scar  And no masses  CV: PMI is nondisplaced, S1 S2 no gallops, murmurs, rubs. Peripheral pulses are full without delay.No JVD .  ABDOMEN: Bowel sounds normal nontender  No guard or rebound, no hepato splenomegal no CVA tenderness.   Extremtities:  No clubbing cyanosis or edema, no acute joint swelling or redness no focal atrophy NEURO:  Oriented x3, cranial nerves 3-12 appear to be intact, no obvious focal weakness,gait within normal limits no abnormal reflexes or asymmetrical SKIN: No acute rashes normal turgor, color, no bruising or petechiae. PSYCH: Oriented, good eye contact, no obvious depression anxiety, cognition and judgment appear normal. LN: no cervical axillary inguinal adenopathy  Lab Results  Component Value Date   WBC 6.1 04/29/2017   HGB 13.1 04/29/2017   HCT 38.8 04/29/2017   PLT 371.0 04/29/2017   GLUCOSE 89 12/01/2016   CHOL 132 04/29/2017   TRIG 117.0 04/29/2017   HDL 34.30 (L) 04/29/2017   LDLCALC 74 04/29/2017   ALT 18 02/04/2016   AST 16 02/04/2016    NA 140 12/01/2016   K 4.0 12/01/2016   CL 99 12/01/2016   CREATININE 0.69 12/01/2016   BUN 9 12/01/2016   CO2 27 12/01/2016   TSH 0.80 04/29/2017   INR 1.9 09/20/2015   HGBA1C 5.5 06/26/2015    BP Readings from Last 3 Encounters:  01/18/19 116/64  02/25/18 120/84  07/27/17 110/62    Lab plan  reviewed with patient   ASSESSMENT AND PLAN:  Discussed the following assessment and plan:    ICD-10-CM   1. Visit for preventive health examination  123456 Basic metabolic panel    CBC with Differential    Hepatic function panel    Lipid panel    TSH    Vitamin D 25 hydroxy  2. Low bone density  [id number] Basic metabolic panel    CBC with Differential    Hepatic function panel    Lipid panel    TSH    Vitamin D 25 hydroxy  3. Medication management  123456 Basic metabolic panel    CBC with Differential    Hepatic function panel    Lipid panel    TSH    Vitamin D 25 hydroxy  4. 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 S/P minimally invasive mitral valve repair  Z98.890   6. HX: breast cancer  Z85.3   dexa last year    -2.6 rfn   Disc p Vit d  Resistance training and repeat dexa next year before cpx planned    Consider  bone building therapy as indicated .  Record review  Do PAP next year  Patient Care Team: Burnis Medin, MD as PCP -  General Gatha Mayer, MD (Gastroenterology) Nahser, Wonda Cheng, MD as Consulting Physician (Cardiology) Patient Instructions  Glad you are doing well .   Continue attention to  Weight bearing exercise  And resistance  Exercise for bone health and getting 1000 iiu Vit d per day  Advise  A fu bene density next year     Next July or after before your next  cpx .  Call for order please .  That way we can discuss at your  cpx .    Health Maintenance, Female Adopting a healthy lifestyle and getting preventive care are important in promoting health and wellness. Ask your health care provider about:  The right schedule for you to have regular tests and  exams.  Things you can do on your own to prevent diseases and keep yourself healthy. What should I know about diet, weight, and exercise? Eat a healthy diet   Eat a diet that includes plenty of vegetables, fruits, low-fat dairy products, and lean protein.  Do not eat a lot of foods that are high in solid fats, added sugars, or sodium. Maintain a healthy weight Body mass index (BMI) is used to identify weight problems. It estimates body fat based on height and weight. Your health care provider can help determine your BMI and help you achieve or maintain a healthy weight. Get regular exercise Get regular exercise. This is one of the most important things you can do for your health. Most adults should:  Exercise for at least 150 minutes each week. The exercise should increase your heart rate and make you sweat (moderate-intensity exercise).  Do strengthening exercises at least twice a week. This is in addition to the moderate-intensity exercise.  Spend less time sitting. Even light physical activity can be beneficial. Watch cholesterol and blood lipids Have your blood tested for lipids and cholesterol at 61 years of age, then have this test every 5 years. Have your cholesterol levels checked more often if:  Your lipid or cholesterol levels are high.  You are older than 61 years of age.  You are at high risk for heart disease. What should I know about cancer screening? Depending on your health history and family history, you may need to have cancer screening at various ages. This may include screening for:  Breast cancer.  Cervical cancer.  Colorectal cancer.  Skin cancer.  Lung cancer. What should I know about heart disease, diabetes, and high blood pressure? Blood pressure and heart disease  High blood pressure causes heart disease and increases the risk of stroke. This is more likely to develop in people who have high blood pressure readings, are of African descent, or are  overweight.  Have your blood pressure checked: ? Every 3-5 years if you are 63-28 years of age. ? Every year if you are 9 years old or older. Diabetes Have regular diabetes screenings. This checks your fasting blood sugar level. Have the screening done:  Once every three years after age 64 if you are at a normal weight and have a low risk for diabetes.  More often and at a younger age if you are overweight or have a high risk for diabetes. What should I know about preventing infection? Hepatitis B If you have a higher risk for hepatitis B, you should be screened for this virus. Talk with your health care provider to find out if you are at risk for hepatitis B infection. Hepatitis C Testing is recommended for:  Everyone born from  1943 through [birth year].  Anyone with known risk factors for hepatitis C. Sexually transmitted infections (STIs)  Get screened for STIs, including gonorrhea and chlamydia, if: ? You are sexually active and are younger than 61 years of age. ? You are older than 61 years of age and your health care provider tells you that you are at risk for this type of infection. ? Your sexual activity has changed since you were last screened, and you are at increased risk for chlamydia or gonorrhea. Ask your health care provider if you are at risk.  Ask your health care provider about whether you are at high risk for HIV. Your health care provider may recommend a prescription medicine to help prevent HIV infection. If you choose to take medicine to prevent HIV, you should first get tested for HIV. You should then be tested every 3 months for as long as you are taking the medicine. Pregnancy  If you are about to stop having your period (premenopausal) and you may become pregnant, seek counseling before you get pregnant.  Take 400 to 800 micrograms (mcg) of folic acid every day if you become pregnant.  Ask for birth control (contraception) if you want to prevent  pregnancy. Osteoporosis and menopause Osteoporosis is a disease in which the bones lose minerals and strength with aging. This can result in bone fractures. If you are 56 years old or older, or if you are at risk for osteoporosis and fractures, ask your health care provider if you should:  Be screened for bone loss.  Take a calcium or vitamin D supplement to lower your risk of fractures.  Be given hormone replacement therapy (HRT) to treat symptoms of menopause. Follow these instructions at home: Lifestyle  Do not use any products that contain nicotine or tobacco, such as cigarettes, e-cigarettes, and chewing tobacco. If you need help quitting, ask your health care provider.  Do not use street drugs.  Do not share needles.  Ask your health care provider for help if you need support or information about quitting drugs. Alcohol use  Do not drink alcohol if: ? Your health care provider tells you not to drink. ? You are pregnant, may be pregnant, or are planning to become pregnant.  If you drink alcohol: ? Limit how much you use to 0-1 drink a day. ? Limit intake if you are breastfeeding.  Be aware of how much alcohol is in your drink. In the [place] one drink equals one 12 oz bottle of beer (355 mL), one 5 oz glass of wine (148 mL), or one 1 oz glass of hard liquor (44 mL). General instructions  Schedule regular health, dental, and eye exams.  Stay current with your vaccines.  Tell your health care provider if: ? You often feel depressed. ? You have ever been abused or do not feel safe at home. Summary  Adopting a healthy lifestyle and getting preventive care are important in promoting health and wellness.  Follow your health care provider's instructions about healthy diet, exercising, and getting tested or screened for diseases.  Follow your health care provider's instructions on monitoring your cholesterol and blood pressure. This information is not intended to replace  advice given to you by your health care provider. Make sure you discuss any questions you have with your health care provider. Document Released: 09/09/2010 Document Revised: 02/17/2018 Document Reviewed: 02/17/2018 Elsevier Patient Education  2020 [place] protect organs, store calcium, anchor muscles, and support the  whole body. Keeping your bones strong is important, especially as you get older. You can take actions to help keep your bones strong and healthy. Why is keeping my bones healthy important?  Keeping your bones healthy is important because your body constantly replaces bone cells. Cells get old, and new cells take their place. As we age, we lose bone cells because the body may not be able to make enough new cells to replace the old cells. The amount of bone cells and bone tissue you have is referred to as bone mass. The higher your bone mass, the stronger your bones. The aging process leads to an overall loss of bone mass in the body, which can increase the likelihood of:  Joint pain and stiffness.  Broken bones.  A condition in which the bones become weak and brittle (osteoporosis). A large decline in bone mass occurs in older adults. In women, it occurs about the time of menopause. What actions can I take to keep my bones healthy? Good health habits are important for maintaining healthy bones. This includes eating nutritious foods and exercising regularly. To have healthy bones, you need to get enough of the right minerals and vitamins. Most nutrition experts recommend getting these nutrients from the foods that you eat. In some cases, taking supplements may also be recommended. Doing certain types of exercise is also important for bone health. What are the nutritional recommendations for healthy bones?  Eating a well-balanced diet with plenty of calcium and vitamin D will help to protect your bones. Nutritional recommendations vary from person to  person. Ask your health care provider what is healthy for you. Here are some general guidelines. Get enough calcium Calcium is the most important (essential) mineral for bone health. Most people can get enough calcium from their diet, but supplements may be recommended for people who are at risk for osteoporosis. Good sources of calcium include:  Dairy products, such as low-fat or nonfat milk, cheese, and yogurt.  Dark green leafy vegetables, such as bok choy and broccoli.  Calcium-fortified foods, such as orange juice, cereal, bread, soy beverages, and tofu products.  Nuts, such as almonds. Follow these recommended amounts for daily calcium intake:  Children, age 74-3: 700 mg.  Children, age 96-8: 1,000 mg.  Children, age 96-13: 1,300 mg.  Teens, age 29-18: 1,300 mg.  Adults, age 749-50: 1,000 mg.  Adults, age 962-70: ? Men: 1,000 mg. ? Women: 1,200 mg.  Adults, age 32 or older: 1,200 mg.  Pregnant and breastfeeding females: ? Teens: 1,300 mg. ? Adults: 1,000 mg. Get enough vitamin D Vitamin D is the most essential vitamin for bone health. It helps the body absorb calcium. Sunlight stimulates the skin to make vitamin D, so be sure to get enough sunlight. If you live in a cold climate or you do not get outside often, your health care provider may recommend that you take vitamin D supplements. Good sources of vitamin D in your diet include:  Egg yolks.  Saltwater fish.  Milk and cereal fortified with vitamin D. Follow these recommended amounts for daily vitamin D intake:  Children and teens, age 74-18: 600 international units.  Adults, age 75 or younger: 400-800 international units.  Adults, age 90 or older: 800-1,000 international units. Get other important nutrients Other nutrients that are important for bone health include:  Phosphorus. This mineral is found in meat, poultry, dairy foods, nuts, and legumes. The recommended daily intake for adult men and adult women is  700  mg.  Magnesium. This mineral is found in seeds, nuts, dark green vegetables, and legumes. The recommended daily intake for adult men is 400-420 mg. For adult women, it is 310-320 mg.  Vitamin K. This vitamin is found in green leafy vegetables. The recommended daily intake is 120 mg for adult men and 90 mg for adult women. What type of physical activity is best for building and maintaining healthy bones? Weight-bearing and strength-building activities are important for building and maintaining healthy bones. Weight-bearing activities cause muscles and bones to work against gravity. Strength-building activities increase the strength of the muscles that support bones. Weight-bearing and muscle-building activities include:  Walking and hiking.  Jogging and running.  Dancing.  Gym exercises.  Lifting weights.  Tennis and racquetball.  Climbing stairs.  Aerobics. Adults should get at least 30 minutes of moderate physical activity on most days. Children should get at least 60 minutes of moderate physical activity on most days. Ask your health care provider what type of exercise is best for you. How can I find out if my bone mass is low? Bone mass can be measured with an X-ray test called a bone mineral density (BMD) test. This test is recommended for all women who are age 76 or older. It may also be recommended for:  Men who are age 79 or older.  People who are at risk for osteoporosis because of: ? Having bones that break easily. ? Having a long-term disease that weakens bones, such as kidney disease or rheumatoid arthritis. ? Having menopause earlier than normal. ? Taking medicine that weakens bones, such as steroids, thyroid hormones, or hormone treatment for breast cancer or prostate cancer. ? Smoking. ? Drinking three or more alcoholic drinks a day. If you find that you have a low bone mass, you may be able to prevent osteoporosis or further bone loss by changing your diet and  lifestyle. Where can I find more information? For more information, check out the following websites:  [place]: AviationTales.fr  [employer] of Health: www.bones.SouthExposed.es  International Osteoporosis Foundation: Administrator.iofbonehealth.org Summary  The aging process leads to an overall loss of bone mass in the body, which can increase the likelihood of broken bones and osteoporosis.  Eating a well-balanced diet with plenty of calcium and vitamin D will help to protect your bones.  Weight-bearing and strength-building activities are also important for building and maintaining strong bones.  Bone mass can be measured with an X-ray test called a bone mineral density (BMD) test. This information is not intended to replace advice given to you by your health care provider. Make sure you discuss any questions you have with your health care provider. Document Released: 05/17/2003 Document Revised: 03/23/2017 Document Reviewed: 03/23/2017 Elsevier Patient Education  2020 [place] Chanese Hartsough M.D.

## 2019-01-18 ENCOUNTER — Other Ambulatory Visit: Payer: Self-pay

## 2019-01-18 ENCOUNTER — Ambulatory Visit (INDEPENDENT_AMBULATORY_CARE_PROVIDER_SITE_OTHER): Payer: BC Managed Care – PPO | Admitting: Internal Medicine

## 2019-01-18 ENCOUNTER — Encounter: Payer: Self-pay | Admitting: Internal Medicine

## 2019-01-18 VITALS — BP 116/64 | HR 82 | Temp 97.6°F | Ht 66.75 in | Wt 155.6 lb

## 2019-01-18 DIAGNOSIS — Z0000 Encounter for general adult medical examination without abnormal findings: Secondary | ICD-10-CM | POA: Diagnosis not present

## 2019-01-18 DIAGNOSIS — Z79899 Other long term (current) drug therapy: Secondary | ICD-10-CM

## 2019-01-18 DIAGNOSIS — E2839 Other primary ovarian failure: Secondary | ICD-10-CM

## 2019-01-18 DIAGNOSIS — Z853 Personal history of malignant neoplasm of breast: Secondary | ICD-10-CM

## 2019-01-18 DIAGNOSIS — M859 Disorder of bone density and structure, unspecified: Secondary | ICD-10-CM

## 2019-01-18 DIAGNOSIS — M8580 Other specified disorders of bone density and structure, unspecified site: Secondary | ICD-10-CM | POA: Diagnosis not present

## 2019-01-18 DIAGNOSIS — Z9889 Other specified postprocedural states: Secondary | ICD-10-CM

## 2019-01-18 LAB — BASIC METABOLIC PANEL
BUN: 11 mg/dL (ref 6–23)
CO2: 31 mEq/L (ref 19–32)
Calcium: 10.5 mg/dL (ref 8.4–10.5)
Chloride: 100 mEq/L (ref 96–112)
Creatinine, Ser: 0.7 mg/dL (ref 0.40–1.20)
GFR: 84.86 mL/min (ref >60.00)
Glucose, Bld: 89 mg/dL (ref 70–99)
Potassium: 4.5 mEq/L (ref 3.5–5.1)
Sodium: 138 mEq/L (ref 135–145)

## 2019-01-18 LAB — CBC WITH DIFFERENTIAL/PLATELET
Basophils Absolute: 0 10*3/uL (ref 0.0–0.1)
Basophils Relative: 0.4 % (ref 0.0–3.0)
Eosinophils Absolute: 0.1 10*3/uL (ref 0.0–0.7)
Eosinophils Relative: 1.7 % (ref 0.0–5.0)
HCT: 39.8 % (ref 36.0–46.0)
Hemoglobin: 13.4 g/dL (ref 12.0–15.0)
Lymphocytes Relative: 22.1 % (ref 12.0–46.0)
Lymphs Abs: 1.6 10*3/uL (ref 0.7–4.0)
MCHC: 33.7 g/dL (ref 30.0–36.0)
MCV: 87.6 fl (ref 78.0–100.0)
Monocytes Absolute: 0.6 10*3/uL (ref 0.1–1.0)
Monocytes Relative: 8 % (ref 3.0–12.0)
Neutro Abs: 4.8 10*3/uL (ref 1.4–7.7)
Neutrophils Relative %: 67.8 % (ref 43.0–77.0)
Platelets: 362 10*3/uL (ref 150.0–400.0)
RBC: 4.55 Mil/uL (ref 3.87–5.11)
RDW: 13.7 % (ref 11.5–15.5)
WBC: 7 10*3/uL (ref 4.0–10.5)

## 2019-01-18 LAB — HEPATIC FUNCTION PANEL
ALT: 19 U/L (ref 0–35)
AST: 20 U/L (ref 0–37)
Albumin: 4.7 g/dL (ref 3.5–5.2)
Alkaline Phosphatase: 87 U/L (ref 39–117)
Bilirubin, Direct: 0.1 mg/dL (ref 0.0–0.3)
Total Bilirubin: 0.7 mg/dL (ref 0.2–1.2)
Total Protein: 7.3 g/dL (ref 6.0–8.3)

## 2019-01-18 LAB — TSH: TSH: 1.27 u[IU]/mL (ref 0.35–4.50)

## 2019-01-18 LAB — LIPID PANEL
Cholesterol: 173 mg/dL (ref 0–200)
HDL: 48.9 mg/dL (ref >39.00)
LDL Cholesterol: 104 mg/dL — ABNORMAL HIGH (ref 0–99)
NonHDL: 124.4
Total CHOL/HDL Ratio: 4
Triglycerides: 102 mg/dL (ref 0.0–149.0)
VLDL: 20.4 mg/dL (ref 0.0–40.0)

## 2019-01-18 LAB — VITAMIN D 25 HYDROXY (VIT D DEFICIENCY, FRACTURES): VITD: 37.24 ng/mL (ref 30.00–100.00)

## 2019-01-18 NOTE — Patient Instructions (Addendum)
Glad you are doing well .   Continue attention to  Weight bearing exercise  And resistance  Exercise for bone health and getting 1000 iiu Vit d per day  Advise  A fu bene density next year     Next July or after before your next  cpx .  Call for order please .  That way we can discuss at your  cpx .    Health Maintenance, Female Adopting a healthy lifestyle and getting preventive care are important in promoting health and wellness. Ask your health care provider about:  The right schedule for you to have regular tests and exams.  Things you can do on your own to prevent diseases and keep yourself healthy. What should I know about diet, weight, and exercise? Eat a healthy diet   Eat a diet that includes plenty of vegetables, fruits, low-fat dairy products, and lean protein.  Do not eat a lot of foods that are high in solid fats, added sugars, or sodium. Maintain a healthy weight Body mass index (BMI) is used to identify weight problems. It estimates body fat based on height and weight. Your health care provider can help determine your BMI and help you achieve or maintain a healthy weight. Get regular exercise Get regular exercise. This is one of the most important things you can do for your health. Most adults should:  Exercise for at least 150 minutes each week. The exercise should increase your heart rate and make you sweat (moderate-intensity exercise).  Do strengthening exercises at least twice a week. This is in addition to the moderate-intensity exercise.  Spend less time sitting. Even light physical activity can be beneficial. Watch cholesterol and blood lipids Have your blood tested for lipids and cholesterol at 61 years of age, then have this test every 5 years. Have your cholesterol levels checked more often if:  Your lipid or cholesterol levels are high.  You are older than 61 years of age.  You are at high risk for heart disease. What should I know about cancer  screening? Depending on your health history and family history, you may need to have cancer screening at various ages. This may include screening for:  Breast cancer.  Cervical cancer.  Colorectal cancer.  Skin cancer.  Lung cancer. What should I know about heart disease, diabetes, and high blood pressure? Blood pressure and heart disease  High blood pressure causes heart disease and increases the risk of stroke. This is more likely to develop in people who have high blood pressure readings, are of African descent, or are overweight.  Have your blood pressure checked: ? Every 3-5 years if you are 61-71 years of age. ? Every year if you are 60 years old or older. Diabetes Have regular diabetes screenings. This checks your fasting blood sugar level. Have the screening done:  Once every three years after age 72 if you are at a normal weight and have a low risk for diabetes.  More often and at a younger age if you are overweight or have a high risk for diabetes. What should I know about preventing infection? Hepatitis B If you have a higher risk for hepatitis B, you should be screened for this virus. Talk with your health care provider to find out if you are at risk for hepatitis B infection. Hepatitis C Testing is recommended for:  Everyone born from [birth year] through 1963.  Anyone with known risk factors for hepatitis C. Sexually transmitted infections (STIs)  Get  screened for STIs, including gonorrhea and chlamydia, if: ? You are sexually active and are younger than 61 years of age. ? You are older than 61 years of age and your health care provider tells you that you are at risk for this type of infection. ? Your sexual activity has changed since you were last screened, and you are at increased risk for chlamydia or gonorrhea. Ask your health care provider if you are at risk.  Ask your health care provider about whether you are at high risk for HIV. Your health care provider may  recommend a prescription medicine to help prevent HIV infection. If you choose to take medicine to prevent HIV, you should first get tested for HIV. You should then be tested every 3 months for as long as you are taking the medicine. Pregnancy  If you are about to stop having your period (premenopausal) and you may become pregnant, seek counseling before you get pregnant.  Take 400 to 800 micrograms (mcg) of folic acid every day if you become pregnant.  Ask for birth control (contraception) if you want to prevent pregnancy. Osteoporosis and menopause Osteoporosis is a disease in which the bones lose minerals and strength with aging. This can result in bone fractures. If you are 90 years old or older, or if you are at risk for osteoporosis and fractures, ask your health care provider if you should:  Be screened for bone loss.  Take a calcium or vitamin D supplement to lower your risk of fractures.  Be given hormone replacement therapy (HRT) to treat symptoms of menopause. Follow these instructions at home: Lifestyle  Do not use any products that contain nicotine or tobacco, such as cigarettes, e-cigarettes, and chewing tobacco. If you need help quitting, ask your health care provider.  Do not use street drugs.  Do not share needles.  Ask your health care provider for help if you need support or information about quitting drugs. Alcohol use  Do not drink alcohol if: ? Your health care provider tells you not to drink. ? You are pregnant, may be pregnant, or are planning to become pregnant.  If you drink alcohol: ? Limit how much you use to 0-1 drink a day. ? Limit intake if you are breastfeeding.  Be aware of how much alcohol is in your drink. In the [place] one drink equals one 12 oz bottle of beer (355 mL), one 5 oz glass of wine (148 mL), or one 1 oz glass of hard liquor (44 mL). General instructions  Schedule regular health, dental, and eye exams.  Stay current with your  vaccines.  Tell your health care provider if: ? You often feel depressed. ? You have ever been abused or do not feel safe at home. Summary  Adopting a healthy lifestyle and getting preventive care are important in promoting health and wellness.  Follow your health care provider's instructions about healthy diet, exercising, and getting tested or screened for diseases.  Follow your health care provider's instructions on monitoring your cholesterol and blood pressure. This information is not intended to replace advice given to you by your health care provider. Make sure you discuss any questions you have with your health care provider. Document Released: 09/09/2010 Document Revised: 02/17/2018 Document Reviewed: 02/17/2018 Elsevier Patient Education  2020 [place] protect organs, store calcium, anchor muscles, and support the whole body. Keeping your bones strong is important, especially as you get older. You can take actions to  help keep your bones strong and healthy. Why is keeping my bones healthy important?  Keeping your bones healthy is important because your body constantly replaces bone cells. Cells get old, and new cells take their place. As we age, we lose bone cells because the body may not be able to make enough new cells to replace the old cells. The amount of bone cells and bone tissue you have is referred to as bone mass. The higher your bone mass, the stronger your bones. The aging process leads to an overall loss of bone mass in the body, which can increase the likelihood of:  Joint pain and stiffness.  Broken bones.  A condition in which the bones become weak and brittle (osteoporosis). A large decline in bone mass occurs in older adults. In women, it occurs about the time of menopause. What actions can I take to keep my bones healthy? Good health habits are important for maintaining healthy bones. This includes eating nutritious foods and  exercising regularly. To have healthy bones, you need to get enough of the right minerals and vitamins. Most nutrition experts recommend getting these nutrients from the foods that you eat. In some cases, taking supplements may also be recommended. Doing certain types of exercise is also important for bone health. What are the nutritional recommendations for healthy bones?  Eating a well-balanced diet with plenty of calcium and vitamin D will help to protect your bones. Nutritional recommendations vary from person to person. Ask your health care provider what is healthy for you. Here are some general guidelines. Get enough calcium Calcium is the most important (essential) mineral for bone health. Most people can get enough calcium from their diet, but supplements may be recommended for people who are at risk for osteoporosis. Good sources of calcium include:  Dairy products, such as low-fat or nonfat milk, cheese, and yogurt.  Dark green leafy vegetables, such as bok choy and broccoli.  Calcium-fortified foods, such as orange juice, cereal, bread, soy beverages, and tofu products.  Nuts, such as almonds. Follow these recommended amounts for daily calcium intake:  Children, age 51-3: 700 mg.  Children, age 46-8: 1,000 mg.  Children, age 517-13: 1,300 mg.  Teens, age 467-18: 1,300 mg.  Adults, age 68-50: 1,000 mg.  Adults, age 79-70: ? Men: 1,000 mg. ? Women: 1,200 mg.  Adults, age 26 or older: 1,200 mg.  Pregnant and breastfeeding females: ? Teens: 1,300 mg. ? Adults: 1,000 mg. Get enough vitamin D Vitamin D is the most essential vitamin for bone health. It helps the body absorb calcium. Sunlight stimulates the skin to make vitamin D, so be sure to get enough sunlight. If you live in a cold climate or you do not get outside often, your health care provider may recommend that you take vitamin D supplements. Good sources of vitamin D in your diet include:  Egg yolks.  Saltwater  fish.  Milk and cereal fortified with vitamin D. Follow these recommended amounts for daily vitamin D intake:  Children and teens, age 51-18: 600 international units.  Adults, age 31 or younger: 400-800 international units.  Adults, age 70 or older: 800-1,000 international units. Get other important nutrients Other nutrients that are important for bone health include:  Phosphorus. This mineral is found in meat, poultry, dairy foods, nuts, and legumes. The recommended daily intake for adult men and adult women is 700 mg.  Magnesium. This mineral is found in seeds, nuts, dark green vegetables, and legumes. The recommended daily  intake for adult men is 400-420 mg. For adult women, it is 310-320 mg.  Vitamin K. This vitamin is found in green leafy vegetables. The recommended daily intake is 120 mg for adult men and 90 mg for adult women. What type of physical activity is best for building and maintaining healthy bones? Weight-bearing and strength-building activities are important for building and maintaining healthy bones. Weight-bearing activities cause muscles and bones to work against gravity. Strength-building activities increase the strength of the muscles that support bones. Weight-bearing and muscle-building activities include:  Walking and hiking.  Jogging and running.  Dancing.  Gym exercises.  Lifting weights.  Tennis and racquetball.  Climbing stairs.  Aerobics. Adults should get at least 30 minutes of moderate physical activity on most days. Children should get at least 60 minutes of moderate physical activity on most days. Ask your health care provider what type of exercise is best for you. How can I find out if my bone mass is low? Bone mass can be measured with an X-ray test called a bone mineral density (BMD) test. This test is recommended for all women who are age 50 or older. It may also be recommended for:  Men who are age 75 or older.  People who are at risk  for osteoporosis because of: ? Having bones that break easily. ? Having a long-term disease that weakens bones, such as kidney disease or rheumatoid arthritis. ? Having menopause earlier than normal. ? Taking medicine that weakens bones, such as steroids, thyroid hormones, or hormone treatment for breast cancer or prostate cancer. ? Smoking. ? Drinking three or more alcoholic drinks a day. If you find that you have a low bone mass, you may be able to prevent osteoporosis or further bone loss by changing your diet and lifestyle. Where can I find more information? For more information, check out the following websites:  [place]: AviationTales.fr  [employer] of Health: www.bones.SouthExposed.es  International Osteoporosis Foundation: Administrator.iofbonehealth.org Summary  The aging process leads to an overall loss of bone mass in the body, which can increase the likelihood of broken bones and osteoporosis.  Eating a well-balanced diet with plenty of calcium and vitamin D will help to protect your bones.  Weight-bearing and strength-building activities are also important for building and maintaining strong bones.  Bone mass can be measured with an X-ray test called a bone mineral density (BMD) test. This information is not intended to replace advice given to you by your health care provider. Make sure you discuss any questions you have with your health care provider. Document Released: 05/17/2003 Document Revised: 03/23/2017 Document Reviewed: 03/23/2017 Elsevier Patient Education  2020 [employer].

## 2019-03-17 ENCOUNTER — Other Ambulatory Visit: Payer: Self-pay | Admitting: Cardiovascular Disease

## 2019-03-21 ENCOUNTER — Telehealth: Payer: Self-pay | Admitting: Cardiovascular Disease

## 2019-03-21 MED ORDER — HYDROCHLOROTHIAZIDE 25 MG PO TABS: 25.0000 mg | ORAL_TABLET | Freq: Every day | ORAL | 3 refills | Status: DC

## 2019-03-21 NOTE — Telephone Encounter (Signed)
*  STAT* If patient is at the pharmacy, call can be transferred to refill team.   1. Which medications need to be refilled? (please list name of each medication and dose if known)   hydrochlorothiazide (HYDRODIURIL) 25 MG tablet   2. Which pharmacy/location (including street and city if local pharmacy) is medication to be sent to? [place], [place] [id number]  3. Do they need a 30 day or 90 day supply? 90 day

## 2019-03-21 NOTE — Telephone Encounter (Signed)
Per pt's LOV on 02/25/18 per Dr. Acie Fredrickson pt does not need an OV for 2 years. Pt medication was sent to pt's pharmacy as requested for a year supply. Confirmation received.

## 2019-03-29 ENCOUNTER — Other Ambulatory Visit: Payer: Self-pay | Admitting: Cardiovascular Disease

## 2019-04-14 ENCOUNTER — Other Ambulatory Visit: Payer: Self-pay | Admitting: Cardiovascular Disease

## 2019-05-30 ENCOUNTER — Other Ambulatory Visit: Payer: Self-pay | Admitting: Cardiovascular Disease

## 2019-05-31 DIAGNOSIS — M8580 Other specified disorders of bone density and structure, unspecified site: Secondary | ICD-10-CM

## 2019-05-31 DIAGNOSIS — M859 Disorder of bone density and structure, unspecified: Secondary | ICD-10-CM

## 2019-06-03 ENCOUNTER — Ambulatory Visit: Payer: BC Managed Care – PPO | Attending: Internal Medicine

## 2019-06-03 DIAGNOSIS — Z23 Encounter for immunization: Secondary | ICD-10-CM

## 2019-06-03 NOTE — Progress Notes (Signed)
   Covid-19 Vaccination Clinic  Name:  ANNUM BRAUTIGAM    MRN: [id number] DOB: [birth year]  06/03/2019  Ms. Genung was observed post Covid-19 immunization for 15 minutes without incident. She was provided with Vaccine Information Sheet and instruction to access the V-Safe system.   Ms. Clunie was instructed to call 911 with any severe reactions post vaccine: Marland Kitchen Difficulty breathing  . Swelling of face and throat  . A fast heartbeat  . A bad rash all over body  . Dizziness and weakness   Immunizations Administered    Name Date Dose VIS Date Route   Moderna COVID-19 Vaccine 06/03/2019  8:23 AM 0.5 mL 02/08/2019 Intramuscular   Manufacturer: Moderna   Lot: [id number]   [place]:[id number]

## 2019-06-07 ENCOUNTER — Telehealth: Payer: Self-pay | Admitting: Internal Medicine

## 2019-06-07 NOTE — Telephone Encounter (Signed)
This conversation was on discuss via Teams chat    [9:15 AM] Tracy Chen     Hi this is regarding one of Dr Loney Laurence pt, Tracy Chen, Tracy Chen, DOB Dec 15, 1955, schedule for DEXA tomorrow. Her last DEXA was in 09/2017, so she is due for next DEXA after 09/2019 ( every after 2 yrs) . So would pl check with dr if she needs it now or she can do it after July. If she can do it in July, pl reschedule her. Thanks. ?  [9:16 AM] Tracy Chen     ok ty will ask he rreally quick ?  [9:20 AM] Tracy Chen     OK per Dr. Regis Bill pt can wait until July this will not interfere with pt health.  Thank you for catching this   Called pt to give update but no answer or vm set up.

## 2019-06-07 NOTE — Telephone Encounter (Signed)
Pauravi from [facility] stated that the pt is not due for a bone density until July and she is scheduled for it tomorrow. She would like clarity on if she needs it cause Insurance may not cover it since she is not due until July.   She can be reached at [phone]

## 2019-06-07 NOTE — Telephone Encounter (Signed)
Patient notified of update  and verbalized understanding. 

## 2019-06-08 ENCOUNTER — Inpatient Hospital Stay: Admission: RE | Admit: 2019-06-08 | Payer: BC Managed Care – PPO | Source: Ambulatory Visit

## 2019-07-06 ENCOUNTER — Ambulatory Visit: Payer: BC Managed Care – PPO | Attending: Internal Medicine

## 2019-07-06 ENCOUNTER — Other Ambulatory Visit: Payer: Self-pay

## 2019-07-06 DIAGNOSIS — Z23 Encounter for immunization: Secondary | ICD-10-CM

## 2019-07-06 NOTE — Progress Notes (Signed)
   Covid-19 Vaccination Clinic  Name:  Tracy Chen    MRN: [id number] DOB: [birth year]  07/06/2019  [name] was observed post Covid-19 immunization for 15 minutes without incident. She was provided with Vaccine Information Sheet and instruction to access the V-Safe system.   Ms. April was instructed to call 911 with any severe reactions post vaccine: Marland Kitchen Difficulty breathing  . Swelling of face and throat  . A fast heartbeat  . A bad rash all over body  . Dizziness and weakness   Immunizations Administered    Name Date Dose VIS Date Route   Moderna COVID-19 Vaccine 07/06/2019  8:49 AM 0.5 mL 02/2019 Intramuscular   Manufacturer: Moderna   Lot: [id number]   [id number]

## 2019-12-01 ENCOUNTER — Other Ambulatory Visit: Payer: Self-pay | Admitting: Internal Medicine

## 2019-12-01 DIAGNOSIS — Z1231 Encounter for screening mammogram for malignant neoplasm of breast: Secondary | ICD-10-CM

## 2019-12-12 ENCOUNTER — Other Ambulatory Visit: Payer: Self-pay | Admitting: Cardiovascular Disease

## 2019-12-12 NOTE — Telephone Encounter (Signed)
Pt's pharmacy [facility] in [place] is requesting a refill on clindamycin. Pt has not been seen since 2019.  Pt has an upcoming appt in January 2022 with Dr. Acie Fredrickson. Would Dr. Cathie Olden like to refill this medication? Please address

## 2019-12-20 ENCOUNTER — Ambulatory Visit (INDEPENDENT_AMBULATORY_CARE_PROVIDER_SITE_OTHER)
Admission: RE | Admit: 2019-12-20 | Discharge: 2019-12-20 | Disposition: A | Discharge status: Home / Self Care | Payer: BC Managed Care – PPO | Source: Ambulatory Visit | Attending: Family Medicine | Admitting: Family Medicine

## 2019-12-20 ENCOUNTER — Other Ambulatory Visit: Payer: Self-pay

## 2019-12-20 DIAGNOSIS — M8580 Other specified disorders of bone density and structure, unspecified site: Secondary | ICD-10-CM

## 2019-12-20 DIAGNOSIS — M859 Disorder of bone density and structure, unspecified: Secondary | ICD-10-CM

## 2019-12-22 ENCOUNTER — Ambulatory Visit
Admission: RE | Admit: 2019-12-22 | Discharge: 2019-12-22 | Disposition: A | Discharge status: Home / Self Care | Payer: BC Managed Care – PPO | Source: Ambulatory Visit | Attending: Internal Medicine | Admitting: Internal Medicine

## 2019-12-22 ENCOUNTER — Other Ambulatory Visit: Payer: Self-pay

## 2019-12-22 DIAGNOSIS — Z1231 Encounter for screening mammogram for malignant neoplasm of breast: Secondary | ICD-10-CM | POA: Diagnosis not present

## 2019-12-25 DIAGNOSIS — M8580 Other specified disorders of bone density and structure, unspecified site: Secondary | ICD-10-CM | POA: Diagnosis not present

## 2019-12-27 NOTE — Progress Notes (Signed)
Dexa shoes  some osteoporosis  risk   we can review and discuss at upcoming  visit in [place]

## 2020-01-20 ENCOUNTER — Encounter: Payer: BC Managed Care – PPO | Admitting: Internal Medicine

## 2020-02-29 ENCOUNTER — Other Ambulatory Visit: Payer: Self-pay | Admitting: Cardiovascular Disease

## 2020-02-29 NOTE — Telephone Encounter (Signed)
Pt's pharmacy is requesting a refill on clindamycin. Would Dr. Acie Fredrickson like to refill this medication for pt's dental appt? Please address

## 2020-03-01 MED ORDER — CLINDAMYCIN HCL 150 MG PO CAPS: ORAL_CAPSULE | ORAL | 0 refills | Status: DC

## 2020-03-01 NOTE — Telephone Encounter (Signed)
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

## 2020-03-01 NOTE — Addendum Note (Signed)
Addended by: Dollene Primrose on: 03/01/2020 02:13 PM   Modules accepted: Orders

## 2020-03-05 NOTE — Progress Notes (Signed)
Chief Complaint  Patient presents with  . Annual Exam  . Medication Management  . Hypertension    HPI: Patient  Tracy Chen  64 y.o. comes in today for Preventive Health Care visit  And med issues To review bone density is taking about 5000 units of vitamin D a day not as much exercise since November but getting back into it weightbearing. Takes blood pressure medicine not checking readings because it has been okay.  Taking medication. Follow-up with cardiology in the near future.   Health Maintenance  Topic Date Due  . HIV Screening  Never done  . COVID-19 Vaccine (3 - Booster for Moderna series) 01/05/2020  . PAP SMEAR-Modifier  01/27/2021 (Originally 02/04/2019)  . MAMMOGRAM  12/21/2021  . COLONOSCOPY (Pts 45-46yrs Insurance coverage will need to be confirmed)  05/22/2024  . TETANUS/TDAP  12/17/2028  . INFLUENZA VACCINE  Completed  . Hepatitis C Screening  Completed   Health Maintenance Review LIFESTYLE:  Exercise:  Recover from cold  Pre November walking routine .   Tobacco/ETS: no Alcohol:  no Sugar beverages:  no Sleep:  At least  7  Drug use: no HH of  2 no pets  Work: day  43 hours   And 2 [employer]     ROS:  GEN/ HEENT: No fever, significant weight changes sweats headaches vision problems hearing changes, CV/ PULM; No chest pain shortness of breath cough, syncope,edema  change in exercise tolerance. GI /GU: No adominal pain, vomiting, change in bowel habits. No blood in the stool. No significant GU symptoms. SKIN/HEME: ,no acute skin rashes suspicious lesions or bleeding. No lymphadenopathy, nodules, masses.  NEURO/ PSYCH:  No neurologic signs such as weakness numbness. No depression anxiety. IMM/ Allergy: No unusual infections.  Allergy .   REST of 12 system review negative except as per HPI   Past Medical History:  Diagnosis Date  . Allergic rhinitis   . Breast cancer ([place])    left  . Gallstone   . History of chickenpox    as a child  . 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 HX: breast cancer    hx of 2002 left er/pr 3.2cm  . Osteoporosis    L5  . Personal history of chemotherapy 2002  . Personal history of radiation therapy 2002  . 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 abd [place]     gallstones CT gallstones 1/06  . BREAST LUMPECTOMY Left 2002  . CARDIAC CATHETERIZATION N/A 05/29/2015   Procedure: Right/Left Heart Cath and Coronary Angiography;  Surgeon: Peter M Martinique, MD;  Location: [facility];  Service: Cardiovascular;  Laterality: N/A;  . COLONOSCOPY    . MITRAL VALVE REPAIR Right 06/28/2015   Procedure: MINIMALLY INVASIVE MITRAL VALVE REPAIR (MVR);  Surgeon: Rexene Alberts, MD;  Location: [facility];  Service: Open Heart Surgery;  Laterality: Right;  32 Sorin Memo 3D Ring  . PARATHYROIDECTOMY  11/06   left inferior  . partial left mastectomy     . TEE WITHOUT CARDIOVERSION N/A 04/25/2015  Procedure: TRANSESOPHAGEAL ECHOCARDIOGRAM (TEE);  Surgeon: Vesta Mixer, MD;  Location: [facility]OSCOPY;  Service: Cardiovascular;  Laterality: N/A;  . TEE WITHOUT CARDIOVERSION N/A 06/28/2015   Procedure: TRANSESOPHAGEAL ECHOCARDIOGRAM (TEE);  Surgeon: Purcell Nails, MD;  Location: [facility];  Service: Open Heart Surgery;  Laterality: N/A;    Family History  Problem Relation Age of Onset  . Osteoporosis Maternal Aunt   . Colon cancer Maternal Aunt   . Thyroid disease Other   . Heart murmur Brother        Picked up on exam followed by echo.  . Colon cancer Mother   . Valvular heart disease Father        followed      Social History   Socioeconomic History  . Marital status: Married    Spouse name: Not on file  . Number of children: Not on file  . Years of education: Not on file  . Highest education level: Not on file  Occupational History  . Not on file  Tobacco Use  . Smoking status: Never Smoker  . Smokeless tobacco: Never Used  Vaping Use  . Vaping Use: Never used  Substance and Sexual Activity  . Alcohol use: No    Alcohol/week: 0.0 standard drinks  . Drug use: No  . Sexual activity: Not on file  Other Topics Concern  . Not on file  Social History Narrative   Regular exercise- some   HH of 3     Pets outside goats and chicken.   1 cat    No ets.   Now working in [employer] office . 40 hours  Stopped second job At [employer]   26+   Neg ets   G2P2   6.5-7 hours    Neg ta caffiene .    Social Determinants of Health   Financial Resource Strain: Not on file  Food Insecurity: Not on file  Transportation Needs: Not on file  Physical Activity: Not on file  Stress: Not on file  Social Connections: Not on file    Outpatient Medications Prior to Visit  Medication Sig Dispense Refill  . Cholecalciferol (VITAMIN D3) 125 MCG (5000 UT) CAPS Take 1 capsule by mouth daily.    Marland Kitchen aspirin EC 81 MG tablet Take 1 tablet (81 mg total) by mouth daily.    . clindamycin (CLEOCIN) 150 MG capsule Take 4 capsules one hour prior to dental procedure. 4 capsule 0  . hydrochlorothiazide (HYDRODIURIL) 25 MG tablet Take 1 tablet (25 mg total) by mouth daily. 90 tablet 3  . metoprolol tartrate (LOPRESSOR) 25 MG tablet Take 1 tablet by mouth twice daily 180 tablet 3  . potassium chloride SA (KLOR-CON) 20 MEQ tablet Take 1 tablet by mouth once daily 90 tablet 3   No facility-administered medications prior to visit.     EXAM:  BP (!) 150/98 (BP Location: Left Arm, Patient Position: Sitting, Cuff Size: Normal)   Pulse 68   Temp 98.1 F (36.7 C) (Oral)   Ht 5\' 7"  (1.702 m)   Wt 162 lb 3.2 oz (73.6  kg)   SpO2 97%   BMI 25.40 kg/m   Body mass index is 25.4 kg/m. Wt Readings from Last 3 Encounters:  03/06/20 162 lb 3.2 oz (73.6 kg)  01/18/19 155 lb 9.6 oz (70.6 kg)  02/25/18 167 lb 12.8 oz (76.1 kg)    Physical Exam: Vital signs reviewed 02/27/18 is a well-developed well-nourished alert cooperative    who appearsr stated  age in no acute distress.  HEENT: normocephalic atraumatic , Eyes: PERRL EOM's full, conjunctiva clear, Nares: paten,t no deformity discharge or tenderness., Ears: no deformity EAC's clear TMs with normal landmarks. Mouthmasked NECK: supple without masses, thyromegaly or bruits. CHEST/PULM:  Clear to auscultation and percussion breath sounds equal no wheeze , rales or rhonchi. No chest wall deformities or tenderness. Breast: normal by inspection . No dimpling, discharge, masses, tenderness or discharge .  Left breast postsurgical no masses axilla clear CV: PMI is nondisplaced, S1 S2 no gallops, murmurs, rubs. Peripheral pulses are full without delay.No JVD .  ABDOMEN: Bowel sounds normal nontender  No guard or rebound, no hepato splenomegal no CVA tenderness. Extremtities:  No clubbing cyanosis or edema, no acute joint swelling or redness no focal atrophy NEURO:  Oriented x3, cranial nerves 3-12 appear to be intact, no obvious focal weakness,gait within normal limits no abnormal reflexes or asymmetrical SKIN: No acute rashes normal turgor, color, no bruising or petechiae. PSYCH: Oriented, good eye contact, no obvious depression anxiety, cognition and judgment appear normal. LN: no cervical axillary inguinal adenopathy  Lab Results  Component Value Date   WBC 7.0 01/18/2019   HGB 13.4 01/18/2019   HCT 39.8 01/18/2019   PLT 362.0 01/18/2019   GLUCOSE 89 01/18/2019   CHOL 173 01/18/2019   TRIG 102.0 01/18/2019   HDL 48.90 01/18/2019   LDLCALC 104 (H) 01/18/2019   ALT 19 01/18/2019   AST 20 01/18/2019   NA 138 01/18/2019   K 4.5 01/18/2019   CL 100  01/18/2019   CREATININE 0.70 01/18/2019   BUN 11 01/18/2019   CO2 31 01/18/2019   TSH 1.27 01/18/2019   INR 1.9 09/20/2015   HGBA1C 5.5 06/26/2015    BP Readings from Last 3 Encounters:  03/06/20 (!) 150/98  01/18/19 116/64  02/25/18 120/84    DEXA scan reviewed with patient -2.7 right femoral neck T score Negative family history of osteoporosis current fracture.  ASSESSMENT AND PLAN:  Discussed the following assessment and plan:    ICD-10-CM   1. Visit for preventive health examination  Z00.00 Basic metabolic panel    VITAMIN D 25 Hydroxy (Vit-D Deficiency, Fractures)    CBC with Differential/Platelet    Lipid panel    Hepatic function panel    TSH    TSH    Hepatic function panel    Lipid panel    CBC with Differential/Platelet    VITAMIN D 25 Hydroxy (Vit-D Deficiency, Fractures)    Basic metabolic panel    CANCELED: TSH    CANCELED: Hepatic function panel    CANCELED: Lipid panel    CANCELED: BASIC METABOLIC PANEL WITH GFR    CANCELED: CBC with Differential/Platelet    CANCELED: VITAMIN D 25 Hydroxy (Vit-D Deficiency, Fractures)  2. Essential hypertension  I10 Basic metabolic panel    VITAMIN D 25 Hydroxy (Vit-D Deficiency, Fractures)    CBC with Differential/Platelet    Lipid panel    Hepatic function panel    TSH    TSH    Hepatic function panel    Lipid panel    CBC with Differential/Platelet    VITAMIN D 25 Hydroxy (Vit-D Deficiency, Fractures)    Basic metabolic panel    CANCELED: TSH    CANCELED: Hepatic function panel    CANCELED: Lipid panel    CANCELED: BASIC METABOLIC PANEL WITH GFR    CANCELED: CBC with Differential/Platelet    CANCELED: VITAMIN D 25 Hydroxy (Vit-D Deficiency, Fractures)  Reading up in office today check readings see instructions to ensure control send in readings  3. S/P minimally invasive mitral valve repair  Z98.890 VITAMIN D 25 Hydroxy (Vit-D Deficiency, Fractures)    CBC with Differential/Platelet    Lipid panel     Hepatic function panel    TSH    TSH    Hepatic function panel    Lipid panel    CBC with Differential/Platelet    VITAMIN D 25 Hydroxy (Vit-D Deficiency, Fractures)    CANCELED: TSH    CANCELED: Hepatic function panel    CANCELED: Lipid panel    CANCELED: BASIC METABOLIC PANEL WITH GFR    CANCELED: CBC with Differential/Platelet    CANCELED: VITAMIN D 25 Hydroxy (Vit-D Deficiency, Fractures)  4. Low bone density  M85.80 VITAMIN D 25 Hydroxy (Vit-D Deficiency, Fractures)    CBC with Differential/Platelet    Lipid panel    Hepatic function panel    TSH    TSH    Hepatic function panel    Lipid panel    CBC with Differential/Platelet    VITAMIN D 25 Hydroxy (Vit-D Deficiency, Fractures)    CANCELED: TSH    CANCELED: Hepatic function panel    CANCELED: Lipid panel    CANCELED: BASIC METABOLIC PANEL WITH GFR    CANCELED: CBC with Differential/Platelet    CANCELED: VITAMIN D 25 Hydroxy (Vit-D Deficiency, Fractures)   Osteoporosis by DEXA at this time patient prefers to wait on extra medication will continue intensify lifestyle intervention repeat DEXA 2 years  5. Medication management  Z79.899 Basic metabolic panel    VITAMIN D 25 Hydroxy (Vit-D Deficiency, Fractures)    CBC with Differential/Platelet    Lipid panel    Hepatic function panel    TSH    TSH    Hepatic function panel    Lipid panel    CBC with Differential/Platelet    VITAMIN D 25 Hydroxy (Vit-D Deficiency, Fractures)    Basic metabolic panel    CANCELED: TSH    CANCELED: Hepatic function panel    CANCELED: Lipid panel    CANCELED: BASIC METABOLIC PANEL WITH GFR    CANCELED: CBC with Differential/Platelet    CANCELED: VITAMIN D 25 Hydroxy (Vit-D Deficiency, Fractures)  6. HX: breast cancer  Z85.3   taking 5000 iu per day vit d   Disc  dexa  Plan repeat in 2 years  And optimize bone health exerciseing  Low risk Pap due next year option to do today or next year should be done by next year is low  risk. Discussed Shingrix vaccine will wait to schedule on more convenient day in case she has side effects. She states she had side effects of the second Covid vaccine. Return for depending on results and bp control   yearly .  Patient Care Team: Nalaya Wojdyla, Neta Mends, MD as PCP - General Leone Payor Maryjean Morn, MD (Gastroenterology) Nahser, Deloris Ping, MD as Consulting Physician (Cardiology) Patient Instructions  Can schedule    shingrix vaccine   . Nurse visit. Will notify you  of labs when available.   BP up in  Office  Take blood pressure readings twice a day for 5-7  days and record .     Take 2 -3 readings at each sitting .   Can send in readings  by My Chart.     Before checking your blood pressure make sure: You are seated and quite for 5 min before checking Feet are flat  on the floor Siting in chair with your back supported straight up and down Arm resting on table or arm of chair at heart level Bladder is empty You have NOT had caffeine or tobacco within the last 30 min  [email or web address].[email or web address]   dexa shows   Early osteoporosis risk  Hip area  Intensify lifestyle interventions.   Weight bearing exercise  Vit d 1000 IU per day   Repeat dexa in 2 years  consider medication     If  persistent or progressive   PAP  Next year   .   Bone Health Bones protect organs, store calcium, anchor muscles, and support the whole body. Keeping your bones strong is important, especially as you get older. You can take actions to help keep your bones strong and healthy. Why is keeping my bones healthy important?  Keeping your bones healthy is important because your body constantly replaces bone cells. Cells get old, and new cells take their place. As we age, we lose bone cells because the body may not be able to make enough new cells to replace the old cells. The amount of bone cells and bone tissue you have is referred to as bone mass. The higher your bone mass, the stronger your bones. The aging process  leads to an overall loss of bone mass in the body, which can increase the likelihood of:  Joint pain and stiffness.  Broken bones.  A condition in which the bones become weak and brittle (osteoporosis). A large decline in bone mass occurs in older adults. In women, it occurs about the time of menopause. What actions can I take to keep my bones healthy? Good health habits are important for maintaining healthy bones. This includes eating nutritious foods and exercising regularly. To have healthy bones, you need to get enough of the right minerals and vitamins. Most nutrition experts recommend getting these nutrients from the foods that you eat. In some cases, taking supplements may also be recommended. Doing certain types of exercise is also important for bone health. What are the nutritional recommendations for healthy bones?  Eating a well-balanced diet with plenty of calcium and vitamin D will help to protect your bones. Nutritional recommendations vary from person to person. Ask your health care provider what is healthy for you. Here are some general guidelines. Get enough calcium Calcium is the most important (essential) mineral for bone health. Most people can get enough calcium from their diet, but supplements may be recommended for people who are at risk for osteoporosis. Good sources of calcium include:  Dairy products, such as low-fat or nonfat milk, cheese, and yogurt.  Dark green leafy vegetables, such as bok choy and broccoli.  Calcium-fortified foods, such as orange juice, cereal, bread, soy beverages, and tofu products.  Nuts, such as almonds. Follow these recommended amounts for daily calcium intake:  Children, age 74-3: 700 mg.  Children, age 33-8: 1,000 mg.  Children, age 649-13: 1,300 mg.  Teens, age 62-18: 1,300 mg.  Adults, age 62-50: 1,000 mg.  Adults, age 62-70: ? Men: 1,000 mg. ? Women: 1,200 mg.  Adults, age 74271 or older: 1,200 mg.  Pregnant and  breastfeeding females: ? Teens: 1,300 mg. ? Adults: 1,000 mg. Get enough vitamin D Vitamin D is the most essential vitamin for bone health. It helps the body absorb calcium. Sunlight stimulates the skin to make vitamin D, so be sure to get enough sunlight. If you live in a cold climate or you do not get  outside often, your health care provider may recommend that you take vitamin D supplements. Good sources of vitamin D in your diet include:  Egg yolks.  Saltwater fish.  Milk and cereal fortified with vitamin D. Follow these recommended amounts for daily vitamin D intake:  Children and teens, age 6-18: 600 international units.  Adults, age 28 or younger: 400-800 international units.  Adults, age 31 or older: 800-1,000 international units. Get other important nutrients Other nutrients that are important for bone health include:  Phosphorus. This mineral is found in meat, poultry, dairy foods, nuts, and legumes. The recommended daily intake for adult men and adult women is 700 mg.  Magnesium. This mineral is found in seeds, nuts, dark green vegetables, and legumes. The recommended daily intake for adult men is 400-420 mg. For adult women, it is 310-320 mg.  Vitamin K. This vitamin is found in green leafy vegetables. The recommended daily intake is 120 mg for adult men and 90 mg for adult women. What type of physical activity is best for building and maintaining healthy bones? Weight-bearing and strength-building activities are important for building and maintaining healthy bones. Weight-bearing activities cause muscles and bones to work against gravity. Strength-building activities increase the strength of the muscles that support bones. Weight-bearing and muscle-building activities include:  Walking and hiking.  Jogging and running.  Dancing.  Gym exercises.  Lifting weights.  Tennis and racquetball.  Climbing stairs.  Aerobics. Adults should get at least 30 minutes of  moderate physical activity on most days. Children should get at least 60 minutes of moderate physical activity on most days. Ask your health care provider what type of exercise is best for you. How can I find out if my bone mass is low? Bone mass can be measured with an X-ray test called a bone mineral density (BMD) test. This test is recommended for all women who are age 51 or older. It may also be recommended for:  Men who are age 90 or older.  People who are at risk for osteoporosis because of: ? Having bones that break easily. ? Having a long-term disease that weakens bones, such as kidney disease or rheumatoid arthritis. ? Having menopause earlier than normal. ? Taking medicine that weakens bones, such as steroids, thyroid hormones, or hormone treatment for breast cancer or prostate cancer. ? Smoking. ? Drinking three or more alcoholic drinks a day. If you find that you have a low bone mass, you may be able to prevent osteoporosis or further bone loss by changing your diet and lifestyle. Where can I find more information? For more information, check out the following websites:  [place]: AviationTales.fr  [employer] of Health: www.bones.SouthExposed.es  International Osteoporosis Foundation: Administrator.iofbonehealth.org Summary  The aging process leads to an overall loss of bone mass in the body, which can increase the likelihood of broken bones and osteoporosis.  Eating a well-balanced diet with plenty of calcium and vitamin D will help to protect your bones.  Weight-bearing and strength-building activities are also important for building and maintaining strong bones.  Bone mass can be measured with an X-ray test called a bone mineral density (BMD) test. This information is not intended to replace advice given to you by your health care provider. Make sure you discuss any questions you have with your health care provider. Document Revised: 03/23/2017  Document Reviewed: 03/23/2017 Elsevier Patient Education  2020 [place] Lynell Greenhouse M.D.

## 2020-03-06 ENCOUNTER — Encounter: Payer: Self-pay | Admitting: Internal Medicine

## 2020-03-06 ENCOUNTER — Other Ambulatory Visit: Payer: Self-pay

## 2020-03-06 ENCOUNTER — Ambulatory Visit (INDEPENDENT_AMBULATORY_CARE_PROVIDER_SITE_OTHER): Payer: BC Managed Care – PPO | Admitting: Internal Medicine

## 2020-03-06 VITALS — BP 150/98 | HR 68 | Temp 98.1°F | Ht 67.0 in | Wt 162.2 lb

## 2020-03-06 DIAGNOSIS — Z9889 Other specified postprocedural states: Secondary | ICD-10-CM

## 2020-03-06 DIAGNOSIS — Z79899 Other long term (current) drug therapy: Secondary | ICD-10-CM | POA: Diagnosis not present

## 2020-03-06 DIAGNOSIS — M8580 Other specified disorders of bone density and structure, unspecified site: Secondary | ICD-10-CM

## 2020-03-06 DIAGNOSIS — I10 Essential (primary) hypertension: Secondary | ICD-10-CM | POA: Diagnosis not present

## 2020-03-06 DIAGNOSIS — M859 Disorder of bone density and structure, unspecified: Secondary | ICD-10-CM

## 2020-03-06 DIAGNOSIS — Z0000 Encounter for general adult medical examination without abnormal findings: Secondary | ICD-10-CM

## 2020-03-06 DIAGNOSIS — Z853 Personal history of malignant neoplasm of breast: Secondary | ICD-10-CM

## 2020-03-06 LAB — BASIC METABOLIC PANEL
BUN: 12 mg/dL (ref 6–23)
CO2: 32 mEq/L (ref 19–32)
Calcium: 10.1 mg/dL (ref 8.4–10.5)
Chloride: 100 mEq/L (ref 96–112)
Creatinine, Ser: 0.69 mg/dL (ref 0.40–1.20)
GFR: 92.72 mL/min (ref >60.00)
Glucose, Bld: 90 mg/dL (ref 70–99)
Potassium: 4.2 mEq/L (ref 3.5–5.1)
Sodium: 138 mEq/L (ref 135–145)

## 2020-03-06 LAB — CBC WITH DIFFERENTIAL/PLATELET
Basophils Absolute: 0 10*3/uL (ref 0.0–0.1)
Basophils Relative: 0.3 % (ref 0.0–3.0)
Eosinophils Absolute: 0.2 10*3/uL (ref 0.0–0.7)
Eosinophils Relative: 2.4 % (ref 0.0–5.0)
HCT: 39.2 % (ref 36.0–46.0)
Hemoglobin: 13.2 g/dL (ref 12.0–15.0)
Lymphocytes Relative: 27.7 % (ref 12.0–46.0)
Lymphs Abs: 1.8 10*3/uL (ref 0.7–4.0)
MCHC: 33.6 g/dL (ref 30.0–36.0)
MCV: 87.3 fl (ref 78.0–100.0)
Monocytes Absolute: 0.6 10*3/uL (ref 0.1–1.0)
Monocytes Relative: 8.8 % (ref 3.0–12.0)
Neutro Abs: 3.9 10*3/uL (ref 1.4–7.7)
Neutrophils Relative %: 60.8 % (ref 43.0–77.0)
Platelets: 352 10*3/uL (ref 150.0–400.0)
RBC: 4.5 Mil/uL (ref 3.87–5.11)
RDW: 13.9 % (ref 11.5–15.5)
WBC: 6.4 10*3/uL (ref 4.0–10.5)

## 2020-03-06 LAB — LIPID PANEL
Cholesterol: 178 mg/dL (ref 0–200)
HDL: 42.6 mg/dL (ref >39.00)
LDL Cholesterol: 96 mg/dL (ref 0–99)
NonHDL: 135.62
Total CHOL/HDL Ratio: 4
Triglycerides: 200 mg/dL — ABNORMAL HIGH (ref 0.0–149.0)
VLDL: 40 mg/dL (ref 0.0–40.0)

## 2020-03-06 LAB — HEPATIC FUNCTION PANEL
ALT: 23 U/L (ref 0–35)
AST: 21 U/L (ref 0–37)
Albumin: 4.4 g/dL (ref 3.5–5.2)
Alkaline Phosphatase: 89 U/L (ref 39–117)
Bilirubin, Direct: 0.1 mg/dL (ref 0.0–0.3)
Total Bilirubin: 0.5 mg/dL (ref 0.2–1.2)
Total Protein: 7.1 g/dL (ref 6.0–8.3)

## 2020-03-06 LAB — VITAMIN D 25 HYDROXY (VIT D DEFICIENCY, FRACTURES): VITD: 64.18 ng/mL (ref 30.00–100.00)

## 2020-03-06 LAB — TSH: TSH: 1.04 u[IU]/mL (ref 0.35–4.50)

## 2020-03-06 NOTE — Progress Notes (Signed)
Vitamin D level is good as well as liver thyroid chemistry and blood count.  Cholesterol okay triglycerides are up slightly

## 2020-03-06 NOTE — Patient Instructions (Addendum)
Can schedule    shingrix vaccine   . Nurse visit. Will notify you  of labs when available.   BP up in  Office  Take blood pressure readings twice a day for 5-7  days and record .     Take 2 -3 readings at each sitting .   Can send in readings  by My Chart.     Before checking your blood pressure make sure: You are seated and quite for 5 min before checking Feet are flat on the floor Siting in chair with your back supported straight up and down Arm resting on table or arm of chair at heart level Bladder is empty You have NOT had caffeine or tobacco within the last 30 min  [email or web address]   dexa shows   Early osteoporosis risk  Hip area  Intensify lifestyle interventions.   Weight bearing exercise  Vit d 1000 IU per day   Repeat dexa in 2 years  consider medication     If  persistent or progressive   PAP  Next year   .   Bone Health Bones protect organs, store calcium, anchor muscles, and support the whole body. Keeping your bones strong is important, especially as you get older. You can take actions to help keep your bones strong and healthy. Why is keeping my bones healthy important?  Keeping your bones healthy is important because your body constantly replaces bone cells. Cells get old, and new cells take their place. As we age, we lose bone cells because the body may not be able to make enough new cells to replace the old cells. The amount of bone cells and bone tissue you have is referred to as bone mass. The higher your bone mass, the stronger your bones. The aging process leads to an overall loss of bone mass in the body, which can increase the likelihood of:  Joint pain and stiffness.  Broken bones.  A condition in which the bones become weak and brittle (osteoporosis). A large decline in bone mass occurs in older adults. In women, it occurs about the time of menopause. What actions can I take to keep my bones healthy? Good health habits are important for maintaining  healthy bones. This includes eating nutritious foods and exercising regularly. To have healthy bones, you need to get enough of the right minerals and vitamins. Most nutrition experts recommend getting these nutrients from the foods that you eat. In some cases, taking supplements may also be recommended. Doing certain types of exercise is also important for bone health. What are the nutritional recommendations for healthy bones?  Eating a well-balanced diet with plenty of calcium and vitamin D will help to protect your bones. Nutritional recommendations vary from person to person. Ask your health care provider what is healthy for you. Here are some general guidelines. Get enough calcium Calcium is the most important (essential) mineral for bone health. Most people can get enough calcium from their diet, but supplements may be recommended for people who are at risk for osteoporosis. Good sources of calcium include:  Dairy products, such as low-fat or nonfat milk, cheese, and yogurt.  Dark green leafy vegetables, such as bok choy and broccoli.  Calcium-fortified foods, such as orange juice, cereal, bread, soy beverages, and tofu products.  Nuts, such as almonds. Follow these recommended amounts for daily calcium intake:  Children, age 15-3: 700 mg.  Children, age 40-8: 1,000 mg.  Children, age 58-13: 1,300 mg.  Teens, age 29-18: 36,300  mg.  Adults, age 85-50: 1,000 mg.  Adults, age 33-70: ? Men: 1,000 mg. ? Women: 1,200 mg.  Adults, age 58 or older: 1,200 mg.  Pregnant and breastfeeding females: ? Teens: 1,300 mg. ? Adults: 1,000 mg. Get enough vitamin D Vitamin D is the most essential vitamin for bone health. It helps the body absorb calcium. Sunlight stimulates the skin to make vitamin D, so be sure to get enough sunlight. If you live in a cold climate or you do not get outside often, your health care provider may recommend that you take vitamin D supplements. Good sources of vitamin  D in your diet include:  Egg yolks.  Saltwater fish.  Milk and cereal fortified with vitamin D. Follow these recommended amounts for daily vitamin D intake:  Children and teens, age 46-18: 600 international units.  Adults, age 26 or younger: 400-800 international units.  Adults, age 46 or older: 800-1,000 international units. Get other important nutrients Other nutrients that are important for bone health include:  Phosphorus. This mineral is found in meat, poultry, dairy foods, nuts, and legumes. The recommended daily intake for adult men and adult women is 700 mg.  Magnesium. This mineral is found in seeds, nuts, dark green vegetables, and legumes. The recommended daily intake for adult men is 400-420 mg. For adult women, it is 310-320 mg.  Vitamin K. This vitamin is found in green leafy vegetables. The recommended daily intake is 120 mg for adult men and 90 mg for adult women. What type of physical activity is best for building and maintaining healthy bones? Weight-bearing and strength-building activities are important for building and maintaining healthy bones. Weight-bearing activities cause muscles and bones to work against gravity. Strength-building activities increase the strength of the muscles that support bones. Weight-bearing and muscle-building activities include:  Walking and hiking.  Jogging and running.  Dancing.  Gym exercises.  Lifting weights.  Tennis and racquetball.  Climbing stairs.  Aerobics. Adults should get at least 30 minutes of moderate physical activity on most days. Children should get at least 60 minutes of moderate physical activity on most days. Ask your health care provider what type of exercise is best for you. How can I find out if my bone mass is low? Bone mass can be measured with an X-ray test called a bone mineral density (BMD) test. This test is recommended for all women who are age 35 or older. It may also be recommended for:  Men  who are age 22 or older.  People who are at risk for osteoporosis because of: ? Having bones that break easily. ? Having a long-term disease that weakens bones, such as kidney disease or rheumatoid arthritis. ? Having menopause earlier than normal. ? Taking medicine that weakens bones, such as steroids, thyroid hormones, or hormone treatment for breast cancer or prostate cancer. ? Smoking. ? Drinking three or more alcoholic drinks a day. If you find that you have a low bone mass, you may be able to prevent osteoporosis or further bone loss by changing your diet and lifestyle. Where can I find more information? For more information, check out the following websites:  National Osteoporosis Foundation: https://carlson-fletcher.info/  [employer]: www.bones.http://www.myers.net/  International Osteoporosis Foundation: Investment banker, operational.iofbonehealth.org Summary  The aging process leads to an overall loss of bone mass in the body, which can increase the likelihood of broken bones and osteoporosis.  Eating a well-balanced diet with plenty of calcium and vitamin D will help to protect your bones.  Weight-bearing and strength-building activities are also important for building and maintaining strong bones.  Bone mass can be measured with an X-ray test called a bone mineral density (BMD) test. This information is not intended to replace advice given to you by your health care provider. Make sure you discuss any questions you have with your health care provider. Document Revised: 03/23/2017 Document Reviewed: 03/23/2017 Elsevier Patient Education  2020 [employer].

## 2020-03-11 ENCOUNTER — Encounter: Payer: Self-pay | Admitting: Cardiovascular Disease

## 2020-03-11 NOTE — Progress Notes (Signed)
Cardiology Office Note   Date:  03/13/2020   ID:  Tracy Chen, DOB [birth year], MRN [id number]  PCP:  Burnis Medin, MD  Cardiologist:   Mertie Moores, MD   Chief Complaint  Patient presents with  . Mitral Valve Prolapse   Problem List 1. 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 MV repair , June 28, 2015 3. Breast cancer . Left breast, s/p chemo and XRT   Tracy Chen is a 65 y.o. female who presents for evaluation of a heart murmur. She was recently found have a heart murmur on exam. She had an echo card gram which revealed mitral valve prolapse involving the posterior leaflet. She has associated severe mitral regurgitation.   no hx of murmur in the past.  No CP or dyspnea.   Exercises regularly without problems.  No limitations  August 13, 2015:  Tracy Chen is seen today for follow up of her MV repair ( June 28, 2015)  Healing up .  Feeling some better Trying to exercise.    HR is fast  Has a "white cloud" over her left eye for a few minutes over Memorial day  No other neurologic deficits.    September 27, 2015:    Doing great.  S/p MV repair. Did not Take her metoprolol this point. Her heart rate is a little elevated as result.  Sept. 24, 2018:  She saw Richardson Dopp at her last visit . She continues to do well. Follow-up echocardiogram in August, 2018 revealed normal left ventricle systolic function.  Dec. 19, 2019: Doing well  No cp or dyspnea  Not exercising much    Jan. 4, 2022:  Tracy Chen is seen today for follow up of her mitral valve repair. Getting some exercise   Takes SBE prophylaxis prior to dental work    Past Medical History:  Diagnosis Date  . Allergic rhinitis   . Breast cancer ([place])    left  . Gallstone   . History of chickenpox    as a child  . 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 HX: breast cancer    hx of 2002 left  er/pr 3.2cm  . Osteoporosis    L5  . Personal history of chemotherapy 2002  . Personal history of radiation therapy 2002  . 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 abd [place]     gallstones CT gallstones 1/06  . BREAST LUMPECTOMY Left 2002  . CARDIAC CATHETERIZATION N/A 05/29/2015   Procedure: Right/Left Heart Cath and Coronary Angiography;  Surgeon: Peter M Martinique, MD;  Location: [facility];  Service: Cardiovascular;  Laterality: N/A;  . COLONOSCOPY    . MITRAL VALVE REPAIR Right 06/28/2015   Procedure: MINIMALLY INVASIVE MITRAL VALVE REPAIR (MVR);  Surgeon: Rexene Alberts, MD;  Location: [facility];  Service: Open Heart Surgery;  Laterality: Right;  32 Sorin Memo 3D Ring  . PARATHYROIDECTOMY  11/06   left inferior  . partial left mastectomy     . TEE WITHOUT CARDIOVERSION N/A 04/25/2015   Procedure: TRANSESOPHAGEAL ECHOCARDIOGRAM (TEE);  Surgeon: Arnette Norris  Deboraha Sprang, MD;  Location: [facility];  Service: Cardiovascular;  Laterality: N/A;  . TEE WITHOUT CARDIOVERSION N/A 06/28/2015   Procedure: TRANSESOPHAGEAL ECHOCARDIOGRAM (TEE);  Surgeon: Rexene Alberts, MD;  Location: [facility];  Service: Open Heart Surgery;  Laterality: N/A;     Current Outpatient Medications  Medication Sig Dispense Refill  . aspirin EC 81 MG tablet Take 1 tablet (81 mg total) by mouth daily.    . Cholecalciferol (VITAMIN D3) 125 MCG (5000 UT) CAPS Take 1 capsule by mouth daily.    . hydrochlorothiazide (HYDRODIURIL) 25 MG tablet Take 1 tablet (25 mg total) by mouth daily. 90 tablet 3  . metoprolol tartrate (LOPRESSOR) 25 MG tablet Take 1 tablet by mouth twice daily 180 tablet 3  . potassium chloride SA (KLOR-CON) 20 MEQ tablet Take 1  tablet by mouth once daily 90 tablet 3  . clindamycin (CLEOCIN) 150 MG capsule Take 4 capsules one hour prior to all dental visits. 8 capsule 12   No current facility-administered medications for this visit.    Allergies:   Penicillins    Social History:  The patient  reports that she has never smoked. She has never used smokeless tobacco. She reports that she does not drink alcohol and does not use drugs.   Family History:  The patient's family history includes Colon cancer in her maternal aunt and mother; Heart murmur in her brother; Osteoporosis in her maternal aunt; Thyroid disease in an other family member; Valvular heart disease in her father.    ROS:  Please see the history of present illness.    Physical Exam: Blood pressure 132/82, pulse 69, height 5\' 7"  (1.702 m), weight 162 lb (73.5 kg), SpO2 95 %.  GEN:  Well nourished, well developed in no acute distress HEENT: Normal NECK: No JVD; No carotid bruits LYMPHATICS: No lymphadenopathy CARDIAC: RRR   RESPIRATORY:  Clear to auscultation without rales, wheezing or rhonchi  ABDOMEN: Soft, non-tender, non-distended MUSCULOSKELETAL:  No edema; No deformity  SKIN: Warm and dry NEUROLOGIC:  Alert and oriented x 3    EKG:   Jan. 4, 2022:   NSR . No ST or T abn.      Recent Labs: 03/06/2020: ALT 23; BUN 12; Creatinine, Ser 0.69; Hemoglobin 13.2; Platelets 352.0; Potassium 4.2; Sodium 138; TSH 1.04    Lipid Panel    Component Value Date/Time   CHOL 178 03/06/2020 1115   TRIG 200.0 (H) 03/06/2020 1115   TRIG 62 12/26/2005 1000   HDL 42.60 03/06/2020 1115   CHOLHDL 4 03/06/2020 1115   VLDL 40.0 03/06/2020 1115   LDLCALC 96 03/06/2020 1115      Wt Readings from Last 3 Encounters:  03/13/20 162 lb (73.5 kg)  03/06/20 162 lb 3.2 oz (73.6 kg)  01/18/19 155 lb 9.6 oz (70.6 kg)      Other studies Reviewed: Additional studies/ records that were reviewed today include: . Review of the above records demonstrates:     ASSESSMENT AND PLAN:  1.  MVP, / MR S/p MV repair.  Doing well cont SBE prophylaxis She takes clindamycin.  Ok to change to another if she has any GI issues      2. Tachycardia -  Stable,     3.  HTN:    BP is stable   Return in 2 years.  Have advised her to exercise    Current medicines are reviewed at length with the patient today.  The patient does not have concerns regarding  medicines.  The following changes have been made:  no change  Labs/ tests ordered today include:   Orders Placed This Encounter  Procedures  . EKG 12-Lead     Kristeen Miss, MD  03/13/2020 2:37 PM    [facility] [address] [place], [place], [place]  [address] Phone: [phone]; Fax: [phone]

## 2020-03-13 ENCOUNTER — Ambulatory Visit (INDEPENDENT_AMBULATORY_CARE_PROVIDER_SITE_OTHER): Payer: BC Managed Care – PPO | Admitting: Cardiovascular Disease

## 2020-03-13 ENCOUNTER — Other Ambulatory Visit: Payer: Self-pay

## 2020-03-13 ENCOUNTER — Encounter: Payer: Self-pay | Admitting: Cardiovascular Disease

## 2020-03-13 VITALS — BP 132/82 | HR 69 | Ht 67.0 in | Wt 162.0 lb

## 2020-03-13 DIAGNOSIS — I341 Nonrheumatic mitral (valve) prolapse: Secondary | ICD-10-CM

## 2020-03-13 DIAGNOSIS — Z9889 Other specified postprocedural states: Secondary | ICD-10-CM | POA: Diagnosis not present

## 2020-03-13 MED ORDER — CLINDAMYCIN HCL 150 MG PO CAPS: ORAL_CAPSULE | ORAL | 12 refills | Status: DC

## 2020-03-13 NOTE — Patient Instructions (Signed)
Medication Instructions:  Your provider recommends that you continue on your current medications as directed. Please refer to the Current Medication list given to you today.   *If you need a refill on your cardiac medications before your next appointment, please call your pharmacy*  Follow-Up: At [facility], you and your health needs are our priority.  As part of our continuing mission to provide you with exceptional heart care, we have created designated Provider Care Teams.  These Care Teams include your primary Cardiologist (physician) and Advanced Practice Providers (APPs -  Physician Assistants and Nurse Practitioners) who all work together to provide you with the care you need, when you need it. Your next appointment:   2 year(s) The format for your next appointment:   In Person Provider:   You may see Dr. Elease Hashimoto or one of the following Advanced Practice Providers on your designated Care Team:    Tereso Newcomer, PA-C  [place], [place]

## 2020-03-27 NOTE — Telephone Encounter (Signed)
Thanks   Yes send in  More readings

## 2020-04-06 ENCOUNTER — Other Ambulatory Visit: Payer: Self-pay | Admitting: Cardiovascular Disease

## 2020-05-09 ENCOUNTER — Other Ambulatory Visit: Payer: Self-pay | Admitting: Cardiovascular Disease

## 2020-05-31 ENCOUNTER — Encounter: Payer: Self-pay | Admitting: Internal Medicine

## 2020-06-12 ENCOUNTER — Other Ambulatory Visit: Payer: Self-pay | Admitting: Cardiovascular Disease

## 2020-06-13 ENCOUNTER — Other Ambulatory Visit: Payer: Self-pay | Admitting: *Deleted

## 2020-06-13 MED ORDER — METOPROLOL TARTRATE 25 MG PO TABS
25.0000 mg | ORAL_TABLET | Freq: Two times a day (BID) | ORAL | 3 refills | Status: DC
Start: 2020-06-13 — End: 2021-04-19

## 2021-01-12 ENCOUNTER — Other Ambulatory Visit: Payer: Self-pay | Admitting: Cardiovascular Disease

## 2021-03-28 ENCOUNTER — Other Ambulatory Visit: Payer: Self-pay | Admitting: Cardiovascular Disease

## 2021-04-16 ENCOUNTER — Other Ambulatory Visit: Payer: Self-pay

## 2021-04-16 MED ORDER — CLINDAMYCIN HCL 150 MG PO CAPS: ORAL_CAPSULE | ORAL | 3 refills | Status: DC

## 2021-04-16 NOTE — Telephone Encounter (Signed)
Per last office visit with Dr. Acie Fredrickson on 04/02/20, pt does not have to see Dr. Acie Fredrickson for 2 years, 04/02/22. Pt's medication was sent to pt's pharmacy as requested. Confirmation received.

## 2021-04-19 ENCOUNTER — Other Ambulatory Visit: Payer: Self-pay | Admitting: *Deleted

## 2021-04-19 MED ORDER — METOPROLOL TARTRATE 25 MG PO TABS: 25.0000 mg | ORAL_TABLET | Freq: Two times a day (BID) | ORAL | 3 refills | Status: DC

## 2021-04-22 ENCOUNTER — Other Ambulatory Visit: Payer: Self-pay | Admitting: *Deleted

## 2021-04-22 MED ORDER — POTASSIUM CHLORIDE CRYS ER 20 MEQ PO TBCR: 20.0000 meq | EXTENDED_RELEASE_TABLET | Freq: Every day | ORAL | 0 refills | Status: DC

## 2021-05-21 ENCOUNTER — Telehealth: Payer: Self-pay

## 2021-05-21 NOTE — Telephone Encounter (Signed)
LVM instructions for pt to call office to schedule CPE. ?

## 2021-06-11 ENCOUNTER — Other Ambulatory Visit: Payer: Self-pay | Admitting: Internal Medicine

## 2021-06-11 DIAGNOSIS — Z1231 Encounter for screening mammogram for malignant neoplasm of breast: Secondary | ICD-10-CM

## 2021-06-19 ENCOUNTER — Ambulatory Visit
Admission: RE | Admit: 2021-06-19 | Discharge: 2021-06-19 | Disposition: A | Discharge status: Home / Self Care | Payer: BC Managed Care – PPO | Source: Ambulatory Visit | Attending: Internal Medicine | Admitting: Internal Medicine

## 2021-06-19 DIAGNOSIS — Z1231 Encounter for screening mammogram for malignant neoplasm of breast: Secondary | ICD-10-CM

## 2021-06-21 ENCOUNTER — Other Ambulatory Visit: Payer: Self-pay | Admitting: Internal Medicine

## 2021-06-21 DIAGNOSIS — R928 Other abnormal and inconclusive findings on diagnostic imaging of breast: Secondary | ICD-10-CM

## 2021-07-01 ENCOUNTER — Ambulatory Visit
Admission: RE | Admit: 2021-07-01 | Discharge: 2021-07-01 | Disposition: A | Discharge status: Home / Self Care | Payer: BC Managed Care – PPO | Source: Ambulatory Visit | Attending: Internal Medicine | Admitting: Internal Medicine

## 2021-07-01 DIAGNOSIS — N6315 Unspecified lump in the right breast, overlapping quadrants: Secondary | ICD-10-CM | POA: Diagnosis not present

## 2021-07-01 DIAGNOSIS — R928 Other abnormal and inconclusive findings on diagnostic imaging of breast: Secondary | ICD-10-CM

## 2021-07-07 NOTE — Progress Notes (Signed)
? ?Chief Complaint  ?Patient presents with  ? Annual Exam  ? ? ?HPI: ?Patient  Tracy Chen  66 y.o. comes in today for Preventive Health Care visit  ?Last cpx 12 21  ? Due for pap  no hx abnormal  last was 2017 ?Had mammo and call back but [place] was  ok androutine follow up.  ?Sees cards every 2 years now  no sx  ? ?Health Maintenance  ?Topic Date Due  ? PAP SMEAR-Modifier  02/04/2019  ? COVID-19 Vaccine (3 - Moderna risk series) 07/24/2021 (Originally 08/03/2019)  ? HIV Screening  07/09/2022 (Originally 04/23/1970)  ? INFLUENZA VACCINE  10/08/2021  ? MAMMOGRAM  06/20/2023  ? COLONOSCOPY (Pts 45-81yr Insurance coverage will need to be confirmed)  05/22/2024  ? TETANUS/TDAP  12/17/2028  ? Hepatitis C Screening  Completed  ? Zoster Vaccines- Shingrix  Completed  ? HPV VACCINES  Aged Out  ? ?Health Maintenance Review ?LIFESTYLE:  ?Exercise:  yes  3 x per week  ?Tobacco/ETS: no ?Alcohol:   no ?Sugar beverages: ?Sleep: 8  ?Drug use: no ?HH of   2  no pets  ?Work: 40 hours  in person.  Less stress ful  job.  Doing well  ?Sess cards every 2 year .  ? ? ?ROS:  ?GEN/ HEENT: No fever, significant weight changes sweats headaches vision problems hearing changes, ?CV/ PULM; No chest pain shortness of breath cough, syncope,edema  change in exercise tolerance. ?GI /GU: No adominal pain, vomiting, change in bowel habits. No blood in the stool. No significant GU symptoms. ?SKIN/HEME: ,no acute skin rashes suspicious lesions or bleeding. No lymphadenopathy, nodules, masses.  ?NEURO/ PSYCH:  No neurologic signs such as weakness numbness. No depression anxiety. ?IMM/ Allergy: No unusual infections.  Allergy .   ?REST of 12 system review negative except as per HPI ? ? ?Past Medical History:  ?Diagnosis Date  ? Allergic rhinitis   ? [place]   ? [place]  ? Gallstone   ? History of chickenpox   ? as a child  ? Hx of valvular heart disease 02/21/2015  ? MVP with MR >> s/p min inv MV repair // Echo 8/18: EF 50-55, s/p MV repair with  mild residual MR, mod to severe LAE, mild RAE  ? HX: breast cancer   ? hx of 2002 left er/pr 3.2cm  ? Osteoporosis   ? L5  ? Personal history of chemotherapy 2002  ? Personal history of radiation therapy 2002  ? S/P minimally invasive mitral valve repair 06/28/2015  ? Complex valvuloplasty including triangular resection of flail segment of posterior leaflet, artificial Gore-tex neochord placement x4 and 32 mm Sorin Memo 3D ring annuloplasty via right mini-thoracotomy incision  ? ? ?Past Surgical History:  ?Procedure Laterality Date  ? abd [place]   ? gallstones CT gallstones 1/06  ? BREAST LUMPECTOMY Left 2002  ? CARDIAC CATHETERIZATION N/A 05/29/2015  ? Procedure: Right/Left Heart Cath and Coronary Angiography;  Surgeon: Peter M JMartinique MD;  [facility];  Service: Cardiovascular;  Laterality: N/A;  ? COLONOSCOPY    ? MITRAL VALVE REPAIR Right 06/28/2015  ? Procedure: MINIMALLY INVASIVE MITRAL VALVE REPAIR (MVR);  Surgeon: CRexene Alberts MD;  [place] [facility]  Service: Open Heart Surgery;  Laterality: Right;  32 Sorin Memo 3D Ring  ? PARATHYROIDECTOMY  11/06  ? left inferior  ? partial left mastectomy     ? TEE WITHOUT CARDIOVERSION N/A 04/25/2015  ? Procedure:  TRANSESOPHAGEAL ECHOCARDIOGRAM (TEE);  Surgeon: Thayer Headings, MD;  Location: [facility];  Service: Cardiovascular;  Laterality: N/A;  ? TEE WITHOUT CARDIOVERSION N/A 06/28/2015  ? Procedure: TRANSESOPHAGEAL ECHOCARDIOGRAM (TEE);  Surgeon: Rexene Alberts, MD;  Location: [facility];  Service: Open Heart Surgery;  Laterality: N/A;  ? ? ?Family History  ?Problem Relation Age of Onset  ? Osteoporosis Maternal Aunt   ? Colon cancer Maternal Aunt   ? Thyroid disease Other   ? Heart murmur Brother   ?     Picked up on exam followed by echo.  ? Colon cancer Mother   ? Valvular heart disease Father   ?     followed   ? ? ?Social History  ? ?Socioeconomic History  ? Marital status: Married  ?  Spouse name: Not on file  ? Number of children: Not on file  ?  Years of education: Not on file  ? Highest education level: Not on file  ?Occupational History  ? Not on file  ?Tobacco Use  ? Smoking status: Never  ? Smokeless tobacco: Never  ?Vaping Use  ? Vaping Use: Never used  ?Substance and Sexual Activity  ? Alcohol use: No  ?  Alcohol/week: 0.0 standard drinks  ? Drug use: No  ? Sexual activity: Not on file  ?Other Topics Concern  ? Not on file  ?Social History Narrative  ? Regular exercise- some  ? HH of 3    ? Pets outside goats and chicken.   1 cat   ? No ets.  ? 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 Neg ets  ? G2P2  ? 6.5-7 hours   ? Neg ta caffiene .   ? ?Social Determinants of Health  ? ?Financial Resource Strain: Not on file  ?Food Insecurity: Not on file  ?Transportation Needs: Not on file  ?Physical Activity: Not on file  ?Stress: Not on file  ?Social Connections: Not on file  ? ? ?Outpatient Medications Prior to Visit  ?Medication Sig Dispense Refill  ? aspirin EC 81 MG tablet Take 1 tablet (81 mg total) by mouth daily.    ? Cholecalciferol (VITAMIN D3) 125 MCG (5000 UT) CAPS Take 1 capsule by mouth daily.    ? clindamycin (CLEOCIN) 150 MG capsule Take 4 capsules one hour prior to all dental visits. 8 capsule 3  ? hydrochlorothiazide (HYDRODIURIL) 25 MG tablet Take 1 tablet by mouth once daily 90 tablet 3  ? metoprolol tartrate (LOPRESSOR) 25 MG tablet Take 1 tablet (25 mg total) by mouth 2 (two) times daily. 180 tablet 3  ? potassium chloride SA (KLOR-CON M) 20 MEQ tablet Take 1 tablet (20 mEq total) by mouth daily. 30 tablet 0  ? ?No facility-administered medications prior to visit.  ? ? ? ?EXAM: ? ?BP 120/78   Pulse 75   Temp 98.2 ?F (36.8 ?C) (Oral)   Ht '5\' 6"'$  (1.676 m)   Wt 162 lb (73.5 kg)   SpO2 96%   BMI 26.15 kg/m?  ? ?Body mass index is 26.15 kg/m?. ?Wt Readings from Last 3 Encounters:  ?07/08/21 162 lb (73.5 kg)  ?03/13/20 162 lb (73.5 kg)  ?03/06/20 162 lb 3.2 oz (73.6 kg)  ? ? ?Physical Exam: ?Vital  signs reviewed ?HYQ:MVHQ is a well-developed well-nourished alert cooperative    who appearsr stated age in no acute distress.  ?HEENT: normocephalic atraumatic , Eyes: PERRL EOM's  full, conjunctiva clear, Nares: paten,t no deformity discharge or tenderness., Ears: no deformity EAC's clear TMs with normal landmarks. NECK: supple without masses, thyromegaly or bruits. ?CHEST/PULM:  Clear to auscultation and percussion breath sounds equal no wheeze , rales or rhonchi. No chest wall deformities or tenderness. ?Breast: rnormal by inspection . No dimpling, discharge, masses, tenderness or discharge . Left  reconstructed  ?CV: PMI is nondisplaced, S1 S2 no gallops, murmurs, rubs. Peripheral pulses are full without delay.No JVD .  ?ABDOMEN: Bowel sounds normal nontender  No guard or rebound, no hepato splenomegal no CVA tenderness.   ?Extremtities:  No clubbing cyanosis or edema, no acute joint swelling or redness no focal atrophy ?NEURO:  Oriented x3, cranial nerves 3-12 appear to be intact, no obvious focal weakness,gait within normal limits no abnormal reflexes or asymmetrical ?SKIN: No acute rashes normal turgor, color, no bruising or petechiae. ?PSYCH: Oriented, good eye contact, no obvious depression anxiety, cognition and judgment appear normal. ?LN: no cervical axillary inguinal adenopathy ?Pelvic: NL ext GU, labia clear without lesions or rash . Vagina no lesions .Cervix: clear  UTERUS: Neg CMT Adnexa:  clear no masses . PAP done with HPV cotesting ? ? ?Lab Results  ?Component Value Date  ? WBC 6.4 03/06/2020  ? HGB 13.2 03/06/2020  ? HCT 39.2 03/06/2020  ? PLT 352.0 03/06/2020  ? GLUCOSE 90 03/06/2020  ? CHOL 178 03/06/2020  ? TRIG 200.0 (H) 03/06/2020  ? HDL 42.60 03/06/2020  ? [place] 96 03/06/2020  ? ALT 23 03/06/2020  ? AST 21 03/06/2020  ? NA 138 03/06/2020  ? K 4.2 03/06/2020  ? CL 100 03/06/2020  ? CREATININE 0.69 03/06/2020  ? BUN 12 03/06/2020  ? CO2 32 03/06/2020  ? TSH 1.04 03/06/2020  ? INR 1.9  09/20/2015  ? HGBA1C 5.5 06/26/2015  ? ? ?BP Readings from Last 3 Encounters:  ?07/08/21 120/78  ?03/13/20 132/82  ?03/06/20 (!) 150/98  ? ? ?Fasting today  ? ?ASSESSMENT AND PLAN: ? ?Discussed the following assess

## 2021-07-08 ENCOUNTER — Ambulatory Visit (INDEPENDENT_AMBULATORY_CARE_PROVIDER_SITE_OTHER): Payer: BC Managed Care – PPO | Admitting: Internal Medicine

## 2021-07-08 ENCOUNTER — Encounter: Payer: Self-pay | Admitting: Internal Medicine

## 2021-07-08 ENCOUNTER — Other Ambulatory Visit (HOSPITAL_COMMUNITY)
Admission: RE | Admit: 2021-07-08 | Discharge: 2021-07-08 | Disposition: A | Discharge status: Home / Self Care | Payer: BC Managed Care – PPO | Source: Ambulatory Visit | Attending: Internal Medicine | Admitting: Internal Medicine

## 2021-07-08 VITALS — BP 120/78 | HR 75 | Temp 98.2°F | Ht 66.0 in | Wt 162.0 lb

## 2021-07-08 DIAGNOSIS — Z124 Encounter for screening for malignant neoplasm of cervix: Secondary | ICD-10-CM

## 2021-07-08 DIAGNOSIS — Z9889 Other specified postprocedural states: Secondary | ICD-10-CM

## 2021-07-08 DIAGNOSIS — Z853 Personal history of malignant neoplasm of breast: Secondary | ICD-10-CM

## 2021-07-08 DIAGNOSIS — I341 Nonrheumatic mitral (valve) prolapse: Secondary | ICD-10-CM

## 2021-07-08 DIAGNOSIS — I10 Essential (primary) hypertension: Secondary | ICD-10-CM

## 2021-07-08 DIAGNOSIS — Z0000 Encounter for general adult medical examination without abnormal findings: Secondary | ICD-10-CM | POA: Diagnosis not present

## 2021-07-08 DIAGNOSIS — Z01419 Encounter for gynecological examination (general) (routine) without abnormal findings: Secondary | ICD-10-CM

## 2021-07-08 DIAGNOSIS — Z79899 Other long term (current) drug therapy: Secondary | ICD-10-CM

## 2021-07-08 LAB — CBC WITH DIFFERENTIAL/PLATELET
Basophils Absolute: 0 10*3/uL (ref 0.0–0.1)
Basophils Relative: 0.4 % (ref 0.0–3.0)
Eosinophils Absolute: 0.2 10*3/uL (ref 0.0–0.7)
Eosinophils Relative: 2.6 % (ref 0.0–5.0)
HCT: 40 % (ref 36.0–46.0)
Hemoglobin: 13.6 g/dL (ref 12.0–15.0)
Lymphocytes Relative: 27.7 % (ref 12.0–46.0)
Lymphs Abs: 2 10*3/uL (ref 0.7–4.0)
MCHC: 34 g/dL (ref 30.0–36.0)
MCV: 86.9 fl (ref 78.0–100.0)
Monocytes Absolute: 0.6 10*3/uL (ref 0.1–1.0)
Monocytes Relative: 7.5 % (ref 3.0–12.0)
Neutro Abs: 4.6 10*3/uL (ref 1.4–7.7)
Neutrophils Relative %: 61.8 % (ref 43.0–77.0)
Platelets: 340 10*3/uL (ref 150.0–400.0)
RBC: 4.6 Mil/uL (ref 3.87–5.11)
RDW: 13.4 % (ref 11.5–15.5)
WBC: 7.4 10*3/uL (ref 4.0–10.5)

## 2021-07-08 LAB — COMPREHENSIVE METABOLIC PANEL
ALT: 45 U/L — ABNORMAL HIGH (ref 0–35)
AST: 35 U/L (ref 0–37)
Albumin: 4.5 g/dL (ref 3.5–5.2)
Alkaline Phosphatase: 100 U/L (ref 39–117)
BUN: 16 mg/dL (ref 6–23)
CO2: 31 mEq/L (ref 19–32)
Calcium: 9.7 mg/dL (ref 8.4–10.5)
Chloride: 101 mEq/L (ref 96–112)
Creatinine, Ser: 0.7 mg/dL (ref 0.40–1.20)
GFR: 91.53 mL/min (ref >60.00)
Glucose, Bld: 91 mg/dL (ref 70–99)
Potassium: 3.6 mEq/L (ref 3.5–5.1)
Sodium: 139 mEq/L (ref 135–145)
Total Bilirubin: 0.7 mg/dL (ref 0.2–1.2)
Total Protein: 7.4 g/dL (ref 6.0–8.3)

## 2021-07-08 LAB — LIPID PANEL
Cholesterol: 143 mg/dL (ref 0–200)
HDL: 45.4 mg/dL (ref >39.00)
LDL Cholesterol: 80 mg/dL (ref 0–99)
NonHDL: 97.9
Total CHOL/HDL Ratio: 3
Triglycerides: 91 mg/dL (ref 0.0–149.0)
VLDL: 18.2 mg/dL (ref 0.0–40.0)

## 2021-07-08 LAB — TSH: TSH: 0.92 u[IU]/mL (ref 0.35–5.50)

## 2021-07-08 NOTE — Patient Instructions (Signed)
Good to see you today  ?Exam is good .  ? ? ?Yearly check  ?Continue lifestyle intervention healthy eating and exercise .  ? ? ?

## 2021-07-08 NOTE — Progress Notes (Signed)
Laboratory results were normal except a borderline elevated in transaminase l that could be from the liver  a nonspecific(I believe you had this before a while back )and pretty common . ?Advise Tracy Chen repeat LFTs panel and Hep C aby in 3 months .  Dx abnormal transaminase  ?In meantime limit  avoid alcohol advil  aleve type meds and tylenol

## 2021-07-11 ENCOUNTER — Telehealth: Payer: Self-pay

## 2021-07-11 LAB — CYTOLOGY - PAP
Comment: NEGATIVE
Diagnosis: NEGATIVE
High risk HPV: NEGATIVE

## 2021-07-11 NOTE — Telephone Encounter (Signed)
Called Cytology and spoke with Tracy Chen and informed of adding HPV screening to lab and that I had called yesterday and spoke with someone who said they would add HPV screening and fax over paperwork I informed her we never received it and she then stated she could take a verbal order to add HPV screening to lab.  ?

## 2021-07-12 NOTE — Progress Notes (Signed)
Pap normal  hpv negative

## 2021-09-04 ENCOUNTER — Other Ambulatory Visit: Payer: Self-pay | Admitting: Cardiovascular Disease

## 2021-09-12 ENCOUNTER — Telehealth: Payer: Self-pay | Admitting: Cardiovascular Disease

## 2021-09-12 MED ORDER — POTASSIUM CHLORIDE CRYS ER 20 MEQ PO TBCR: 20.0000 meq | EXTENDED_RELEASE_TABLET | Freq: Every day | ORAL | 1 refills | Status: DC

## 2021-09-12 NOTE — Telephone Encounter (Signed)
*  STAT* If patient is at the pharmacy, call can be transferred to refill team.   1. Which medications need to be refilled? (please list name of each medication and dose if known) potassium chloride SA (KLOR-CON M) 20 MEQ tablet  2. Which pharmacy/location (including street and city if local pharmacy) is medication to be sent to? [place], [place] [address]  3. Do they need a 30 day or 90 day supply? 90   Patient isnt due for a appt until 03/2022. Please advise

## 2021-09-12 NOTE — Telephone Encounter (Signed)
Pt's medication was sent to pt's pharmacy as requested. Confirmation received.  °

## 2022-01-18 ENCOUNTER — Other Ambulatory Visit: Payer: Self-pay | Admitting: Cardiovascular Disease

## 2022-02-19 ENCOUNTER — Other Ambulatory Visit: Payer: Self-pay

## 2022-02-19 MED ORDER — HYDROCHLOROTHIAZIDE 25 MG PO TABS: 25.0000 mg | ORAL_TABLET | Freq: Every day | ORAL | 1 refills | Status: DC

## 2022-03-15 ENCOUNTER — Other Ambulatory Visit: Payer: Self-pay | Admitting: Cardiovascular Disease

## 2022-03-31 ENCOUNTER — Encounter: Payer: Self-pay | Admitting: Cardiovascular Disease

## 2022-03-31 NOTE — Progress Notes (Signed)
Cardiology Office Note   Date:  04/01/2022   ID:  Tracy Chen, Tracy Chen 1955/07/05, MRN [id number]  PCP:  Burnis Medin, MD  Cardiologist:   Mertie Moores, MD   Chief Complaint  Patient presents with   mitral valve repair    Problem List 1. 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 MV repair , June 28, 2015 3. Breast cancer . Left breast, s/p chemo and XRT   Tracy L Flinchum  [facility] ee) is a 67 y.o. female who presents for evaluation of a heart murmur. She was recently found have a heart murmur on exam. She had an echo cardiogram which revealed mitral valve prolapse involving the posterior leaflet. She has associated severe mitral regurgitation.   no hx of murmur in the past.  No CP or dyspnea.   Exercises regularly without problems.  No limitations  August 13, 2015:  Tracy Chen is seen today for follow up of her MV repair ( June 28, 2015)  Healing up .  Feeling some better Trying to exercise.    HR is fast  Has a "white cloud" over her left eye for a few minutes over Memorial day  No other neurologic deficits.    September 27, 2015:    Doing great.  S/p MV repair. Did not Take her metoprolol this point. Her heart rate is a little elevated as result.  Sept. 24, 2018:  She saw Tracy Chen at her last visit . She continues to do well. Follow-up echocardiogram in August, 2018 revealed normal left ventricle systolic function.  Dec. 19, 2019: Doing well  No cp or dyspnea  Not exercising much    Jan. 4, 2022:  Tracy Chen is seen today for follow up of her mitral valve repair. Getting some exercise   Takes SBE prophylaxis prior to dental work   Jan. 23, 2024 Tracy  [facility] ee)  is seen today for follow up of her MV repair  No CP , no dyspnea  Exercising some   Has voltage criteria for LVH ( appears a bit more prominent than last year.    Soft systolic murmur Will get an echo and see her in a year     Past Medical History:  Diagnosis Date   Allergic rhinitis    Breast cancer ([place])     left   Gallstone    History of chickenpox    as a child   Hx of valvular heart disease 02/21/2015   MVP with MR >> s/p min inv MV repair // Echo 8/18: EF 50-55, s/p MV repair with mild residual MR, mod to severe LAE, mild RAE   HX: breast cancer    hx of 2002 left er/pr 3.2cm   Osteoporosis    L5   Personal history of chemotherapy 2002   Personal history of radiation therapy 2002   S/P minimally invasive mitral valve repair 06/28/2015   Complex valvuloplasty including triangular resection of flail segment of posterior leaflet, artificial Gore-tex neochord placement x4 and 32 mm Sorin Memo 3D ring annuloplasty via right mini-thoracotomy incision    Past Surgical History:  Procedure Laterality Date   abd [place]     gallstones CT gallstones 1/06   BREAST LUMPECTOMY Left 2002   CARDIAC CATHETERIZATION N/A 05/29/2015   Procedure: Right/Left Heart Cath and Coronary Angiography;  Surgeon: Peter M Martinique, MD;  Location: [place] CV LAB;  Service: Cardiovascular;  Laterality: N/A;   COLONOSCOPY     MITRAL VALVE REPAIR Right  06/28/2015   Procedure: MINIMALLY INVASIVE MITRAL VALVE REPAIR (MVR);  Surgeon: Rexene Alberts, MD;  Location: [place];  Service: Open Heart Surgery;  Laterality: Right;  48 Sorin Memo 3D Ring   PARATHYROIDECTOMY  11/06   left inferior   partial left mastectomy      TEE WITHOUT CARDIOVERSION N/A 04/25/2015   Procedure: TRANSESOPHAGEAL ECHOCARDIOGRAM (TEE);  Surgeon: Thayer Headings, MD;  Location: [place];  Service: Cardiovascular;  Laterality: N/A;   TEE WITHOUT CARDIOVERSION N/A 06/28/2015   Procedure: TRANSESOPHAGEAL ECHOCARDIOGRAM (TEE);  Surgeon: Rexene Alberts, MD;  Location: [place];  Service: Open Heart Surgery;  Laterality: N/A;     Current Outpatient Medications  Medication Sig Dispense Refill   aspirin EC 81 MG tablet Take 1 tablet (81 mg total) by mouth daily.     Cholecalciferol (VITAMIN D3) 125 MCG (5000 UT) CAPS Take 1 capsule by mouth daily.      clindamycin (CLEOCIN) 150 MG capsule Take 4 capsules one hour prior to all dental visits. 8 capsule 3   hydrochlorothiazide (HYDRODIURIL) 25 MG tablet Take 1 tablet (25 mg total) by mouth daily. 30 tablet 1   metoprolol tartrate (LOPRESSOR) 25 MG tablet Take 1 tablet (25 mg total) by mouth 2 (two) times daily. 180 tablet 3   potassium chloride SA (KLOR-CON M) 20 MEQ tablet Take 1 tablet by mouth once daily 90 tablet 0   No current facility-administered medications for this visit.    Allergies:   Penicillins    Social History:  The patient  reports that she has never smoked. She has never used smokeless tobacco. She reports that she does not drink alcohol and does not use drugs.   Family History:  The patient's family history includes Colon cancer in her maternal aunt and mother; Heart murmur in her brother; Osteoporosis in her maternal aunt; Thyroid disease in an other family member; Valvular heart disease in her father.    ROS:  Please see the history of present illness.   Physical Exam: Blood pressure 116/70, pulse 74, height '5\' 6"'$  (1.676 m), weight 165 lb 3.2 oz (74.9 kg), SpO2 99 %.      GEN:  Well nourished, well developed in no acute distress HEENT: Normal NECK: No JVD; No carotid bruits LYMPHATICS: No lymphadenopathy CARDIAC: RRR , soft systolic murmur  RESPIRATORY:  Clear to auscultation without rales, wheezing or rhonchi  ABDOMEN: Soft, non-tender, non-distended MUSCULOSKELETAL:  No edema; No deformity  SKIN: Warm and dry NEUROLOGIC:  Alert and oriented x 3    EKG: April 01, 2022:   Normal sinus rhythm at 74.  Moderate voltage criteria for left ventricular hypertrophy.  The LVH changes are more prominent compared to last year.     Recent Labs: 07/08/2021: ALT 45; BUN 16; Creatinine, Ser 0.70; Hemoglobin 13.6; Platelets 340.0; Potassium 3.6; Sodium 139; TSH 0.92    Lipid Panel    Component Value Date/Time   CHOL 143 07/08/2021 1221   TRIG 91.0 07/08/2021 1221    TRIG 62 12/26/2005 1000   HDL 45.40 07/08/2021 1221   CHOLHDL 3 07/08/2021 1221   VLDL 18.2 07/08/2021 1221   LDLCALC 80 07/08/2021 1221      Wt Readings from Last 3 Encounters:  04/01/22 165 lb 3.2 oz (74.9 kg)  07/08/21 162 lb (73.5 kg)  03/13/20 162 lb (73.5 kg)      Other studies Reviewed: Additional studies/ records that were reviewed today include: . Review of the above records demonstrates:  ASSESSMENT AND PLAN:  1.  MVP, / MR: She status post mitral valve repair in 2018.  Overall her valves still sounds good.  She does have a soft systolic murmur.  She has moderate voltage criteria for left ventricular hypertrophy.  Will recheck an echocardiogram.   2. Tachycardia -she is no longer having any episodes of tachycardia.  Continue current medications.     3.  HTN:    Blood pressure is well-controlled.  Continue current medications.     Current medicines are reviewed at length with the patient today.  The patient does not have concerns regarding medicines.  The following changes have been made:  no change  Labs/ tests ordered today include:   Orders Placed This Encounter  Procedures   EKG 12-Lead   ECHOCARDIOGRAM COMPLETE     Mertie Moores, MD  04/01/2022 12:57 PM    [facility] [place], [place], [place]  [address] Phone: [phone]; Fax: [phone]

## 2022-04-01 ENCOUNTER — Ambulatory Visit: Payer: BC Managed Care – PPO | Attending: Cardiovascular Disease | Admitting: Cardiovascular Disease

## 2022-04-01 ENCOUNTER — Encounter: Payer: Self-pay | Admitting: Cardiovascular Disease

## 2022-04-01 VITALS — BP 116/70 | HR 74 | Ht 66.0 in | Wt 165.2 lb

## 2022-04-01 DIAGNOSIS — Z9889 Other specified postprocedural states: Secondary | ICD-10-CM | POA: Diagnosis not present

## 2022-04-01 DIAGNOSIS — I059 Rheumatic mitral valve disease, unspecified: Secondary | ICD-10-CM | POA: Diagnosis not present

## 2022-04-01 NOTE — Patient Instructions (Signed)
Medication Instructions:  Your physician recommends that you continue on your current medications as directed. Please refer to the Current Medication list given to you today.  *If you need a refill on your cardiac medications before your next appointment, please call your pharmacy*   Lab Work: none If you have labs (blood work) drawn today and your tests are completely normal, you will receive your results only by: [place] (if you have MyChart) OR A paper copy in the mail If you have any lab test that is abnormal or we need to change your treatment, we will call you to review the results.   Testing/Procedures: Your physician has requested that you have an echocardiogram. Echocardiography is a painless test that uses sound waves to create images of your heart. It provides your doctor with information about the size and shape of your heart and how well your heart's chambers and valves are working. This procedure takes approximately one hour. There are no restrictions for this procedure. Please do NOT wear cologne, perfume, aftershave, or lotions (deodorant is allowed). Please arrive 15 minutes prior to your appointment time.    Follow-Up: At [facility], you and your health needs are our priority.  As part of our continuing mission to provide you with exceptional heart care, we have created designated Provider Care Teams.  These Care Teams include your primary Cardiologist (physician) and Advanced Practice Providers (APPs -  Physician Assistants and Nurse Practitioners) who all work together to provide you with the care you need, when you need it.  We recommend signing up for the patient portal called "MyChart".  Sign up information is provided on this After Visit Summary.  MyChart is used to connect with patients for Virtual Visits (Telemedicine).  Patients are able to view lab/test results, encounter notes, upcoming appointments, etc.  Non-urgent messages can be sent to your  provider as well.   To learn more about what you can do with MyChart, go to [email or web address].    Your next appointment:   12 month(s)  Provider:   Mertie Moores, MD     Other Instructions

## 2022-04-24 ENCOUNTER — Other Ambulatory Visit: Payer: Self-pay | Admitting: Cardiovascular Disease

## 2022-04-24 ENCOUNTER — Ambulatory Visit (HOSPITAL_COMMUNITY): Payer: BC Managed Care – PPO | Attending: Cardiovascular Disease

## 2022-04-24 DIAGNOSIS — I059 Rheumatic mitral valve disease, unspecified: Secondary | ICD-10-CM | POA: Diagnosis not present

## 2022-04-24 DIAGNOSIS — Z9889 Other specified postprocedural states: Secondary | ICD-10-CM | POA: Diagnosis not present

## 2022-04-24 LAB — ECHOCARDIOGRAM COMPLETE
Area-P 1/2: 3.21 cm2
P 1/2 time: 513 msec
S' Lateral: 2.6 cm

## 2022-05-27 ENCOUNTER — Other Ambulatory Visit: Payer: Self-pay | Admitting: *Deleted

## 2022-05-27 MED ORDER — HYDROCHLOROTHIAZIDE 25 MG PO TABS: 25.0000 mg | ORAL_TABLET | Freq: Every day | ORAL | 3 refills | Status: DC

## 2022-05-27 NOTE — Telephone Encounter (Signed)
Received refill request for Hydrochlorothiazide 25mg  tabs to [facility]. 90 day supply.

## 2022-06-16 ENCOUNTER — Other Ambulatory Visit: Payer: Self-pay | Admitting: Cardiovascular Disease

## 2022-06-27 ENCOUNTER — Other Ambulatory Visit: Payer: Self-pay

## 2022-06-27 MED ORDER — METOPROLOL TARTRATE 25 MG PO TABS: 25.0000 mg | ORAL_TABLET | Freq: Two times a day (BID) | ORAL | 2 refills | Status: DC

## 2022-06-27 MED ORDER — POTASSIUM CHLORIDE CRYS ER 20 MEQ PO TBCR: 20.0000 meq | EXTENDED_RELEASE_TABLET | Freq: Every day | ORAL | 2 refills | Status: DC

## 2022-06-27 MED ORDER — HYDROCHLOROTHIAZIDE 25 MG PO TABS: 25.0000 mg | ORAL_TABLET | Freq: Every day | ORAL | 2 refills | Status: DC

## 2022-07-14 ENCOUNTER — Other Ambulatory Visit: Payer: Self-pay | Admitting: Cardiovascular Disease

## 2022-08-25 ENCOUNTER — Other Ambulatory Visit: Payer: Self-pay | Admitting: Internal Medicine

## 2022-08-25 DIAGNOSIS — Z1231 Encounter for screening mammogram for malignant neoplasm of breast: Secondary | ICD-10-CM

## 2022-08-28 ENCOUNTER — Ambulatory Visit
Admission: RE | Admit: 2022-08-28 | Discharge: 2022-08-28 | Disposition: A | Discharge status: Home / Self Care | Payer: Medicare Other | Source: Ambulatory Visit | Attending: Internal Medicine | Admitting: Internal Medicine

## 2022-08-28 DIAGNOSIS — Z1231 Encounter for screening mammogram for malignant neoplasm of breast: Secondary | ICD-10-CM

## 2022-09-24 NOTE — Progress Notes (Unsigned)
Chief Complaint  Patient presents with   Annual Exam    HPI: Patient  Tracy Chen  67 y.o. comes in today for yearly visit  initial medicare visit   On going monitoring problems  Hx min invasive MVrepeaire follow dr Elease Hashimoto  every 2 year fu  Hx of breast cancer  reg monitoring  HT on meds and reading are  at goal at home  hydrochlorothiazide metoprolol and potassium  Yearly cards  ADLs nl  Hearing  ok VISION  ok   Health Maintenance  Topic Date Due   Medicare Annual Wellness (AWV)  Never done   COVID-19 Vaccine (3 - Moderna risk series) 10/11/2022 (Originally 08/03/2019)   Pneumonia Vaccine 69+ Years old (1 of 1 - PCV) 09/25/2023 (Originally 04/23/2022)   HIV Screening  09/25/2023 (Originally 04/23/1970)   INFLUENZA VACCINE  10/09/2022   Colonoscopy  05/22/2024   PAP SMEAR-Modifier  07/08/2024   MAMMOGRAM  08/27/2024   DTaP/Tdap/Td (4 - Td or Tdap) 12/17/2028   DEXA SCAN  Completed   Hepatitis C Screening  Completed   Zoster Vaccines- Shingrix  Completed   HPV VACCINES  Aged Out   Health Maintenance Review LIFESTYLE:  Exercise:  light exercises  some weights.  Tobacco/ETS: no Alcohol:  no Sugar beverages: no Sleep: 7-8  Drug use: no HH of  2 + son and d in low  temporarily  .   No pets  in her half.  Work: 8 per day .     ROS:  no cp sob   syncope   GEN/ HEENT: No fever, significant weight changes sweats headaches vision problems hearing changes, CV/ PULM; No chest pain shortness of breath cough, syncope,edema  change in exercise tolerance. GI /GU: No adominal pain, vomiting, change in bowel habits.  No significant GU symptoms. SKIN/HEME: ,no acute skin rashes suspicious lesions or bleeding. No lymphadenopathy, nodules, masses.  NEURO/ PSYCH:  No neurologic signs such as weakness numbness. No depression anxiety. IMM/ Allergy: No unusual infections.  Allergy .   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/28/2015   Complex valvuloplasty including triangular resection of flail segment of posterior leaflet, artificial Gore-tex neochord placement x4 and 32 mm Sorin Memo 3D ring annuloplasty via right mini-thoracotomy incision    Past Surgical History:  Procedure Laterality Date   abd [place]     gallstones CT gallstones 1/06   BREAST LUMPECTOMY Left 2002   CARDIAC CATHETERIZATION N/A 05/29/2015   Procedure: Right/Left Heart Cath and Coronary Angiography;  Surgeon: Peter M Swaziland, MD;  Location: [facility];  Service: Cardiovascular;  Laterality: N/A;   COLONOSCOPY     MITRAL VALVE REPAIR Right 06/28/2015   Procedure: MINIMALLY INVASIVE MITRAL VALVE REPAIR (MVR);  Surgeon: Purcell Nails, MD;  Location: [facility] OR;  Service: Open Heart Surgery;  Laterality: Right;  32 Sorin Memo 3D Ring   PARATHYROIDECTOMY  11/06   left inferior   partial left mastectomy      TEE WITHOUT CARDIOVERSION N/A 04/25/2015   Procedure: TRANSESOPHAGEAL ECHOCARDIOGRAM (TEE);  Surgeon: Vesta Mixer, MD;  Location: [facility];  Service: Cardiovascular;  Laterality: N/A;   TEE WITHOUT CARDIOVERSION N/A 06/28/2015   Procedure: TRANSESOPHAGEAL ECHOCARDIOGRAM (TEE);  Surgeon: Purcell Nails, MD;  [facility];  Service: Open Heart Surgery;  Laterality: N/A;    Family History  Problem Relation Age of Onset   Osteoporosis Maternal Aunt    Colon cancer Maternal Aunt    Thyroid disease Other    Heart murmur Brother        Picked up on exam  followed by echo.   Colon cancer Mother    Valvular heart disease Father        followed     Social History   Socioeconomic History   Marital status: Married    Spouse name: Not on file   Number of children: Not on file   Years of education: Not on file   Highest education level: Not on file  Occupational History   Not on file  Tobacco Use   Smoking status: Never   Smokeless tobacco: Never  Vaping Use   Vaping status: Never Used  Substance and Sexual Activity   Alcohol use: No    Alcohol/week: 0.0 standard drinks of alcohol   Drug use: No   Sexual activity: Not on file  Other Topics Concern   Not on file  Social History Narrative   Regular exercise- some   HH of 3     Pets outside goats and chicken.   1 cat    No ets.   Now working in [employer] office . 40 hours  Stopped second job At [employer]   26+   Neg ets   G2P2   6.5-7 hours    Neg ta caffiene .    Social Determinants of Health   Financial Resource Strain: Not on file  Food Insecurity: Not on file  Transportation Needs: No Transportation Needs (09/25/2022)   PRAPARE - Administrator, Civil Service (Medical): No    Lack of Transportation (Non-Medical): No  Physical Activity: Insufficiently Active (09/25/2022)   Exercise Vital Sign    Days of Exercise per Week: 5 days    Minutes of Exercise per Session: 10 min  Stress: No Stress Concern Present (09/25/2022)   [employer] of Occupational Health - Occupational Stress Questionnaire    Feeling of Stress : Not at all  Social Connections: Socially Integrated (09/25/2022)   Social Connection and Isolation Panel [NHANES]    Frequency of Communication with Friends and Family: Three times a week    Frequency of Social Gatherings with Friends and Family: Twice a week    Attends Religious Services: More than 4 times per year    Active Member of Clubs or Organizations: Yes    Attends Engineer, structural: More than 4 times per year    Marital  Status: Married    Outpatient Medications Prior to Visit  Medication Sig Dispense Refill   aspirin EC 81 MG tablet Take 1 tablet (81 mg total) by mouth daily.     clindamycin (CLEOCIN) 150 MG capsule Take 4 capsules one hour prior to all dental visits. 8 capsule 3   hydrochlorothiazide (HYDRODIURIL) 25  MG tablet Take 1 tablet (25 mg total) by mouth daily. 90 tablet 2   metoprolol tartrate (LOPRESSOR) 25 MG tablet Take 1 tablet (25 mg total) by mouth 2 (two) times daily. 180 tablet 2   potassium chloride SA (KLOR-CON M20) 20 MEQ tablet Take 1 tablet (20 mEq total) by mouth daily. 90 tablet 2   Cholecalciferol (VITAMIN D3) 125 MCG (5000 UT) CAPS Take 1 capsule by mouth daily. (Patient not taking: Reported on 09/25/2022)     No facility-administered medications prior to visit.     EXAM:  BP 132/86 (BP Location: Right Arm, Cuff Size: Normal)   Pulse 78   Temp 97.9 F (36.6 C) (Oral)   Ht 5' 6.25" (1.683 m)   Wt 159 lb 12.8 oz (72.5 kg)   SpO2 96%   BMI 25.60 kg/m   Body mass index is 25.6 kg/m. Wt Readings from Last 3 Encounters:  09/25/22 159 lb 12.8 oz (72.5 kg)  04/01/22 165 lb 3.2 oz (74.9 kg)  07/08/21 162 lb (73.5 kg)    Physical Exam: Vital signs reviewed ZOX:WRUE is a well-developed well-nourished alert cooperative    who appearsr stated age in no acute distress.  HEENT: normocephalic atraumatic , Eyes: PERRL EOM's full, conjunctiva clear, Nares: paten,t no deformity discharge or tenderness., Ears: no deformity EAC's clear TMs with normal landmarks. Mouth: clear OP, no lesions, edema.  Moist mucous membranes. Dentition in adequate repair. NECK: supple without masses, thyromegaly or bruits. CHEST/PULM:  Clear to auscultation and percussion breath sounds equal no wheeze , rales or rhonchi. No chest wall deformities or tenderness. Breast: normal by inspection . R normal  left is reconstructed and no masses  CV: PMI is nondisplaced, S1 S2 no gallops, murmurs, rubs.  Peripheral pulses are full without delay.  ABDOMEN: Bowel sounds normal nontender  No guard or rebound, no hepato splenomegal no CVA tenderness.   Extremtities:  No clubbing cyanosis or edema, no acute joint swelling or redness no focal atrophy NEURO:  Oriented x3, cranial nerves 3-12 appear to be intact, no obvious focal weakness,gait within normal limits no abnormal reflexes or asymmetrical SKIN: No acute rashes normal turgor, color, no bruising or petechiae. PSYCH: Oriented, good eye contact, no obvious depression anxiety, cognition and judgment appear normal. LN: no cervical axillary iadenopathy  Lab Results  Component Value Date   WBC 7.6 09/25/2022   HGB 14.2 09/25/2022   HCT 42.3 09/25/2022   PLT 337.0 09/25/2022   GLUCOSE 92 09/25/2022   CHOL 164 09/25/2022   TRIG 96.0 09/25/2022   HDL 51.40 09/25/2022   LDLCALC 94 09/25/2022   ALT 19 09/25/2022   AST 18 09/25/2022   NA 141 09/25/2022   K 3.8 09/25/2022   CL 102 09/25/2022   CREATININE 0.66 09/25/2022   BUN 14 09/25/2022   CO2 29 09/25/2022   TSH 1.34 09/25/2022   INR 1.9 09/20/2015   HGBA1C 5.7 09/25/2022    BP Readings from Last 3 Encounters:  09/25/22 132/86  04/01/22 116/70  07/08/21 120/78    Lab plan reviewed with patient   ASSESSMENT AND PLAN:  Discussed the following assessment and plan:    ICD-10-CM   1. Essential hypertension  I10 Basic metabolic panel    CBC with Differential/Platelet    Hemoglobin A1c    Hepatic function panel    Lipid panel    TSH    Hepatitis C antibody    Hep B Surface Antibody    Hep B Surface Antigen  Vitamin D, 25-hydroxy    2. HX: breast cancer  Z85.3 Basic metabolic panel    CBC with Differential/Platelet    Hemoglobin A1c    Hepatic function panel    Lipid panel    TSH    Hepatitis C antibody    Hep B Surface Antibody    Hep B Surface Antigen    Vitamin D, 25-hydroxy    3. Medication management  Z79.899 Basic metabolic panel    CBC with  Differential/Platelet    Hemoglobin A1c    Hepatic function panel    Lipid panel    TSH    Hepatitis C antibody    Hep B Surface Antibody    Hep B Surface Antigen    Vitamin D, 25-hydroxy    4. S/P minimally invasive mitral valve repair  Z98.890 Basic metabolic panel    CBC with Differential/Platelet    Hemoglobin A1c    Hepatic function panel    Lipid panel    TSH    Hepatitis C antibody    Hep B Surface Antibody    Hep B Surface Antigen    Vitamin D, 25-hydroxy    5. TRANSAMINASES, SERUM, ELEVATED  R74.01 Basic metabolic panel   Z61.09 CBC with Differential/Platelet    Hemoglobin A1c    Hepatic function panel    Lipid panel    TSH    Hepatitis C antibody    Hep B Surface Antibody    Hep B Surface Antigen    Vitamin D, 25-hydroxy   ? fu from last year    6. Age-related osteoporosis without current pathological fracture  M81.0 Basic metabolic panel    CBC with Differential/Platelet    Hemoglobin A1c    Hepatic function panel    Lipid panel    TSH    Hepatitis C antibody    Hep B Surface Antibody    Hep B Surface Antigen    Vitamin D, 25-hydroxy    DG Bone Density    7. Estrogen deficiency  E28.39 DG Bone Density    Prevnar 20 at pharmacy  decline in office today . Make sure bp control at home  Reviewed  osteoporosis   lsi  Ordered  dexa scan and fu as inidcated   consider other intervention if sig decline   Return in about 1 year (around 09/25/2023) for depending on results.  Patient Care Team: Aquiles Ruffini, Neta Mends, MD as PCP - General Nahser, Deloris Ping, MD as PCP - Cardiology (Cardiology) Iva Boop, MD (Gastroenterology) Nahser, Deloris Ping, MD as Consulting Physician (Cardiology) Patient Instructions  Good to see you today.   Fasting lab  Make sure BP at goal  at home on meds ( 130/80  or at least below 140/90)   Advise updated bone denisity ( last one 2021 )   if dropping  consider other intervention in addition to weight bearing exercise . Adequate   vit d  .    Neta Mends. Darilyn Storbeck M.D.

## 2022-09-25 ENCOUNTER — Encounter: Payer: Self-pay | Admitting: Internal Medicine

## 2022-09-25 ENCOUNTER — Ambulatory Visit (INDEPENDENT_AMBULATORY_CARE_PROVIDER_SITE_OTHER): Payer: Medicare Other | Admitting: Internal Medicine

## 2022-09-25 VITALS — BP 132/86 | HR 78 | Temp 97.9°F | Ht 66.25 in | Wt 159.8 lb

## 2022-09-25 DIAGNOSIS — Z9889 Other specified postprocedural states: Secondary | ICD-10-CM | POA: Diagnosis not present

## 2022-09-25 DIAGNOSIS — I10 Essential (primary) hypertension: Secondary | ICD-10-CM | POA: Diagnosis not present

## 2022-09-25 DIAGNOSIS — Z79899 Other long term (current) drug therapy: Secondary | ICD-10-CM

## 2022-09-25 DIAGNOSIS — R7401 Elevation of levels of liver transaminase levels: Secondary | ICD-10-CM

## 2022-09-25 DIAGNOSIS — M810 Age-related osteoporosis without current pathological fracture: Secondary | ICD-10-CM

## 2022-09-25 DIAGNOSIS — Z853 Personal history of malignant neoplasm of breast: Secondary | ICD-10-CM

## 2022-09-25 DIAGNOSIS — E2839 Other primary ovarian failure: Secondary | ICD-10-CM

## 2022-09-25 DIAGNOSIS — R7402 Elevation of levels of lactic acid dehydrogenase (LDH): Secondary | ICD-10-CM

## 2022-09-25 DIAGNOSIS — Z0000 Encounter for general adult medical examination without abnormal findings: Secondary | ICD-10-CM

## 2022-09-25 LAB — CBC WITH DIFFERENTIAL/PLATELET
Basophils Absolute: 0 10*3/uL (ref 0.0–0.1)
Basophils Relative: 0.3 % (ref 0.0–3.0)
Eosinophils Absolute: 0.1 10*3/uL (ref 0.0–0.7)
Eosinophils Relative: 0.8 % (ref 0.0–5.0)
HCT: 42.3 % (ref 36.0–46.0)
Hemoglobin: 14.2 g/dL (ref 12.0–15.0)
Lymphocytes Relative: 18.5 % (ref 12.0–46.0)
Lymphs Abs: 1.4 10*3/uL (ref 0.7–4.0)
MCHC: 33.6 g/dL (ref 30.0–36.0)
MCV: 87.3 fl (ref 78.0–100.0)
Monocytes Absolute: 0.5 10*3/uL (ref 0.1–1.0)
Monocytes Relative: 6.2 % (ref 3.0–12.0)
Neutro Abs: 5.7 10*3/uL (ref 1.4–7.7)
Neutrophils Relative %: 74.2 % (ref 43.0–77.0)
Platelets: 337 10*3/uL (ref 150.0–400.0)
RBC: 4.85 Mil/uL (ref 3.87–5.11)
RDW: 13.3 % (ref 11.5–15.5)
WBC: 7.6 10*3/uL (ref 4.0–10.5)

## 2022-09-25 LAB — BASIC METABOLIC PANEL
BUN: 14 mg/dL (ref 6–23)
CO2: 29 mEq/L (ref 19–32)
Calcium: 10.6 mg/dL — ABNORMAL HIGH (ref 8.4–10.5)
Chloride: 102 mEq/L (ref 96–112)
Creatinine, Ser: 0.66 mg/dL (ref 0.40–1.20)
GFR: 92.05 mL/min (ref >60.00)
Glucose, Bld: 92 mg/dL (ref 70–99)
Potassium: 3.8 mEq/L (ref 3.5–5.1)
Sodium: 141 mEq/L (ref 135–145)

## 2022-09-25 LAB — LIPID PANEL
Cholesterol: 164 mg/dL (ref 0–200)
HDL: 51.4 mg/dL (ref >39.00)
LDL Cholesterol: 94 mg/dL (ref 0–99)
NonHDL: 112.71
Total CHOL/HDL Ratio: 3
Triglycerides: 96 mg/dL (ref 0.0–149.0)
VLDL: 19.2 mg/dL (ref 0.0–40.0)

## 2022-09-25 LAB — HEPATIC FUNCTION PANEL
ALT: 19 U/L (ref 0–35)
AST: 18 U/L (ref 0–37)
Albumin: 4.6 g/dL (ref 3.5–5.2)
Alkaline Phosphatase: 83 U/L (ref 39–117)
Bilirubin, Direct: 0.1 mg/dL (ref 0.0–0.3)
Total Bilirubin: 0.7 mg/dL (ref 0.2–1.2)
Total Protein: 7.4 g/dL (ref 6.0–8.3)

## 2022-09-25 LAB — VITAMIN D 25 HYDROXY (VIT D DEFICIENCY, FRACTURES): VITD: 36.03 ng/mL (ref 30.00–100.00)

## 2022-09-25 LAB — TSH: TSH: 1.34 u[IU]/mL (ref 0.35–5.50)

## 2022-09-25 LAB — HEMOGLOBIN A1C: Hgb A1c MFr Bld: 5.7 % (ref 4.6–6.5)

## 2022-09-25 NOTE — Patient Instructions (Addendum)
Good to see you today.   Fasting lab  Make sure BP at goal  at home on meds ( 130/80  or at least below 140/90)   Advise updated bone denisity ( last one 2021 )   if dropping  consider other intervention in addition to weight bearing exercise . Adequate  vit d  .

## 2022-09-26 LAB — HEPATITIS C ANTIBODY: Hepatitis C Ab: NONREACTIVE

## 2022-09-26 LAB — HEPATITIS B SURFACE ANTIBODY,QUALITATIVE: Hep B S Ab: NONREACTIVE

## 2022-09-26 LAB — HEPATITIS B SURFACE ANTIGEN: Hepatitis B Surface Ag: NONREACTIVE

## 2022-09-26 NOTE — Progress Notes (Signed)
Blood results good nl so far  . Borderline elevation of calcium seems clinically insignificant and can be related to fasting and blood drawing  will check yearly . Liver test better  serology pending expect to be normal

## 2022-09-29 NOTE — Progress Notes (Signed)
Hepatitis screen is negative  (normal)   no concerns

## 2023-03-02 ENCOUNTER — Other Ambulatory Visit: Payer: Self-pay | Admitting: Cardiovascular Disease

## 2023-03-18 ENCOUNTER — Telehealth: Payer: Self-pay

## 2023-03-18 NOTE — Progress Notes (Deleted)
 No chief complaint on file.   HPI: Patient  Tracy Chen  68 y.o. comes in today for Preventive Health Care visit   Health Maintenance  Topic Date Due   Medicare Annual Wellness (AWV)  Never done   COVID-19 Vaccine (3 - Moderna risk series) 08/03/2019   INFLUENZA VACCINE  10/09/2022   Pneumonia Vaccine 49+ Years old (1 of 2 - PCV) 09/25/2023 (Originally 04/24/1961)   HIV Screening  09/25/2023 (Originally 04/23/1970)   Colonoscopy  05/22/2024   MAMMOGRAM  08/27/2024   Cervical Cancer Screening (HPV/Pap Cotest)  07/09/2026   DTaP/Tdap/Td (4 - Td or Tdap) 12/17/2028   DEXA SCAN  Completed   Hepatitis C Screening  Completed   Zoster Vaccines- Shingrix  Completed   HPV VACCINES  Aged Out   Health Maintenance Review LIFESTYLE:  Exercise:   Tobacco/ETS: Alcohol:  Sugar beverages: Sleep: Drug use: no HH of  Work:    ROS:  GEN/ HEENT: No fever, significant weight changes sweats headaches vision problems hearing changes, CV/ PULM; No chest pain shortness of breath cough, syncope,edema  change in exercise tolerance. GI /GU: No adominal pain, vomiting, change in bowel habits. No blood in the stool. No significant GU symptoms. SKIN/HEME: ,no acute skin rashes suspicious lesions or bleeding. No lymphadenopathy, nodules, masses.  NEURO/ PSYCH:  No neurologic signs such as weakness numbness. No depression anxiety. IMM/ Allergy: No unusual infections.  Allergy .   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   Complex valvuloplasty including triangular resection of flail segment of posterior leaflet, artificial Gore-tex neochord placement x4 and 32 mm Sorin Memo 3D ring annuloplasty via right mini-thoracotomy incision    Past Surgical History:  Procedure Laterality Date   abd us      gallstones CT gallstones 1/06   BREAST LUMPECTOMY Left 2002   CARDIAC CATHETERIZATION N/A 05/29/2015   Procedure: Right/Left Heart Cath and Coronary Angiography;  Surgeon: Peter M Jordan, MD;  [facility]: [facility];  Service: Cardiovascular;  Laterality: N/A;   COLONOSCOPY     MITRAL VALVE REPAIR Right 06/28/2015   Procedure: MINIMALLY INVASIVE MITRAL VALVE REPAIR (MVR);  Surgeon: Sudie VEAR Laine, MD;  Location: [facility] OR;  Service: Open Heart Surgery;  Laterality: Right;  32 Sorin Memo 3D Ring   PARATHYROIDECTOMY  11/06   left inferior   partial left mastectomy      TEE WITHOUT CARDIOVERSION N/A 04/25/2015   Procedure: TRANSESOPHAGEAL ECHOCARDIOGRAM (TEE);  Surgeon: Aleene JINNY Passe, MD;  Location: [facility] ENDOSCOPY;  Service: Cardiovascular;  Laterality: N/A;   TEE WITHOUT CARDIOVERSION N/A 06/28/2015   Procedure: TRANSESOPHAGEAL ECHOCARDIOGRAM (TEE);  Surgeon: Sudie VEAR Laine, MD;  Location: [facility] OR;  Service: Open Heart Surgery;  Laterality: N/A;    Family History  Problem Relation Age  of Onset   Osteoporosis Maternal Aunt    Colon cancer Maternal Aunt    Thyroid  disease Other    Heart murmur Brother        Picked up on exam followed by echo.   Colon cancer Mother    Valvular heart disease Father        followed     Social History   Socioeconomic History   Marital status: Married    Spouse name: Not on file   Number of children: Not on file   Years of education: Not on file   Highest education level: Not on file  Occupational History   Not on file  Tobacco Use   Smoking status: Never   Smokeless tobacco: Never  Vaping Use   Vaping status:  Never Used  Substance and Sexual Activity   Alcohol use: No    Alcohol/week: 0.0 standard drinks of alcohol   Drug use: No   Sexual activity: Not on file  Other Topics Concern   Not on file  Social History Narrative   Regular exercise- some   HH of 3     Pets outside goats and chicken.   1 cat    No ets.   Now working in [employer] office . 40 hours  Stopped second job At [employer]   26+   Neg ets   G2P2   6.5-7 hours    Neg ta caffiene .    Social Drivers of Corporate Investment Banker Strain: Not on file  Food Insecurity: Not on file  Transportation Needs: No Transportation Needs (09/25/2022)   PRAPARE - Administrator, Civil Service (Medical): No    Lack of Transportation (Non-Medical): No  Physical Activity: Insufficiently Active (09/25/2022)   Exercise Vital Sign    Days of Exercise per Week: 5 days    Minutes of Exercise per Session: 10 min  Stress: No Stress Concern Present (09/25/2022)   [employer] of Occupational Health - Occupational Stress Questionnaire    Feeling of Stress : Not at all  Social Connections: Socially Integrated (09/25/2022)   Social Connection and Isolation Panel [NHANES]    Frequency of Communication with Friends and Family: Three times a week    Frequency of Social Gatherings with Friends and Family: Twice a week    Attends Religious Services: More than 4 times per year    Active Member of Clubs or Organizations: Yes    Attends Engineer, Structural: More than 4 times per year    Marital Status: Married    Outpatient Medications Prior to Visit  Medication Sig Dispense Refill   aspirin  EC 81 MG tablet Take 1 tablet (81 mg total) by mouth daily.     clindamycin  (CLEOCIN ) 150 MG capsule Take 4 capsules one hour prior to all dental visits. 8 capsule 3   hydrochlorothiazide  (HYDRODIURIL ) 25 MG tablet Take 1 tablet (25 mg total) by mouth daily. 90 tablet 2   metoprolol  tartrate (LOPRESSOR ) 25 MG tablet Take 1 tablet (25 mg  total) by mouth 2 (two) times daily. Please call [phone] to schedule an appointment for future refills. Thank you. 60 tablet 0   potassium chloride  SA (KLOR-CON  M20) 20 MEQ tablet Take 1 tablet (20 mEq total) by mouth daily. Please call [phone] to schedule an appointment for future refills. Thank you. 30 tablet 0   No facility-administered medications prior to visit.     EXAM:  There were no vitals taken for  this visit.  There is no height or weight on file to calculate BMI. Wt Readings from Last 3 Encounters:  09/25/22 159 lb 12.8 oz (72.5 kg)  04/01/22 165 lb 3.2 oz (74.9 kg)  07/08/21 162 lb (73.5 kg)    Physical Exam: Vital signs reviewed HZW:Uypd is a well-developed well-nourished alert cooperative    who appearsr stated age in no acute distress.  HEENT: normocephalic atraumatic , Eyes: PERRL EOM's full, conjunctiva clear, Nares: paten,t no deformity discharge or tenderness., Ears: no deformity EAC's clear TMs with normal landmarks. Mouth: clear OP, no lesions, edema.  Moist mucous membranes. Dentition in adequate repair. NECK: supple without masses, thyromegaly or bruits. CHEST/PULM:  Clear to auscultation and percussion breath sounds equal no wheeze , rales or rhonchi. No chest wall deformities or tenderness. Breast: normal by inspection . No dimpling, discharge, masses, tenderness or discharge . CV: PMI is nondisplaced, S1 S2 no gallops, murmurs, rubs. Peripheral pulses are full without delay.No JVD .  ABDOMEN: Bowel sounds normal nontender  No guard or rebound, no hepato splenomegal no CVA tenderness.  No hernia. Extremtities:  No clubbing cyanosis or edema, no acute joint swelling or redness no focal atrophy NEURO:  Oriented x3, cranial nerves 3-12 appear to be intact, no obvious focal weakness,gait within normal limits no abnormal reflexes or asymmetrical SKIN: No acute rashes normal turgor, color, no bruising or petechiae. PSYCH: Oriented, good eye contact, no  obvious depression anxiety, cognition and judgment appear normal. LN: no cervical axillary inguinal adenopathy  Lab Results  Component Value Date   WBC 7.6 09/25/2022   HGB 14.2 09/25/2022   HCT 42.3 09/25/2022   PLT 337.0 09/25/2022   GLUCOSE 92 09/25/2022   CHOL 164 09/25/2022   TRIG 96.0 09/25/2022   HDL 51.40 09/25/2022   LDLCALC 94 09/25/2022   ALT 19 09/25/2022   AST 18 09/25/2022   NA 141 09/25/2022   K 3.8 09/25/2022   CL 102 09/25/2022   CREATININE 0.66 09/25/2022   BUN 14 09/25/2022   CO2 29 09/25/2022   TSH 1.34 09/25/2022   INR 1.9 09/20/2015   HGBA1C 5.7 09/25/2022    BP Readings from Last 3 Encounters:  09/25/22 132/86  04/01/22 116/70  07/08/21 120/78    Lab results reviewed with patient   ASSESSMENT AND PLAN:  Discussed the following assessment and plan:  No diagnosis found. No follow-ups on file.  Patient Care Team: Amarian Botero, Apolinar POUR, MD as PCP - General Nahser, Aleene PARAS, MD as PCP - Cardiology (Cardiology) Avram Lupita BRAVO, MD (Gastroenterology) Nahser, Aleene PARAS, MD as Consulting Physician (Cardiology) There are no Patient Instructions on file for this visit.  Luellen Howson K. Exodus Kutzer M.D.

## 2023-03-18 NOTE — Telephone Encounter (Signed)
 Attempted to reach in regards to Welcome to [facility] appt tomorrow.   Left a detail message for pt to complete her questionnaire from [place] for visit tomorrow.

## 2023-03-19 ENCOUNTER — Encounter: Payer: Self-pay | Admitting: Internal Medicine

## 2023-03-19 ENCOUNTER — Ambulatory Visit (INDEPENDENT_AMBULATORY_CARE_PROVIDER_SITE_OTHER): Payer: Medicare Other | Admitting: Internal Medicine

## 2023-03-19 VITALS — BP 140/90 | HR 68 | Temp 97.5°F | Ht 66.0 in | Wt 163.6 lb

## 2023-03-19 DIAGNOSIS — Z0000 Encounter for general adult medical examination without abnormal findings: Secondary | ICD-10-CM

## 2023-03-19 DIAGNOSIS — Z79899 Other long term (current) drug therapy: Secondary | ICD-10-CM

## 2023-03-19 LAB — BASIC METABOLIC PANEL
BUN: 11 mg/dL (ref 6–23)
CO2: 31 meq/L (ref 19–32)
Calcium: 10 mg/dL (ref 8.4–10.5)
Chloride: 100 meq/L (ref 96–112)
Creatinine, Ser: 0.67 mg/dL (ref 0.40–1.20)
GFR: 91.41 mL/min (ref >60.00)
Glucose, Bld: 90 mg/dL (ref 70–99)
Potassium: 3.9 meq/L (ref 3.5–5.1)
Sodium: 138 meq/L (ref 135–145)

## 2023-03-19 NOTE — Progress Notes (Signed)
 Subjective:    Tracy Chen is a 68 y.o. female who presents for a Welcome to Medicare exam.  No Major change in health since last visit  Cardiac Risk Factors include: hypertension      Objective:    Today's Vitals   03/19/23 1100  BP: (!) 140/90  Pulse: 68  Temp: (!) 97.5 F (36.4 C)  TempSrc: Oral  SpO2: 98%  Weight: 163 lb 9.6 oz (74.2 kg)  Height: 5' 6 (1.676 m)  Body mass index is 26.41 kg/m.  Medications Outpatient Encounter Medications as of 03/19/2023  Medication Sig   aspirin  EC 81 MG tablet Take 1 tablet (81 mg total) by mouth daily.   clindamycin  (CLEOCIN ) 150 MG capsule Take 4 capsules one hour prior to all dental visits.   hydrochlorothiazide  (HYDRODIURIL ) 25 MG tablet Take 1 tablet (25 mg total) by mouth daily.   metoprolol  tartrate (LOPRESSOR ) 25 MG tablet Take 1 tablet (25 mg total) by mouth 2 (two) times daily. Please call [phone] to schedule an appointment for future refills. Thank you.   potassium chloride  SA (KLOR-CON  M20) 20 MEQ tablet Take 1 tablet (20 mEq total) by mouth daily. Please call [phone] to schedule an appointment for future refills. Thank you.   No facility-administered encounter medications on file as of 03/19/2023.     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   Complex valvuloplasty including triangular resection of flail segment of posterior leaflet, artificial Gore-tex neochord placement x4 and 32 mm Sorin Memo 3D ring annuloplasty  via right mini-thoracotomy incision   Past Surgical History:  Procedure Laterality Date   abd us      gallstones CT gallstones 1/06   BREAST LUMPECTOMY Left 2002   CARDIAC CATHETERIZATION N/A 05/29/2015   Procedure: Right/Left Heart Cath and Coronary Angiography;  Surgeon: Peter M Jordan, MD;  Location: [facility];  Service: Cardiovascular;  Laterality: N/A;   COLONOSCOPY     MITRAL VALVE REPAIR Right 06/28/2015   Procedure: MINIMALLY INVASIVE MITRAL VALVE REPAIR (MVR);  Surgeon: Sudie VEAR Laine, MD;  Location: [facility] OR;  Service: Open Heart Surgery;  Laterality: Right;  32 Sorin Memo 3D Ring   PARATHYROIDECTOMY  11/06   left inferior   partial left mastectomy      TEE WITHOUT CARDIOVERSION N/A 04/25/2015   Procedure: TRANSESOPHAGEAL ECHOCARDIOGRAM (TEE);  Surgeon: Aleene JINNY Passe, MD;  Location: [facility] ENDOSCOPY;  Service: Cardiovascular;  Laterality: N/A;   TEE WITHOUT CARDIOVERSION N/A 06/28/2015   Procedure: TRANSESOPHAGEAL ECHOCARDIOGRAM (TEE);  Surgeon: Sudie VEAR Laine, MD;  Location: [facility] OR;  Service: Open Heart Surgery;  Laterality: N/A;    Family History  Problem Relation Age of Onset   Osteoporosis Maternal Aunt    Colon cancer Maternal Aunt    Thyroid  disease Other    Heart murmur Brother  Picked up on exam followed by echo.   Colon cancer Mother    Valvular heart disease Father        followed    Social History   Occupational History   Not on file  Tobacco Use   Smoking status: Never   Smokeless tobacco: Never  Vaping Use   Vaping status: Never Used  Substance and Sexual Activity   Alcohol use: No   Drug use: No   Sexual activity: Not on file  HHof 4  some has pets   No tobacco   Tobacco Counseling Counseling given: Not Answered   Immunizations and Health Maintenance Immunization History  Administered Date(s) Administered   Influenza, Quadrivalent, Recombinant, Inj, Pf 12/18/2018   Influenza,inj,Quad PF,6+ Mos 12/31/2019   Influenza-Unspecified  12/18/2018   Moderna Sars-Covid-2 Vaccination 06/03/2019, 07/06/2019   Td 11/12/2004   Tdap 02/04/2016, 12/18/2018   Zoster Recombinant(Shingrix) 05/12/2020, 08/24/2020   There are no preventive care reminders to display for this patient.  Activities of Daily Living    03/19/2023   11:02 AM  In your present state of health, do you have any difficulty performing the following activities:  Hearing? 0  Vision? 0  Difficulty concentrating or making decisions? 0  Walking or climbing stairs? 0  Dressing or bathing? 0  Doing errands, shopping? 0  Preparing Food and eating ? N  Using the Toilet? N  In the past six months, have you accidently leaked urine? N  Do you have problems with loss of bowel control? N  Managing your Medications? N  Managing your Finances? N  Housekeeping or managing your Housekeeping? N    Physical Exam   Physical Exam (optional), or other factors deemed appropriate based on the beneficiary's medical and social history and current clinical standards. HEENT   tms clear eyes  no acute findings  Neck no masses  Cv rr no g or m noted  Abd no masses . Neuro grossly non focal   Advanced Directives: Does Patient Have a Medical Advance Directive?: Yes Type of Advance Directive: Living will  EKG:  Not done   sees cards   last echo 2 15 24        Assessment:    This is a routine wellness examination for this patient .   Vision/Hearing screen Hearing Screening   500Hz  1000Hz  2000Hz  4000Hz   Right ear Pass Pass Pass Pass  Left ear Fail Pass Pass Pass  Vision Screening - Comments:: Pt states she just had eye exam with Dr. Thom Hamilton. Will retrieve information from office.    Goals       Weight    Weight (lb) < 200 lb (90.7 kg)     Pt reports she would like to exercise more to lose about 10 lbs.        Depression Screen    03/19/2023   11:04 AM 09/25/2022   11:14 AM 03/06/2020   10:18 AM 01/18/2019    9:30 AM  PHQ 2/9 Scores  PHQ - 2 Score 0 0 0 0   PHQ- 9 Score   0   Exception Documentation   Medical reason      Fall Risk    03/19/2023   11:08 AM  Fall Risk   Falls in the past year? 0  Number falls in past yr: 0  Injury with Fall? 0  Risk for fall due to : No Fall Risks  Follow up Falls evaluation completed    Cognitive Function:  03/19/2023   11:10 AM  6CIT Screen  What Year? 0 points  What month? 0 points  What time? 0 points  Count back from 20 0 points  Months in reverse 0 points  Repeat phrase 2 points  Total Score 2 points    Patient Care Team: Sultan Pargas, Apolinar POUR, MD as PCP - General Nahser, Aleene PARAS, MD as PCP - Cardiology (Cardiology) Avram Lupita BRAVO, MD (Gastroenterology) Nahser, Aleene PARAS, MD as Consulting Physician (Cardiology) Glendia Simmonds, OD as Referring Physician (Optometry)     Plan:    Prenar 20 disc and to be given at her convenience  I have personally reviewed and noted the following in the patient's chart:   Medical and social history Use of alcohol, tobacco or illicit drugs  Current medications and supplements Functional ability and status Nutritional status Physical activity Advanced directives List of other physicians Hospitalizations, surgeries, and ER visits in previous 12 months Vitals Screenings to include cognitive, depression, and falls Referrals and appointments  In addition, I have reviewed and discussed with patient certain preventive protocols, quality metrics, and best practice recommendations. A written personalized care plan for preventive services as well as general preventive health recommendations were provided to patient.     Apolinar Eastern, MD 03/22/2023   Lab Results  Component Value Date   WBC 7.6 09/25/2022   HGB 14.2 09/25/2022   HCT 42.3 09/25/2022   PLT 337.0 09/25/2022   GLUCOSE 90 03/19/2023   CHOL 164 09/25/2022   TRIG 96.0 09/25/2022   HDL 51.40 09/25/2022   LDLCALC 94 09/25/2022   ALT 19 09/25/2022   AST 18 09/25/2022   NA 138 03/19/2023    K 3.9 03/19/2023   CL 100 03/19/2023   CREATININE 0.67 03/19/2023   BUN 11 03/19/2023   CO2 31 03/19/2023   TSH 1.34 09/25/2022   INR 1.9 09/20/2015   HGBA1C 5.7 09/25/2022

## 2023-03-19 NOTE — Patient Instructions (Signed)
 Good to see you today   Repeat calcium level in lab Continue lifestyle intervention healthy eating and exercise .  Get dexa when convenient .  Prevnar 20 when convenient   Ms. Tracy Chen , Thank you for taking time to come for your Medicare Wellness Visit. I appreciate your ongoing commitment to your health goals. Please review the following plan we discussed and let me know if I can assist you in the future.   These are the goals we discussed:  Goals       Weight    Weight (lb) < 200 lb (90.7 kg)     Pt reports she would like to exercise more to lose about 10 lbs.         This is a list of the screening recommended for you and due dates:  Health Maintenance  Topic Date Due   COVID-19 Vaccine (3 - Moderna risk series) 04/04/2023*   Flu Shot  06/08/2023*   Pneumonia Vaccine (1 of 2 - PCV) 09/25/2023*   HIV Screening  09/25/2023*   Medicare Annual Wellness Visit  03/18/2024   Colon Cancer Screening  05/22/2024   Mammogram  08/27/2024   Pap with HPV screening  07/09/2026   DTaP/Tdap/Td vaccine (4 - Td or Tdap) 12/17/2028   DEXA scan (bone density measurement)  Completed   Hepatitis C Screening  Completed   Zoster (Shingles) Vaccine  Completed   HPV Vaccine  Aged Out  *Topic was postponed. The date shown is not the original due date.

## 2023-03-22 ENCOUNTER — Encounter: Payer: Self-pay | Admitting: Internal Medicine

## 2023-03-22 NOTE — Progress Notes (Signed)
 Chemistry  calcium level is now normal . No other actions needed

## 2023-04-01 ENCOUNTER — Telehealth: Payer: Self-pay | Admitting: Cardiovascular Disease

## 2023-04-01 NOTE — Telephone Encounter (Signed)
*  STAT* If patient is at the pharmacy, call can be transferred to refill team.   1. Which medications need to be refilled? (please list name of each medication and dose if known) potassium chloride SA (KLOR-CON M20) 20 MEQ tablet   metoprolol tartrate (LOPRESSOR) 25 MG tablet  hydrochlorothiazide (HYDRODIURIL) 25 MG tablet  aspirin EC 81 MG tablet    2. Which pharmacy/location (including street and city if local pharmacy) is medication to be sent to? [facility] - [place], [place] - [address]   3. Do they need a 30 day or 90 day supply? 90

## 2023-04-02 MED ORDER — METOPROLOL TARTRATE 25 MG PO TABS: 25.0000 mg | ORAL_TABLET | Freq: Two times a day (BID) | ORAL | 0 refills | Status: DC

## 2023-04-02 MED ORDER — POTASSIUM CHLORIDE CRYS ER 20 MEQ PO TBCR: 20.0000 meq | EXTENDED_RELEASE_TABLET | Freq: Every day | ORAL | 0 refills | Status: DC

## 2023-04-02 MED ORDER — HYDROCHLOROTHIAZIDE 25 MG PO TABS: 25.0000 mg | ORAL_TABLET | Freq: Every day | ORAL | 0 refills | Status: DC

## 2023-05-24 ENCOUNTER — Encounter: Payer: Self-pay | Admitting: Cardiovascular Disease

## 2023-05-24 NOTE — Progress Notes (Unsigned)
 Cardiology Office Note   Date:  05/25/2023   ID:  Tracy, Chen Jul 02, 1955, MRN [id number]  PCP:  Tracy Headings, MD  Cardiologist:   Tracy Miss, MD   Chief Complaint  Patient presents with   MV repair   Problem List 1. 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 MV repair , June 28, 2015 3. Breast cancer . Left breast, s/p chemo and XRT   Tracy Chen  [facility] ee) is a 68 y.o. female who presents for evaluation of a heart murmur. She was recently found have a heart murmur on exam. She had an echo cardiogram which revealed mitral valve prolapse involving the posterior leaflet. She has associated severe mitral regurgitation.   no hx of murmur in the past.  No CP or dyspnea.   Exercises regularly without problems.  No limitations  August 13, 2015:  Tracy Chen is seen today for follow up of her MV repair ( June 28, 2015)  Healing up .  Feeling some better Trying to exercise.    HR is fast  Has a "white cloud" over her left eye for a few minutes over [facility] day  No other neurologic deficits.    September 27, 2015:    Doing great.  S/p MV repair. Did not Take her metoprolol this point. Her heart rate is a little elevated as result.  Sept. 24, 2018:  She saw Tracy Chen at her last visit . She continues to do well. Follow-up echocardiogram in August, 2018 revealed normal left ventricle systolic function.  Dec. 19, 2019: Doing well  No cp or dyspnea  Not exercising much    Jan. 4, 2022:  Tracy Chen is seen today for follow up of her mitral valve repair. Getting some exercise   Takes SBE prophylaxis prior to dental work   Jan. 23, 2024 Tracy  [facility] ee)  is seen today for follow up of her MV repair  No CP , no dyspnea  Exercising some   Has voltage criteria for LVH ( appears a bit more prominent than last year.    Soft systolic murmur Will get an echo and see her in a year   May 25, 2023 Tracy Chen is seen for follow up of her hx of MVP and  MV repair BP has been normal at  home  No CP , no dyspnea Is very active    Past Medical History:  Diagnosis Date   Allergic rhinitis    Breast cancer (HCC)    left   Gallstone    History of chickenpox    as a child   Hx of valvular heart disease 02/21/2015   MVP with MR >> s/p min inv MV repair // Echo 8/18: EF 50-55, s/p MV repair with mild residual MR, mod to severe LAE, mild RAE   HX: breast cancer    hx of 2002 left er/pr 3.2cm   Osteoporosis    L5   Personal history of chemotherapy 2002   Personal history of radiation therapy 2002   S/P minimally invasive mitral valve repair 06/28/2015   Complex valvuloplasty including triangular resection of flail segment of posterior leaflet, artificial Gore-tex neochord placement x4 and 32 mm Sorin Memo 3D ring annuloplasty via right mini-thoracotomy incision    Past Surgical History:  Procedure Laterality Date   abd [place]     gallstones CT gallstones 1/06   BREAST LUMPECTOMY Left 2002   CARDIAC CATHETERIZATION N/A 05/29/2015   Procedure: Right/Left Heart Cath  and Coronary Angiography;  Surgeon: Tracy M [place], MD;  Location: [facility] INVASIVE CV LAB;  Service: Cardiovascular;  Laterality: N/A;   COLONOSCOPY     MITRAL VALVE REPAIR Right 06/28/2015   Procedure: MINIMALLY INVASIVE MITRAL VALVE REPAIR (MVR);  Surgeon: Tracy Nails, MD;  Location: [facility] OR;  Service: Open Heart Surgery;  Laterality: Right;  32 Sorin Memo 3D Ring   PARATHYROIDECTOMY  11/06   left inferior   partial left mastectomy      TEE WITHOUT CARDIOVERSION N/A 04/25/2015   Procedure: TRANSESOPHAGEAL ECHOCARDIOGRAM (TEE);  Surgeon: Tracy Mixer, MD;  Location: [facility] ENDOSCOPY;  Service: Cardiovascular;  Laterality: N/A;   TEE WITHOUT CARDIOVERSION N/A 06/28/2015   Procedure: TRANSESOPHAGEAL ECHOCARDIOGRAM (TEE);  Surgeon: Tracy Nails, MD;  Location: [facility] OR;  Service: Open Heart Surgery;  Laterality: N/A;     Current Outpatient Medications  Medication Sig Dispense Refill   aspirin EC 81 MG tablet Take 1  tablet (81 mg total) by mouth daily.     clindamycin (CLEOCIN) 150 MG capsule Take 4 capsules one hour prior to all dental visits. 8 capsule 3   hydrochlorothiazide (HYDRODIURIL) 25 MG tablet Take 1 tablet (25 mg total) by mouth daily. 90 tablet 0   losartan (COZAAR) 25 MG tablet Take 1 tablet (25 mg total) by mouth daily. 90 tablet 3   metoprolol tartrate (LOPRESSOR) 25 MG tablet Take 1 tablet (25 mg total) by mouth 2 (two) times daily. Pt must keep upcoming followup appt with Cardiology in March 2025 for any more refills. 1st attempt Thank You 180 tablet 0   potassium chloride SA (KLOR-CON M20) 20 MEQ tablet Take 1 tablet (20 mEq total) by mouth daily. 90 tablet 0   No current facility-administered medications for this visit.    Allergies:   Penicillins    Social History:  The patient  reports that she has never smoked. She has never used smokeless tobacco. She reports that she does not drink alcohol and does not use drugs.   Family History:  The patient's family history includes Colon cancer in her maternal aunt and mother; Heart murmur in her brother; Osteoporosis in her maternal aunt; Thyroid disease in an other family member; Valvular heart disease in her father.    ROS:  Please see the history of present illness.     Physical Exam: Blood pressure (!) 142/80, pulse 70, height 5\' 7"  (1.702 m), weight 164 lb 6.4 oz (74.6 kg), SpO2 98%.  HYPERTENSION CONTROL Vitals:   05/25/23 1339 05/25/23 1411  BP: (!) 142/84 (!) 142/80    The patient's blood pressure is elevated above target today.  In order to address the patient's elevated BP: Blood pressure will be monitored at home to determine if medication changes need to be made.       GEN:  Well nourished, well developed in no acute distress HEENT: Normal NECK: No JVD; No carotid bruits LYMPHATICS: No lymphadenopathy CARDIAC: RRR  no murmurs   RESPIRATORY:  Clear to auscultation without rales, wheezing or rhonchi  ABDOMEN:  Soft, non-tender, non-distended MUSCULOSKELETAL:  No edema; No deformity  SKIN: Warm and dry NEUROLOGIC:  Alert and oriented x 3     EKG:     EKG Interpretation Date/Time:  Monday May 25 2023 13:42:19 EDT Ventricular Rate:  70 PR Interval:  194 QRS Duration:  108 QT Interval:  432 QTC Calculation: 466 R Axis:   7  Text Interpretation: Normal sinus rhythm Normal ECG When compared with  ECG of 29-Jun-2015 06:58, ST no longer elevated in Lateral leads Confirmed by Tracy Chen [phone]) on 05/25/2023 2:07:21 PM       Recent Labs: 09/25/2022: ALT 19; Hemoglobin 14.2; Platelets 337.0; TSH 1.34 03/19/2023: BUN 11; Creatinine, Ser 0.67; Potassium 3.9; Sodium 138    Lipid Panel    Component Value Date/Time   CHOL 164 09/25/2022 1143   TRIG 96.0 09/25/2022 1143   TRIG 62 12/26/2005 1000   HDL 51.40 09/25/2022 1143   CHOLHDL 3 09/25/2022 1143   VLDL 19.2 09/25/2022 1143   LDLCALC 94 09/25/2022 1143      Wt Readings from Last 3 Encounters:  05/25/23 164 lb 6.4 oz (74.6 kg)  03/19/23 163 lb 9.6 oz (74.2 kg)  09/25/22 159 lb 12.8 oz (72.5 kg)      Other studies Reviewed: Additional studies/ records that were reviewed today include: . Review of the above records demonstrates:    ASSESSMENT AND PLAN:  1.  MVP, / MR:  s/p MV repair.  Her valve sounds great .  Cont current meds.    2. Tachycardia -  better on the metoprolol      3.  HTN:     BP is mildly dilated today .   We discussed salt restriction, increasing exercise,  Will add Losartan 25 mg a day  Check BMP in 2-3 weeks  She will check BP periodically    Current medicines are reviewed at length with the patient today.  The patient does not have concerns regarding medicines.  The following changes have been made:  no change  Labs/ tests ordered today include:   Orders Placed This Encounter  Procedures   Basic metabolic panel   EKG 12-Lead     Tracy Miss, MD  05/25/2023 5:32 PM    [facility] [address] [place], [place], [place]  [address] Phone: [phone]; Fax: [phone]

## 2023-05-25 ENCOUNTER — Encounter: Payer: Self-pay | Admitting: Cardiovascular Disease

## 2023-05-25 ENCOUNTER — Ambulatory Visit: Payer: BLUE CROSS/BLUE SHIELD | Attending: Cardiovascular Disease | Admitting: Cardiovascular Disease

## 2023-05-25 VITALS — BP 142/80 | HR 70 | Ht 67.0 in | Wt 164.4 lb

## 2023-05-25 DIAGNOSIS — I10 Essential (primary) hypertension: Secondary | ICD-10-CM | POA: Insufficient documentation

## 2023-05-25 DIAGNOSIS — I340 Nonrheumatic mitral (valve) insufficiency: Secondary | ICD-10-CM | POA: Diagnosis present

## 2023-05-25 DIAGNOSIS — Z79899 Other long term (current) drug therapy: Secondary | ICD-10-CM | POA: Insufficient documentation

## 2023-05-25 DIAGNOSIS — I341 Nonrheumatic mitral (valve) prolapse: Secondary | ICD-10-CM | POA: Insufficient documentation

## 2023-05-25 MED ORDER — LOSARTAN POTASSIUM 25 MG PO TABS: 25.0000 mg | ORAL_TABLET | Freq: Every day | ORAL | 3 refills | Status: AC

## 2023-05-25 NOTE — Patient Instructions (Signed)
 Medication Instructions:  START Losartan 25mg  daily *If you need a refill on your cardiac medications before your next appointment, please call your pharmacy*  Lab Work: BMET in 2-3 weeks If you have labs (blood work) drawn today and your tests are completely normal, you will receive your results only by: MyChart Message (if you have MyChart) OR A paper copy in the mail If you have any lab test that is abnormal or we need to change your treatment, we will call you to review the results.  Follow-Up: At [facility] you and your health needs are our priority.  As part of our continuing mission to provide you with exceptional heart care, we have created designated Provider Care Teams.  These Care Teams include your primary Cardiologist (physician) and Advanced Practice Providers (APPs -  Physician Assistants and Nurse Practitioners) who all work together to provide you with the care you need, when you need it.  Your next appointment:   3 month(s)  Provider:   APP     1st Floor: - Lobby - Registration  - Pharmacy  - Lab - Cafe  2nd Floor: - PV Lab - Diagnostic Testing (echo, CT, nuclear med)  3rd Floor: - Vacant  4th Floor: - TCTS (cardiothoracic surgery) - AFib Clinic - Structural Heart Clinic - Vascular Surgery  - Vascular Ultrasound  5th Floor: - HeartCare Cardiology (general and EP) - Clinical Pharmacy for coumadin, hypertension, lipid, weight-loss medications, and med management appointments    Valet parking services will be available as well.

## 2023-06-10 ENCOUNTER — Encounter: Payer: Self-pay | Admitting: Cardiovascular Disease

## 2023-07-06 ENCOUNTER — Other Ambulatory Visit: Payer: Self-pay | Admitting: Cardiovascular Disease

## 2023-08-07 ENCOUNTER — Other Ambulatory Visit: Payer: Self-pay

## 2023-08-07 MED ORDER — POTASSIUM CHLORIDE CRYS ER 20 MEQ PO TBCR: 20.0000 meq | EXTENDED_RELEASE_TABLET | Freq: Every day | ORAL | 3 refills | Status: AC

## 2023-09-07 ENCOUNTER — Encounter: Payer: Self-pay | Admitting: Cardiovascular Disease

## 2023-09-07 ENCOUNTER — Telehealth: Payer: Self-pay | Admitting: Cardiovascular Disease

## 2023-09-07 MED ORDER — METOPROLOL TARTRATE 25 MG PO TABS: 25.0000 mg | ORAL_TABLET | Freq: Two times a day (BID) | ORAL | 0 refills | Status: AC

## 2023-09-07 MED ORDER — METOPROLOL TARTRATE 25 MG PO TABS: 25.0000 mg | ORAL_TABLET | Freq: Two times a day (BID) | ORAL | 2 refills | Status: DC

## 2023-09-07 NOTE — Addendum Note (Signed)
 Addended by: BLUFORD RAMP D on: 09/07/2023 04:13 PM   Modules accepted: Orders

## 2023-09-07 NOTE — Telephone Encounter (Signed)
 Pt's medication was sent to pt's pharmacy as requested. Confirmation received.

## 2023-09-07 NOTE — Telephone Encounter (Signed)
*  STAT* If patient is at the pharmacy, call can be transferred to refill team.   1. Which medications need to be refilled? (please list name of each medication and dose if known)   metoprolol  tartrate (LOPRESSOR ) 25 MG tablet   2. Would you like to learn more about the convenience, safety, & potential cost savings by using the [facility]?   3. Are you open to using the [facility] (Type [facility]. ).  4. Which pharmacy/location (including street and city if local pharmacy) is medication to be sent to?  [facility] - [place], [place] - [address]   5. Do they need a 30 day or 90 day supply?   Patient stated she would not received her prescription from [facility] for 7 days and wants an interim supply sent to her local pharmacy - [facility] - [place], [place] - [address] as she only has 2 tablets left.  Patient wants a call back to confirm prescription has been sent.

## 2023-09-07 NOTE — Telephone Encounter (Signed)
*  STAT* If patient is at the pharmacy, call can be transferred to refill team.   1. Which medications need to be refilled? (please list name of each medication and dose if known) Metoprolol    2. Would you like to learn more about the convenience, safety, & potential cost savings by using the [facility]?     3. Are you open to using the [facility] (Type [facility].   4. Which pharmacy/location (including street and city if local pharmacy) is medication to be sent to?[facility]   5. Do they need a 30 day or 90 day supply?  90 days and refills

## 2023-09-08 ENCOUNTER — Ambulatory Visit: Admitting: Cardiology

## 2023-09-21 ENCOUNTER — Encounter: Payer: Self-pay | Admitting: Internal Medicine

## 2023-10-20 ENCOUNTER — Other Ambulatory Visit: Payer: Self-pay | Admitting: Internal Medicine

## 2023-10-20 DIAGNOSIS — Z1231 Encounter for screening mammogram for malignant neoplasm of breast: Secondary | ICD-10-CM

## 2023-11-03 ENCOUNTER — Ambulatory Visit
Admission: RE | Admit: 2023-11-03 | Discharge: 2023-11-03 | Disposition: A | Discharge status: Home / Self Care | Source: Ambulatory Visit | Attending: Internal Medicine | Admitting: Internal Medicine

## 2023-11-03 DIAGNOSIS — Z1231 Encounter for screening mammogram for malignant neoplasm of breast: Secondary | ICD-10-CM

## 2023-11-12 ENCOUNTER — Telehealth: Payer: Self-pay | Admitting: *Deleted

## 2023-11-12 DIAGNOSIS — M810 Age-related osteoporosis without current pathological fracture: Secondary | ICD-10-CM

## 2023-11-12 DIAGNOSIS — Z13820 Encounter for screening for osteoporosis: Secondary | ICD-10-CM

## 2023-11-12 NOTE — Telephone Encounter (Signed)
 Copied from CRM #[id number]. Topic: Appointments - Scheduling Inquiry for Clinic >> Nov 12, 2023  3:45 PM Sasha M wrote: Reason for CRM: pt called to schedule bone density appt. She requests to be called at her work number at [phone]

## 2023-11-13 NOTE — Addendum Note (Signed)
 Addended by: Zissel Biederman on: 11/13/2023 05:05 PM   Modules accepted: Orders

## 2023-11-13 NOTE — Telephone Encounter (Signed)
 Please put in bone density order the last one was cancelled

## 2023-11-16 NOTE — Telephone Encounter (Signed)
 Lmom asking pt to callback to sch bone density test

## 2023-11-16 NOTE — Telephone Encounter (Signed)
 Pt has been sch for 11-20-2023 130 pm

## 2023-11-20 ENCOUNTER — Ambulatory Visit (INDEPENDENT_AMBULATORY_CARE_PROVIDER_SITE_OTHER)
Admission: RE | Admit: 2023-11-20 | Discharge: 2023-11-20 | Disposition: A | Discharge status: Home / Self Care | Source: Ambulatory Visit | Attending: Internal Medicine | Admitting: Internal Medicine

## 2023-11-20 ENCOUNTER — Inpatient Hospital Stay: Admission: RE | Admit: 2023-11-20 | Source: Ambulatory Visit

## 2023-11-20 DIAGNOSIS — Z13820 Encounter for screening for osteoporosis: Secondary | ICD-10-CM

## 2023-11-20 DIAGNOSIS — M810 Age-related osteoporosis without current pathological fracture: Secondary | ICD-10-CM | POA: Diagnosis not present

## 2023-12-23 ENCOUNTER — Ambulatory Visit: Payer: Self-pay | Admitting: Internal Medicine

## 2023-12-23 NOTE — Progress Notes (Signed)
 Bone denisty still osteoporosis and stable  we should make sure to discuss  at next visit . Due  by January  2026 make appt

## 2024-03-22 ENCOUNTER — Other Ambulatory Visit: Payer: Self-pay | Admitting: Physician Assistant

## 2024-03-23 MED ORDER — CLINDAMYCIN HCL 150 MG PO CAPS: ORAL_CAPSULE | ORAL | 0 refills | Status: AC

## 2024-03-25 ENCOUNTER — Telehealth: Payer: Self-pay | Admitting: Cardiology

## 2024-03-25 NOTE — Telephone Encounter (Signed)
" °*  STAT* If patient is at the pharmacy, call can be transferred to refill team.   1. Which medications need to be refilled? (please list name of each medication and dose if known)   clindamycin  (CLEOCIN ) 150 MG capsule    2. Which pharmacy/location (including street and city if local pharmacy) is medication to be sent to?  [facility] - [place], [place] - [address]      3. Do they need a 30 day or 90 day supply? 90 day    Pt is out of medication  "

## 2024-03-30 NOTE — Telephone Encounter (Signed)
 Pt's medication was sent to pt's pharmacy as requested. Confirmation received.

## 2024-05-13 ENCOUNTER — Ambulatory Visit

## 2024-05-24 ENCOUNTER — Ambulatory Visit: Admitting: Cardiology
